# Patient Record
Sex: Male | Born: 1955
Health system: Southern US, Community
[De-identification: ages and names within clinical notes are randomized; demographics above are authoritative.]

## PROBLEM LIST (undated history)

## (undated) DIAGNOSIS — E669 Obesity, unspecified: Secondary | ICD-10-CM

## (undated) HISTORY — DX: Obesity, unspecified: E66.9

## (undated) HISTORY — PX: HERNIA REPAIR: SHX51

## (undated) HISTORY — PX: TONSILLECTOMY: SUR1361

---

## 2014-11-08 HISTORY — PX: ANKLE FRACTURE SURGERY: SHX122

## 2019-11-09 DIAGNOSIS — C801 Malignant (primary) neoplasm, unspecified: Secondary | ICD-10-CM

## 2019-11-09 HISTORY — DX: Malignant (primary) neoplasm, unspecified: C80.1

## 2019-11-27 ENCOUNTER — Other Ambulatory Visit: Payer: Self-pay

## 2019-11-27 ENCOUNTER — Encounter: Payer: Self-pay | Admitting: Medical

## 2019-11-27 ENCOUNTER — Ambulatory Visit (INDEPENDENT_AMBULATORY_CARE_PROVIDER_SITE_OTHER): Payer: Medicare Other | Admitting: Medical

## 2019-11-27 VITALS — BP 132/84 | HR 62 | Temp 97.3°F | Ht 71.0 in | Wt 230.0 lb

## 2019-11-27 DIAGNOSIS — Z7189 Other specified counseling: Secondary | ICD-10-CM | POA: Diagnosis not present

## 2019-11-27 DIAGNOSIS — R591 Generalized enlarged lymph nodes: Secondary | ICD-10-CM

## 2019-11-27 DIAGNOSIS — Z7185 Encounter for immunization safety counseling: Secondary | ICD-10-CM

## 2019-11-27 DIAGNOSIS — Z1159 Encounter for screening for other viral diseases: Secondary | ICD-10-CM

## 2019-11-27 DIAGNOSIS — Z13220 Encounter for screening for lipoid disorders: Secondary | ICD-10-CM

## 2019-11-27 DIAGNOSIS — Z136 Encounter for screening for cardiovascular disorders: Secondary | ICD-10-CM

## 2019-11-27 DIAGNOSIS — K432 Incisional hernia without obstruction or gangrene: Secondary | ICD-10-CM

## 2019-11-27 DIAGNOSIS — Z125 Encounter for screening for malignant neoplasm of prostate: Secondary | ICD-10-CM | POA: Diagnosis not present

## 2019-11-27 DIAGNOSIS — Z113 Encounter for screening for infections with a predominantly sexual mode of transmission: Secondary | ICD-10-CM

## 2019-11-27 DIAGNOSIS — Z0000 Encounter for general adult medical examination without abnormal findings: Secondary | ICD-10-CM

## 2019-11-27 DIAGNOSIS — Z23 Encounter for immunization: Secondary | ICD-10-CM | POA: Diagnosis not present

## 2019-11-27 DIAGNOSIS — Z1211 Encounter for screening for malignant neoplasm of colon: Secondary | ICD-10-CM

## 2019-11-27 DIAGNOSIS — I8393 Asymptomatic varicose veins of bilateral lower extremities: Secondary | ICD-10-CM

## 2019-11-27 NOTE — Progress Notes (Signed)
Subjective:    Jorge Gould is a 64 y.o. male who presents for Preventative Services visit , new patient today.   Primary Care Provider Nakoa Ganus, Camelia Eng, PA-C here for primary care  No primary care in years. From [place], lives in [place] now.  Current Health Care Team:  Dentist, Dr. Danella Sensing  Eye doctor, none currently  Medical Services you may have received from other than [facility] providers in the past year (date may be approximate) No  Exercise Current exercise habits: The patient has a physically strenuous job, but has no regular exercise apart from work.    Nutrition/Diet Current diet: well balanced  Depression Screen Depression screen PHQ 2/9 11/27/2019  Decreased Interest 0  Down, Depressed, Hopeless 0  PHQ - 2 Score 0    Activities of Daily Living Screen/Functional Status Survey Is the patient deaf or have difficulty hearing?: Yes(a little bit) Does the patient have difficulty seeing, even when wearing glasses/contacts?: No Does the patient have difficulty concentrating, remembering, or making decisions?: No Does the patient have difficulty walking or climbing stairs?: No Does the patient have difficulty dressing or bathing?: No Does the patient have difficulty doing errands alone such as visiting a doctor's office or shopping?: No  Can patient draw a clock face showing 3:15 o'clock, yes   Fall Risk Screen  Fall Risk  11/27/2019  Falls in the past year? 0  Number falls in past yr: 0  Injury with Fall? 0    Gait Assessment: Normal gait observed yes  Advanced directives Does patient have a [place]? No Does patient have a Living Will? No  Past Medical History:  Diagnosis Date  . Obesity     Past Surgical History:  Procedure Laterality Date  . ANKLE FRACTURE SURGERY  2016   right, trauma repair after fall down stairs  . HERNIA REPAIR     umbilical  . TONSILLECTOMY      Social History   Socioeconomic History  . Marital  status: Married    Spouse name: Not on file  . Number of children: Not on file  . Years of education: Not on file  . Highest education level: Not on file  Occupational History  . Not on file  Tobacco Use  . Smoking status: Never Smoker  . Smokeless tobacco: Never Used  Substance and Sexual Activity  . Alcohol use: Yes    Alcohol/week: 3.0 standard drinks    Types: 3 Shots of liquor per week  . Drug use: Not Currently  . Sexual activity: Not on file  Other Topics Concern  . Not on file  Social History Narrative   Engaged, plan to marry in March 2021, been together since 2014.  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   Most exercise on the job, working 7 days per week.   No children, but fiance has 5 children.    11/2019   Social Determinants of Health   Financial Resource Strain:   . Difficulty of Paying Living Expenses: Not on file  Food Insecurity:   . Worried About Charity fundraiser in the Last Year: Not on file  . Ran Out of Food in the Last Year: Not on file  Transportation Needs:   . Lack of Transportation (Medical): Not on file  . Lack of Transportation (Non-Medical): Not on file  Physical Activity:   . Days of Exercise per Week: Not on file  . Minutes of Exercise per  Session: Not on file  Stress:   . Feeling of Stress : Not on file  Social Connections:   . Frequency of Communication with Friends and Family: Not on file  . Frequency of Social Gatherings with Friends and Family: Not on file  . Attends Religious Services: Not on file  . Active Member of Clubs or Organizations: Not on file  . Attends Archivist Meetings: Not on file  . Marital Status: Not on file  Intimate Partner Violence:   . Fear of Current or Ex-Partner: Not on file  . Emotionally Abused: Not on file  . Physically Abused: Not on file  . Sexually Abused: Not on file    Family History  Problem Relation Age of Onset  . Cancer Mother        lung; former smoker  .  COPD Father        emphysema  . Diabetes Maternal Aunt   . Heart disease Maternal Uncle   . Heart disease Maternal Uncle   . Diabetes Maternal Aunt   . Stroke Neg Hx   . Hyperlipidemia Neg Hx   . Hypertension Neg Hx     No current outpatient medications on file.  Not on File  History reviewed: allergies, current medications, past family history, past medical history, past social history, past surgical history and problem list  Chronic issues discussed: none  Acute issues discussed: Has lump of left neck x 6 months, not really changing.   Thought it was from tooth infection.    Feels healthy in general  Things he has had hernia at belly button  Last Td within 5 years  Every now and then if cough, gets pain under left rib cage.    Objective:      Biometrics BP 132/84   Pulse 62   Temp (!) 97.3 F (36.3 C)   Ht 5\' 11"  (1.803 m)   Wt 230 lb (104.3 kg)   BMI 32.08 kg/m   Cognitive Testing  Alert? Yes  Normal Appearance?Yes  Oriented to person? Yes  Place? Yes   Time? Yes  Recall of three objects?  Yes  Can perform simple calculations? Yes  Displays appropriate judgment?Yes  Can read the correct time from a watch face?Yes  General appearance: alert, no distress, WD/WN, white male  Nutritional Status: Inadequate calore intake? no Loss of muscle mass? no Loss of fat beneath skin? no Localized or general edema? no Diminished functional status? no  Other pertinent exam: HEENT: normocephalic, sclerae anicteric, TMs pearly, nares patent, no discharge or erythema, pharynx normal Oral cavity: MMM, no lesions Neck: supple, enlarged 3cm firm lymph node vs mass posterior to ankle of jaw on left/post auricular, nontender, otherwise  no lymphadenopathy, no thyromegaly, no masses Heart: RRR, normal S1, S2, no murmurs Lungs: CTA bilaterally, no wheezes, rhonchi, or rales Abdomen: +bs, soft, ventral surgical port scar and mild hernia in same area, mildly tender,  otherwise  non tender, non distended, no masses, no hepatomegaly, no splenomegaly Musculoskeletal: nontender, no swelling, no obvious deformity Extremities: moderate varicose veins throughout bilat upper and lower legs, otherwise no edema, no cyanosis, no clubbing Pulses: 2+ symmetric, upper and lower extremities, normal cap refill Neurological: alert, oriented x 3, CN2-12 intact, strength normal upper extremities and lower extremities, sensation normal throughout, DTRs 2+ throughout, no cerebellar signs, gait normal Psychiatric: normal affect, behavior normal, pleasant  GU: normal male external genitalia, circ, large varicole present left, otherwise no mass, no lymphadenopathy DRE: normal anus  tone, prostate mildly enlarged, no nodules  Skin: diffuse brown macules of torso anterior and posterior, no specific worrisome lesions   EKG  indication physical, screening for heart disease, rate 55 bpm, PR 142 ms, QRS 96 ms, QTC 426 ms, Axis XVIII degrees, sinus bradycardia, no prior to compare   Assessment:   Encounter Diagnoses  Name Primary?  . Encounter for health maintenance examination in adult Yes  . Medicare annual wellness visit, initial   . Screening for heart disease   . Need for pneumococcal vaccination   . Vaccine counseling   . Screening for lipid disorders   . Screen for STD (sexually transmitted disease)   . Lymphadenopathy   . Incisional hernia, without obstruction or gangrene   . Encounter for hepatitis C screening test for low risk patient   . Screening for prostate cancer   . Screen for colon cancer   . Varicose veins of both lower extremities, unspecified whether complicated      Plan:   A preventative services visit was completed today.  During the course of the visit today, we discussed and counseled about appropriate screening and preventive services.  A health risk assessment was established today that included a review of current medications, allergies, social  history, family history, medical and preventative health history, biometrics, and preventative screenings to identify potential safety concerns or impairments.  A personalized plan was printed today for your records and use.   Personalized health advice and education was given today to reduce health risks and promote self management and wellness.  Information regarding end of life planning was discussed today.  Conditions/risks identified: Lymphadenopathy, obesity, past due on cancer screenings   Chronic problems discussed today: None  Acute problems discussed today: Lymphadenopathy-pending labs consider CT scan neck  Skin lesions-advised dermatology consult for surveillance  Obesity counseled on need for weight loss through healthy diet and exercise  Incisional hernia-mild, may need to pursue surgery if this continues to give him problems   Recommendations:  I recommend a yearly ophthalmology/optometry visit for glaucoma screening and eye checkup  I recommended a yearly dental visit for hygiene and checkup  Advanced directives - discussed nature and purpose of Advanced Directives, encouraged them to complete them if they have not done so and/or encouraged them to get [place] a copy if they have done this already.  Referrals today: Plan for colonoscopy referral pending labs  Immunizations: He reports being up-to-date on tetanus vaccine within the last 5 years. He declines influenza vaccine. Counseled on the pneumococcal vaccine.  Vaccine information sheet given.  Pneumococcal vaccine Prevnar 13 given after consent obtained.  Counseled on Covid and Shingrix vaccine.  He will consider these  Cancer screening Pending labs plan for colonoscopy referral PSA screening prostate exam done today after risk and benefits discussed Discussed skin surveillance, routine labs   Zaid was seen today for annual exam.  Diagnoses and all orders for this visit:  Encounter for health  maintenance examination in adult -     Comprehensive metabolic panel -     CBC with Differential/Platelet -     Lipid panel -     PSA -     EKG 12-Lead -     Hepatitis C antibody -     HIV Antibody (routine testing w rflx) -     RPR -     GC/Chlamydia Probe Amp  Medicare annual wellness visit, initial  Screening for heart disease -     EKG 12-Lead  Need for pneumococcal vaccination -     Pneumococcal conjugate vaccine 13-valent  Vaccine counseling  Screening for lipid disorders -     Lipid panel  Screen for STD (sexually transmitted disease) -     Hepatitis C antibody -     HIV Antibody (routine testing w rflx) -     RPR -     GC/Chlamydia Probe Amp  Lymphadenopathy -     CBC with Differential/Platelet  Incisional hernia, without obstruction or gangrene  Encounter for hepatitis C screening test for low risk patient -     Hepatitis C antibody  Screening for prostate cancer -     PSA  Screen for colon cancer -     Ambulatory referral to Gastroenterology  Varicose veins of both lower extremities, unspecified whether complicated     Medicare Attestation A preventative services visit was completed today.  During the course of the visit the patient was educated and counseled about appropriate screening and preventive services.  A health risk assessment was established with the patient that included a review of current medications, allergies, social history, family history, medical and preventative health history, biometrics, and preventative screenings to identify potential safety concerns or impairments.  A personalized plan was printed today for the patient's records and use.   Personalized health advice and education was given today to reduce health risks and promote self management and wellness.  Information regarding end of life planning was discussed today.  Dorothea Ogle, PA-C   11/27/2019

## 2019-11-28 ENCOUNTER — Encounter: Payer: Self-pay | Admitting: Medical

## 2019-11-28 ENCOUNTER — Other Ambulatory Visit: Payer: Self-pay | Admitting: Medical

## 2019-11-28 DIAGNOSIS — Z113 Encounter for screening for infections with a predominantly sexual mode of transmission: Secondary | ICD-10-CM | POA: Diagnosis not present

## 2019-11-28 DIAGNOSIS — R591 Generalized enlarged lymph nodes: Secondary | ICD-10-CM

## 2019-11-28 DIAGNOSIS — Z0000 Encounter for general adult medical examination without abnormal findings: Secondary | ICD-10-CM | POA: Diagnosis not present

## 2019-11-28 LAB — COMPREHENSIVE METABOLIC PANEL
ALT: 9 IU/L (ref 0–44)
AST: 20 IU/L (ref 0–40)
Albumin/Globulin Ratio: 1.6 (ref 1.2–2.2)
Albumin: 4.4 g/dL (ref 3.8–4.8)
Alkaline Phosphatase: 104 IU/L (ref 39–117)
BUN/Creatinine Ratio: 16 (ref 10–24)
BUN: 15 mg/dL (ref 8–27)
Bilirubin Total: 0.6 mg/dL (ref 0.0–1.2)
CO2: 23 mmol/L (ref 20–29)
Calcium: 9.7 mg/dL (ref 8.6–10.2)
Chloride: 101 mmol/L (ref 96–106)
Creatinine, Ser: 0.92 mg/dL (ref 0.76–1.27)
GFR calc Af Amer: 101 mL/min/{1.73_m2} (ref >59)
GFR calc non Af Amer: 87 mL/min/{1.73_m2} (ref >59)
Globulin, Total: 2.7 g/dL (ref 1.5–4.5)
Glucose: 86 mg/dL (ref 65–99)
Potassium: 4.4 mmol/L (ref 3.5–5.2)
Sodium: 140 mmol/L (ref 134–144)
Total Protein: 7.1 g/dL (ref 6.0–8.5)

## 2019-11-28 LAB — CBC WITH DIFFERENTIAL/PLATELET
Basophils Absolute: 0 10*3/uL (ref 0.0–0.2)
Basos: 1 %
EOS (ABSOLUTE): 0.6 10*3/uL — ABNORMAL HIGH (ref 0.0–0.4)
Eos: 9 %
Hematocrit: 40.1 % (ref 37.5–51.0)
Hemoglobin: 13.9 g/dL (ref 13.0–17.7)
Immature Grans (Abs): 0.1 10*3/uL (ref 0.0–0.1)
Immature Granulocytes: 2 %
Lymphocytes Absolute: 1.2 10*3/uL (ref 0.7–3.1)
Lymphs: 19 %
MCH: 30.1 pg (ref 26.6–33.0)
MCHC: 34.7 g/dL (ref 31.5–35.7)
MCV: 87 fL (ref 79–97)
Monocytes Absolute: 0.5 10*3/uL (ref 0.1–0.9)
Monocytes: 8 %
Neutrophils Absolute: 3.7 10*3/uL (ref 1.4–7.0)
Neutrophils: 61 %
Platelets: 176 10*3/uL (ref 150–450)
RBC: 4.62 x10E6/uL (ref 4.14–5.80)
RDW: 13.2 % (ref 11.6–15.4)
WBC: 6.1 10*3/uL (ref 3.4–10.8)

## 2019-11-28 LAB — LIPID PANEL
Chol/HDL Ratio: 6.6 ratio — ABNORMAL HIGH (ref 0.0–5.0)
Cholesterol, Total: 212 mg/dL — ABNORMAL HIGH (ref 100–199)
HDL: 32 mg/dL — ABNORMAL LOW (ref >39)
LDL Chol Calc (NIH): 152 mg/dL — ABNORMAL HIGH (ref 0–99)
Triglycerides: 154 mg/dL — ABNORMAL HIGH (ref 0–149)
VLDL Cholesterol Cal: 28 mg/dL (ref 5–40)

## 2019-11-28 LAB — PSA: Prostate Specific Ag, Serum: 1.5 ng/mL (ref 0.0–4.0)

## 2019-11-28 LAB — HEPATITIS C ANTIBODY: Hep C Virus Ab: <0.1 s/co ratio (ref 0.0–0.9)

## 2019-11-28 LAB — RPR: RPR Ser Ql: NONREACTIVE

## 2019-11-28 LAB — HIV ANTIBODY (ROUTINE TESTING W REFLEX): HIV Screen 4th Generation wRfx: NONREACTIVE

## 2019-11-29 LAB — GC/CHLAMYDIA PROBE AMP
Chlamydia trachomatis, NAA: NEGATIVE
Neisseria Gonorrhoeae by PCR: NEGATIVE

## 2019-12-10 DIAGNOSIS — R590 Localized enlarged lymph nodes: Secondary | ICD-10-CM | POA: Diagnosis not present

## 2019-12-10 DIAGNOSIS — Z1211 Encounter for screening for malignant neoplasm of colon: Secondary | ICD-10-CM | POA: Diagnosis not present

## 2019-12-10 DIAGNOSIS — E782 Mixed hyperlipidemia: Secondary | ICD-10-CM | POA: Diagnosis not present

## 2020-01-07 ENCOUNTER — Ambulatory Visit
Admission: RE | Admit: 2020-01-07 | Discharge: 2020-01-07 | Disposition: A | Discharge status: Home / Self Care | Payer: Medicare Other | Source: Ambulatory Visit | Attending: Medical | Admitting: Medical

## 2020-01-07 DIAGNOSIS — R591 Generalized enlarged lymph nodes: Secondary | ICD-10-CM

## 2020-01-07 DIAGNOSIS — R599 Enlarged lymph nodes, unspecified: Secondary | ICD-10-CM | POA: Diagnosis not present

## 2020-01-07 MED ORDER — IOPAMIDOL (ISOVUE-300) INJECTION 61%
75.0000 mL | Freq: Once | INTRAVENOUS | Status: AC | PRN
  Administered 2020-01-07: 75 mL via INTRAVENOUS

## 2020-01-08 NOTE — Progress Notes (Signed)
Pt scheduled for tomorrow morning 

## 2020-01-09 ENCOUNTER — Ambulatory Visit (INDEPENDENT_AMBULATORY_CARE_PROVIDER_SITE_OTHER): Payer: Medicare Other | Admitting: Medical

## 2020-01-09 ENCOUNTER — Other Ambulatory Visit: Payer: Self-pay

## 2020-01-09 VITALS — BP 142/78 | HR 68 | Temp 97.8°F | Ht 71.5 in | Wt 222.4 lb

## 2020-01-09 DIAGNOSIS — R9389 Abnormal findings on diagnostic imaging of other specified body structures: Secondary | ICD-10-CM | POA: Diagnosis not present

## 2020-01-09 DIAGNOSIS — Z1211 Encounter for screening for malignant neoplasm of colon: Secondary | ICD-10-CM

## 2020-01-09 DIAGNOSIS — R591 Generalized enlarged lymph nodes: Secondary | ICD-10-CM

## 2020-01-09 DIAGNOSIS — E785 Hyperlipidemia, unspecified: Secondary | ICD-10-CM | POA: Insufficient documentation

## 2020-01-09 NOTE — Patient Instructions (Signed)
Lymphadenopathy  Lymphadenopathy means that your lymph glands are swollen or larger than normal (enlarged). Lymph glands, also called lymph nodes, are collections of tissue that filter bacteria, viruses, and waste from your bloodstream. They are part of your body's disease-fighting system (immune system), which protects your body from germs. There may be different causes of lymphadenopathy, depending on where it is in your body. Some types go away on their own. Lymphadenopathy can occur anywhere that you have lymph glands, including these areas:  Neck (cervical lymphadenopathy).  Chest (mediastinal lymphadenopathy).  Lungs (hilar lymphadenopathy).  Underarms (axillary lymphadenopathy).  Groin (inguinal lymphadenopathy). When your immune system responds to germs, infection-fighting cells and fluid build up in your lymph glands. This causes some swelling and enlargement. If the lymph glands do not go back to normal after you have an infection or disease, your health care provider may do tests. These tests help to monitor your condition and find the reason why the glands are still swollen and enlarged. Follow these instructions at home:  Get plenty of rest.  Take over-the-counter and prescription medicines only as told by your health care provider. Your health care provider may recommend over-the-counter medicines for pain.  If directed, apply heat to swollen lymph glands as often as told by your health care provider. Use the heat source that your health care provider recommends, such as a moist heat pack or a heating pad. ? Place a towel between your skin and the heat source. ? Leave the heat on for 20-30 minutes. ? Remove the heat if your skin turns bright red. This is especially important if you are unable to feel pain, heat, or cold. You may have a greater risk of getting burned.  Check your affected lymph glands every day for changes. Check other lymph gland areas as told by your  health care provider. Check for changes such as: ? More swelling. ? Sudden increase in size. ? Redness or pain. ? Hardness.  Keep all follow-up visits as told by your health care provider. This is important. Contact a health care provider if you have:  Swelling that gets worse or spreads to other areas.  Problems with breathing.  Lymph glands that: ? Are still swollen after 2 weeks. ? Have suddenly gotten bigger. ? Are red, painful, or hard.  A fever or chills.  Fatigue.  A sore throat.  Pain in your abdomen.  Weight loss.  Night sweats. Get help right away if you have:  Fluid leaking from an enlarged lymph gland.  Severe pain.  Chest pain.  Shortness of breath. Summary  Lymphadenopathy means that your lymph glands are swollen or larger than normal (enlarged).  Lymph glands (also called lymph nodes) are collections of tissue that filter bacteria, viruses, and waste from the bloodstream. They are part of your body's disease-fighting system (immune system).  Lymphadenopathy can occur anywhere that you have lymph glands.  If your enlarged and swollen lymph glands do not go back to normal after you have an infection or disease, your health care provider may do tests to monitor your condition and find the reason why the glands are still swollen and enlarged.  Check your affected lymph glands every day for changes. Check other lymph gland areas as told by your health care provider. This information is not intended to replace advice given to you by your health care provider. Make sure you discuss any questions you have with your health care provider. Document Revised: 10/07/2017 Document Reviewed: 09/09/2017 Elsevier  Patient Education  2020 [place], Adult  Hodgkin lymphoma, also called Hodgkin disease, is a cancer that affects the lymphatic system. The lymphatic system is part of the body's defense system (immune system), which protects the  body from infections, germs, and diseases. Hodgkin lymphoma often affects white blood cells and the lymph nodes. Hodgkin lymphoma can spread from lymph node to lymph node and to areas of the body where there is lymph tissue, including to the center of the bones (bone marrow). Advanced Hodgkin lymphoma can spread into blood vessels and be carried almost anywhere in the body. There are two types of Hodgkin lymphoma:  Classical Hodgkin lymphoma.  Nodular lymphocyte-predominant Hodgkin lymphoma (NLPHL). What are the causes? The cause is not known. What increases the risk? You may be more likely to develop Hodgkin lymphoma if:  You are male.  You have been infected with the virus that causes mononucleosis (Epstein-Barr virus).  You have a brother or sister with Hodgkin lymphoma.  You have a weakened immune system.  You have HIV.  You have a personal or family history of autoimmune conditions such as rheumatoid arthritis, systemic lupus erythematosus (SLE), or sarcoidosis. What are the signs or symptoms? The first sign of Hodgkin lymphoma is often a painless swelling in a lymph node. The swelling may be felt in the neck, under the arm, or in the groin. Other symptoms include:  Fever.  Drenching night sweats.  Feeling tired all the time.  Cough.  Shortness of breath.  Itchy skin.  Loss of appetite.  Weight loss.  Pain in the lymph nodes after drinking alcohol. How is this diagnosed? This condition may be diagnosed based on your medical history and symptoms, a physical exam, and a procedure in which a tissue sample is removed from a lymph node and then examined under a microscope (biopsy). You may also have other tests to find out how advanced the cancer is and whether it has spread. This is called staging. These tests may include:  Blood tests.  Imaging tests, such as: ? A chest X-ray. ? A CT scan. ? An MRI. ? A PET scan.  A bone marrow biopsy to see if the disease has  spread to the bone marrow. How is this treated? Your treatment will depend on the type of Hodgkin lymphoma you have, the stage of your cancer, your age, and your overall health. Treatment usually starts with one of the following:  Chemotherapy. Chemotherapy is the use of medicines to stop or slow the growth of cancer cells.  Radiation therapy. Radiation therapy is the use of high-energy X-rays to kill cancer cells.  A combination of chemotherapy and radiation therapy. If chemotherapy and radiation therapy are not completely successful, you may have treatment with:  Targeted therapy. This treatment targets specific parts of cancer cells and the area around them to block the growth and spread of cancer.  Very high doses of chemotherapy followed by a stem cell transplant. Stem cells are cells that help your body develop new healthy blood cells after chemotherapy. Follow these instructions at home: Eating and drinking  Do not take dietary supplements or herbal medicines unless your health care provider tells you to take them. Some supplements can interfere with how well the treatment works.  Try to eat regular, healthy meals. Some of your treatments might affect your appetite. Lifestyle   Make sure you are getting enough sleep on a regular basis. Most adults need 6-8 hours of  sleep each night. During treatment, you may need more sleep.  Consider joining a cancer support group. Ask your health care provider for more information about local and online support groups.  Do not use any products that contain nicotine or tobacco, such as cigarettes, e-cigarettes, and chewing tobacco. If you need help quitting, ask your health care provider. General instructions   Take over-the-counter and prescription medicines only as directed by your health care provider.  Keep all follow-up visits as told by your health care provider. This is important during and after treatment.  Cancer treatment may  increase your risk for other cancers and infections. After treatment: ? Make sure you get all vaccinations as recommended by your health care provider. ? Have regular cancer screenings as recommended by your health care provider. Where to find more information  American Cancer Society (ACS): www.cancer.org  Leukemia and Lymphoma Society (LLS): PreviewPal.pl  National Cancer Institute ([place]): www.cancer.gov Contact a health care provider if:  You have a fever or other signs of infection.  You develop new symptoms or your old symptoms come back. Get help right away if:  You have chest pain.  You have trouble breathing. Summary  Hodgkin lymphoma, also called Hodgkin disease, is a cancer that affects the lymphatic system.  The first sign of Hodgkin lymphoma is often a painless swelling in a lymph node. The swelling may be felt in the neck, under the arm, or in the groin.  Your treatment will depend on the type of cancer cells you have, the stage of your cancer, your age, and your overall health. Treatment may include a combination of chemotherapy and radiation therapy.  Keep all follow-up visits as told by your health care provider. This is important during and after treatment. This information is not intended to replace advice given to you by your health care provider. Make sure you discuss any questions you have with your health care provider. Document Revised: 05/31/2019 Document Reviewed: 08/10/2018 Elsevier Patient Education  [place].   Non-Hodgkin Lymphoma, Adult Non-Hodgkin lymphoma (NHL) is a cancer of the lymphatic system. The lymphatic system is part of the body's defense (immune) system. It protects the body from:  Infections.  Germs.  Diseases. NHL affects a type of white blood cell called lymphocytes. There are many different types of NHL. NHL can occur in the B lymphocytes (B cells), T lymphocytes (T cells), and natural killer (NK) cells. The kind of NHL  you have depends on the type of cells it affects and how quickly it grows and spreads. What are the causes? The cause of this condition is not known. What increases the risk? You are more likely to develop this condition if:  You have a weak immune system, especially after receiving an organ from a donor (transplant).  You have an autoimmune disease like rheumatoid arthritis.  You have infections from bacteria or viruses. These include: ? Human immunodeficiency virus (HIV). ? Epstein-Barr virus.  You are over the age of 82.  You are male.  You are Caucasian.  You have been treated with high-energy beams that destroy cancer cells(radiation therapy) for other cancers. What are the signs or symptoms? Symptoms of this condition depend on the type, where it is in the body, and whether it is fast-growing or slow-growing. Some people may not have any symptoms. Common symptoms include:  Swelling of the collections of tissue that filter bacteria, viruses, and waste from the bloodstream (lymph nodes) in your neck, armpits, or groin.  Fever.  Unexplained weight loss.  Night sweats.  Tiredness (fatigue). How is this diagnosed? This condition may be diagnosed based on:  A physical exam and medical history.  Chest X-ray.  Testing of: ? Tissue from your lymph gland or bone marrow (biopsy). ? Spinal fluid (lumbar puncture or spinal tap). ? Abdominal or chest fluid. ? Blood. ? Urine.  Imaging tests, such as: ? CT scan. ? PET scan (positron emission tomography). ? MRI. Your health care provider will do more tests to determine whether the cancer has spread, where it has spread to, and how severe it is. This is called staging. You may need to have other tests to look for certain proteins or gene mutations that may help with treatment planning. How is this treated? Treatment for this condition depends on your symptoms, the type and stage of NHL, and how fast it is spreading.  Treatment may include:  Radiation therapy.  Chemotherapy. This uses medicine to destroy cancer cells.  Biological therapy. This helps to control the spread of cancer by boosting the body's immune system.  Targeted medicines to treat specific proteins or gene mutations that are present in your lymphoma.  Bone marrow or stem cell transplantation. In this procedure, you receive high doses of chemotherapy followed by a healthy bone marrow or stem cell transplant from a donor.  Participating in clinical trials to see if new (experimental) treatments are effective.  Surgery to remove the lymphoma.  Blood or platelets from a donor (transfusion). This may be done if your blood counts are low. Follow these instructions at home: Medicines  Take over-the-counter and prescription medicines only as told by your health care provider.  Do not take dietary supplements or herbal medicines unless your health care provider tells you to take them. Some supplements can interfere with how well your treatment works. Lifestyle  Get enough sleep. Most adults need 6-8 hours of sleep each night. During treatment, you may need more sleep.  Consider joining a cancer support group. Ask your health care provider for more information about local and online support groups. General instructions   Drink enough fluid to keep your urine pale yellow.  Wash your hands often with soap and water. If soap and water are not available, use hand sanitizer.  Tell your cancer care team if you develop side effects from treatment. They may be able to recommend ways to manage them.  Keep all follow-up visits as told by your health care provider. This is important. Where to find more information  American Cancer Society: www.cancer.org  Leukemia and Lymphoma Society: PreviewPal.pl  National Cancer Institute ([place]): www.cancer.gov Contact a health care provider if you:  Have new lumps under your arms, on your neck, or near  your groin.  Have painful lymph nodes.  Have nausea or vomiting.  Have constipation or diarrhea.  Have trouble urinating or painful urination.  Have a skin rash.  Have abdominal pain.  Have joint pain.  Feel dizzy or light-headed.  Have a fever or chills. Get help right away if you:  Have trouble breathing or chest pain.  Have blood in your urine or stool. Summary  Non-Hodgkin lymphoma (NHL) is a cancer of the lymphatic system. The lymphatic system is part of your body's defense (immune) system.  Treatment for this condition depends on your symptoms, the type and stage of NHL, and how fast it is spreading.  Tell your cancer care team if you develop side effects from treatment. They may be able  to recommend ways to manage them.  Contact your health care provider if you have new lumps under your arms, on your neck, or near your groin. This information is not intended to replace advice given to you by your health care provider. Make sure you discuss any questions you have with your health care provider. Document Revised: 01/11/2019 Document Reviewed: 01/11/2019 Elsevier Patient Education  [place].

## 2020-01-09 NOTE — Addendum Note (Signed)
Addended by: Edgar Frisk on: 01/09/2020 01:19 PM   Modules accepted: Orders

## 2020-01-09 NOTE — Progress Notes (Signed)
Subjective: Chief Complaint  Patient presents with  . Follow-up    discuss ct results    Here for f/u to discuss recent CT neck and hx/o enlarged lymph node x 106mo.    No recent fever, weight loss, chills.   He notes he recently got married.  Wife is 66yo and wants to have a baby.  He wants to know where to get sperm count.   Past Medical History:  Diagnosis Date  . Obesity    No current outpatient medications on file prior to visit.   No current facility-administered medications on file prior to visit.   ROS as in subjective    Objective; BP (!) 142/78   Pulse 68   Temp 97.8 F (36.6 C)   Ht 5' 11.5" (1.816 m)   Wt 222 lb 6.4 oz (100.9 kg)   SpO2 97%   BMI 30.59 kg/m   Wt Readings from Last 3 Encounters:  01/09/20 222 lb 6.4 oz (100.9 kg)  11/27/19 230 lb (104.3 kg)   BP Readings from Last 3 Encounters:  01/09/20 (!) 142/78  11/27/19 132/84   Gen: wd, wn, nad, white male Neck: supple, enlarged 3cm firm lymph node vs mass posterior to angle of jaw on left/post auricular, other enlarged nodes palpated today in bilat neck, several more notice bale left anterior and posterior triangle, otherwise neck nontender, otherwise  no thyromegaly, no masses    Assessment: Encounter Diagnoses  Name Primary?  . Lymphadenopathy Yes  . Abnormal CT scan, neck   . Screen for colon cancer   . Dyslipidemia      Plan: lymphadenopathy - discussed recent CT neck that shows possible lymphoma vs other.  discussed possible diagnoses and next steps.   Urgent referral for lymph node biopsy with [facility] intervention[facility], and we will go ahead and set up oncology consult about 1.5 -2 weeks from now awaiting pathology.  He already had consult with Dr. Benson Norway, but colonoscopy will be deferred pending lymphadenopathy workup.  Dyslipidemia - briefly discussed his lipids, but we will put this on back burner temporarily  Fertility concern - I asked him to discuss with  oncology, but briefly discussed that sperm testing would likely be done either through wife's gynecology office or urology  Gehrig was seen today for follow-up.  Diagnoses and all orders for this visit:  Lymphadenopathy -     Ambulatory referral to Oncology -     [place] FNA SOFT TISSUE; Future  Abnormal CT scan, neck -     Ambulatory referral to Oncology -     [place] FNA SOFT TISSUE; Future  Screen for colon cancer  Dyslipidemia

## 2020-01-10 ENCOUNTER — Encounter (HOSPITAL_COMMUNITY): Payer: Self-pay

## 2020-01-10 NOTE — Progress Notes (Signed)
Heinz Knuckles Dozal Male, 64 y.o., [birth year] MRN:  [id number] Phone:  [phone] Jerilynn Mages) PCP:  Carlena Hurl, PA-C Coverage:  Sherre Poot [facility] Medicare/[employer] Medicare Next Appt With Radiology ([facility]-US 2) 01/21/2020 at 1:00 PM  RE: Biospy Received: Yesterday Message Contents  Markus Daft, MD  Ernestene Mention for US guided core biopsy of cervical lymphadenopathy.   [place]   Previous Messages  ----- Message -----  From: Lenore Cordia  Sent: 01/09/2020  3:00 PM EST  To: Ir Procedure Requests  Subject: Biospy                      Procedure Requested: [place] FNA    Reason for Procedure: lymphadneopathy, possible lymphoma, see abnormal CT    Provider Requesting: Carlena Hurl  Provider Telephone: [phone]    Other Info: Rad exam in Epic

## 2020-01-17 ENCOUNTER — Other Ambulatory Visit: Payer: Self-pay | Admitting: Radiology

## 2020-01-18 ENCOUNTER — Other Ambulatory Visit: Payer: Self-pay | Admitting: Student

## 2020-01-21 ENCOUNTER — Other Ambulatory Visit: Payer: Self-pay | Admitting: Medical

## 2020-01-21 ENCOUNTER — Ambulatory Visit (HOSPITAL_COMMUNITY)
Admission: RE | Admit: 2020-01-21 | Discharge: 2020-01-21 | Disposition: A | Discharge status: Home / Self Care | Payer: Medicare Other | Source: Ambulatory Visit | Attending: Medical | Admitting: Medical

## 2020-01-21 ENCOUNTER — Other Ambulatory Visit: Payer: Self-pay

## 2020-01-21 ENCOUNTER — Encounter (HOSPITAL_COMMUNITY): Payer: Self-pay

## 2020-01-21 DIAGNOSIS — R9389 Abnormal findings on diagnostic imaging of other specified body structures: Secondary | ICD-10-CM

## 2020-01-21 DIAGNOSIS — Z79899 Other long term (current) drug therapy: Secondary | ICD-10-CM | POA: Diagnosis not present

## 2020-01-21 DIAGNOSIS — C8510 Unspecified B-cell lymphoma, unspecified site: Secondary | ICD-10-CM | POA: Insufficient documentation

## 2020-01-21 DIAGNOSIS — C8591 Non-Hodgkin lymphoma, unspecified, lymph nodes of head, face, and neck: Secondary | ICD-10-CM | POA: Diagnosis not present

## 2020-01-21 DIAGNOSIS — C8590 Non-Hodgkin lymphoma, unspecified, unspecified site: Secondary | ICD-10-CM | POA: Insufficient documentation

## 2020-01-21 DIAGNOSIS — R590 Localized enlarged lymph nodes: Secondary | ICD-10-CM | POA: Diagnosis not present

## 2020-01-21 DIAGNOSIS — R591 Generalized enlarged lymph nodes: Secondary | ICD-10-CM

## 2020-01-21 MED ORDER — SODIUM CHLORIDE 0.9 % IV SOLN: INTRAVENOUS | Status: DC

## 2020-01-21 MED ORDER — FENTANYL CITRATE (PF) 100 MCG/2ML IJ SOLN
INTRAMUSCULAR | Status: AC
  Filled 2020-01-21: qty 4

## 2020-01-21 MED ORDER — MIDAZOLAM HCL 2 MG/2ML IJ SOLN
INTRAMUSCULAR | Status: AC | PRN
  Administered 2020-01-21 (×2): 1 mg via INTRAVENOUS

## 2020-01-21 MED ORDER — HYDROCODONE-ACETAMINOPHEN 5-325 MG PO TABS: 1.0000 | ORAL_TABLET | ORAL | Status: DC | PRN

## 2020-01-21 MED ORDER — NALOXONE HCL 0.4 MG/ML IJ SOLN
INTRAMUSCULAR | Status: AC
  Filled 2020-01-21: qty 1

## 2020-01-21 MED ORDER — FLUMAZENIL 1 MG/10ML IV SOLN
INTRAVENOUS | Status: AC
  Filled 2020-01-21: qty 10

## 2020-01-21 MED ORDER — FENTANYL CITRATE (PF) 100 MCG/2ML IJ SOLN
INTRAMUSCULAR | Status: AC | PRN
  Administered 2020-01-21 (×2): 50 ug via INTRAVENOUS

## 2020-01-21 MED ORDER — MIDAZOLAM HCL 2 MG/2ML IJ SOLN
INTRAMUSCULAR | Status: AC
  Filled 2020-01-21: qty 4

## 2020-01-21 MED ORDER — LIDOCAINE HCL (PF) 1 % IJ SOLN
INTRAMUSCULAR | Status: AC
  Filled 2020-01-21: qty 30

## 2020-01-21 NOTE — Discharge Instructions (Signed)

## 2020-01-21 NOTE — H&P (Signed)
Chief Complaint: Patient was seen in consultation today for left cervical lymph node biopsy at the request of Tysinger,David S  Referring Physician(s): Carlena Hurl  Supervising Physician: Arne Cleveland  Patient Status: [facility] - Out-pt  History of Present Illness: Jorge Gould is a 64 y.o. male   Pt has noticed Left cervical LN at least 6 mo Seems slightly smaller to him now No pain; no enlargement Non smoker  Was seen for PE and was noted on exam Referred to ENT  Imaging performed 3/1: CT Neck IMPRESSION: Cervical lymphadenopathy bilaterally, left greater than right. Numerous enlarged lymph nodes are present. Findings are suggestive of lymphoma and biopsy recommended. Bilateral parotid tumors as above. Favor incidental Warthin's tumor. Parotid malignancy is a consideration however the large number of lymph nodes is more consistent with lymphoma.  Now scheduled for biopsy  Past Medical History:  Diagnosis Date  . Obesity     Past Surgical History:  Procedure Laterality Date  . ANKLE FRACTURE SURGERY  2016   right, trauma repair after fall down stairs  . HERNIA REPAIR     umbilical  . TONSILLECTOMY      Allergies: Patient has no known allergies.  Medications: Prior to Admission medications   Medication Sig Start Date End Date Taking? Authorizing Provider  Naphazoline HCl (CLEAR EYES OP) Place 1 drop into both eyes daily as needed (redness).   Yes [provider]     Family History  Problem Relation Age of Onset  . Cancer Mother        lung; former smoker  . COPD Father        emphysema  . Diabetes Maternal Aunt   . Heart disease Maternal Uncle   . Heart disease Maternal Uncle   . Diabetes Maternal Aunt   . Stroke Neg Hx   . Hyperlipidemia Neg Hx   . Hypertension Neg Hx     Social History   Socioeconomic History  . Marital status: Married    Spouse name: Not on file  . Number of children: Not on file  . Years of  education: Not on file  . Highest education level: Not on file  Occupational History  . Not on file  Tobacco Use  . Smoking status: Never Smoker  . Smokeless tobacco: Never Used  Substance and Sexual Activity  . Alcohol use: Yes    Alcohol/week: 3.0 standard drinks    Types: 3 Shots of liquor per week  . Drug use: Not Currently  . Sexual activity: Not on file  Other Topics Concern  . Not on file  Social History Narrative   Engaged, plan to marry in March 2021, been together since 2014.  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   Most exercise on the job, working 7 days per week.   No children, but fiance has 5 children.    11/2019   Social Determinants of Health   Financial Resource Strain:   . Difficulty of Paying Living Expenses:   Food Insecurity:   . Worried About Charity fundraiser in the Last Year:   . Arboriculturist in the Last Year:   Transportation Needs:   . Film/video editor (Medical):   Marland Kitchen Lack of Transportation (Non-Medical):   Physical Activity:   . Days of Exercise per Week:   . Minutes of Exercise per Session:   Stress:   . Feeling of Stress :   Social Connections:   .  Frequency of Communication with Friends and Family:   . Frequency of Social Gatherings with Friends and Family:   . Attends Religious Services:   . Active Member of Clubs or Organizations:   . Attends Archivist Meetings:   Marland Kitchen Marital Status:      Review of Systems: A 12 point ROS discussed and pertinent positives are indicated in the HPI above.  All other systems are negative.  Review of Systems  Constitutional: Negative for activity change, fatigue, fever and unexpected weight change.  HENT: Negative for sore throat and trouble swallowing.   Respiratory: Negative for cough and shortness of breath.   Cardiovascular: Negative for chest pain.  Gastrointestinal: Negative for abdominal pain.  Psychiatric/Behavioral: Negative for behavioral problems and  confusion.    Vital Signs: There were no vitals taken for this visit.  Physical Exam Vitals reviewed.  HENT:     Head: Atraumatic.     Mouth/Throat:     Mouth: Mucous membranes are moist.  Neck:     Comments: Visible and palpable L neck LAN Musculoskeletal:        General: Normal range of motion.     Cervical back: Normal range of motion.  Lymphadenopathy:     Cervical: Cervical adenopathy present.  Skin:    General: Skin is warm and dry.  Neurological:     Mental Status: He is alert and oriented to person, place, and time.  Psychiatric:        Behavior: Behavior normal.        Thought Content: Thought content normal.        Judgment: Judgment normal.     Imaging: CT SOFT TISSUE NECK W CONTRAST  Result Date: 01/07/2020 CLINICAL DATA:  Cervical adenopathy EXAM: CT NECK WITH CONTRAST TECHNIQUE: Multidetector CT imaging of the neck was performed using the standard protocol following the bolus administration of intravenous contrast. CONTRAST:  1mL ISOVUE-300 IOPAMIDOL (ISOVUE-300) INJECTION 61% COMPARISON:  None. FINDINGS: Pharynx and larynx: Normal. No mass or swelling. Salivary glands: Enhancing solid mass left parotid tail measuring 21 x 23 x 17 mm. Homogeneous enhancement with ill-defined margins. 13 mm solid enhancing mass right parotid gland just below the external auditory canal. Small periparotid lymph nodes on the right. Submandibular gland normal bilaterally. Thyroid: Negative Lymph nodes: Left occipital subcutaneous lymph node 9 x 22 mm. Multiple enlarged lymph nodes in the left neck. 1 cm left submandibular nodes. 10 mm left level 2 lymph node. Multiple posterior lymph nodes on the left measuring up to 10 mm. Cluster of left supraclavicular lymph nodes measuring up to 12 mm. Larger left supraclavicular lymph node measuring 43 x 26 mm. Multiple lymph nodes are present on the right including 1 cm level 2 level 3 level and 5 lymph nodes. Cluster of enlarged supraclavicular lymph  nodes on the right. Vascular: Normal vascular enhancement. Carotid artery calcification bilaterally. Limited intracranial: Negative Visualized orbits: Not significantly study. Mastoids and visualized paranasal sinuses: Mucosal edema right maxillary sinus. Skeleton: Degenerative changes cervical spine without acute abnormality. Dental caries with periapical lucencies around teeth. Upper chest: Lung apices clear bilaterally. Multiple 1 cm anterior mediastinal lymph nodes. Other: None IMPRESSION: Cervical lymphadenopathy bilaterally, left greater than right. Numerous enlarged lymph nodes are present. Findings are suggestive of lymphoma and biopsy recommended. Bilateral parotid tumors as above. Favor incidental Warthin's tumor. Parotid malignancy is a consideration however the large number of lymph nodes is more consistent with lymphoma. Electronically Signed   By: Franchot Gallo M.D.  On: 01/07/2020 14:33    Labs:  CBC: Recent Labs    11/27/19 1040  WBC 6.1  HGB 13.9  HCT 40.1  PLT 176    COAGS: No results for input(s): INR, APTT in the last 8760 hours.  BMP: Recent Labs    11/27/19 1040  NA 140  K 4.4  CL 101  CO2 23  GLUCOSE 86  BUN 15  CALCIUM 9.7  CREATININE 0.92  GFRNONAA 87  GFRAA 101    LIVER FUNCTION TESTS: Recent Labs    11/27/19 1040  BILITOT 0.6  AST 20  ALT 9  ALKPHOS 104  PROT 7.1  ALBUMIN 4.4    TUMOR MARKERS: No results for input(s): AFPTM, CEA, CA199, CHROMGRNA in the last 8760 hours.  Assessment and Plan:  Left neck LN visible and palpable x 6 mo Maybe smaller per pt No pain; nonsmoker CT showing numerous bilateral enlarged cervical LNs Scheduled now for biopsy of Left cervical node Risks and benefits of left cervical LN Bx was discussed with the patient and/or patient's family including, but not limited to bleeding, infection, damage to adjacent structures or low yield requiring additional tests.  All of the questions were answered and there  is agreement to proceed. Consent signed and in chart.   Thank you for this interesting consult.  I greatly enjoyed meeting VARISH GRISSON and look forward to participating in their care.  A copy of this report was sent to the requesting provider on this date.  Electronically Signed: Lavonia Drafts, PA-C 01/21/2020, 12:12 PM   I spent a total of  30 Minutes   in face to face in clinical consultation, greater than 50% of which was counseling/coordinating care for left cervical LN bx

## 2020-01-21 NOTE — Procedures (Signed)
  Procedure: [place] core L supraclav LAN   EBL:   minimal Complications:  none immediate  See full dictation in [facility].  Dillard Cannon MD Main # [phone] Pager  [phone]

## 2020-01-22 DIAGNOSIS — C8590 Non-Hodgkin lymphoma, unspecified, unspecified site: Secondary | ICD-10-CM | POA: Diagnosis not present

## 2020-01-22 DIAGNOSIS — C8591 Non-Hodgkin lymphoma, unspecified, lymph nodes of head, face, and neck: Secondary | ICD-10-CM | POA: Diagnosis not present

## 2020-01-24 ENCOUNTER — Telehealth: Payer: Self-pay | Admitting: Hematology and Oncology

## 2020-01-24 LAB — SURGICAL PATHOLOGY

## 2020-01-24 NOTE — Telephone Encounter (Signed)
Received a new pt referral from Chana Bode, PA for a new dx of non-hodgkins b-cell lymphoma. Pt cld and has been scheduled to see Dr. Lorenso Courier on 3/25 at 8am. Pt aware to arrive 15 minutes early.

## 2020-01-31 ENCOUNTER — Inpatient Hospital Stay: Payer: Medicare Other

## 2020-01-31 ENCOUNTER — Other Ambulatory Visit: Payer: Self-pay

## 2020-01-31 ENCOUNTER — Encounter: Payer: Self-pay | Admitting: Hematology and Oncology

## 2020-01-31 ENCOUNTER — Inpatient Hospital Stay: Payer: Medicare Other | Attending: Hematology and Oncology | Admitting: Hematology and Oncology

## 2020-01-31 VITALS — BP 115/73 | HR 58 | Temp 98.3°F | Resp 17 | Ht 71.5 in | Wt 222.2 lb

## 2020-01-31 DIAGNOSIS — C8291 Follicular lymphoma, unspecified, lymph nodes of head, face, and neck: Secondary | ICD-10-CM

## 2020-01-31 DIAGNOSIS — C8290 Follicular lymphoma, unspecified, unspecified site: Secondary | ICD-10-CM | POA: Insufficient documentation

## 2020-01-31 LAB — CMP (CANCER CENTER ONLY)
ALT: 8 U/L (ref 0–44)
AST: 16 U/L (ref 15–41)
Albumin: 3.8 g/dL (ref 3.5–5.0)
Alkaline Phosphatase: 100 U/L (ref 38–126)
Anion gap: 10 (ref 5–15)
BUN: 18 mg/dL (ref 8–23)
CO2: 27 mmol/L (ref 22–32)
Calcium: 9.6 mg/dL (ref 8.9–10.3)
Chloride: 105 mmol/L (ref 98–111)
Creatinine: 0.93 mg/dL (ref 0.61–1.24)
GFR, Est AFR Am: >60 mL/min (ref >60)
GFR, Estimated: >60 mL/min (ref >60)
Glucose, Bld: 122 mg/dL — ABNORMAL HIGH (ref 70–99)
Potassium: 4 mmol/L (ref 3.5–5.1)
Sodium: 142 mmol/L (ref 135–145)
Total Bilirubin: 0.9 mg/dL (ref 0.3–1.2)
Total Protein: 7.3 g/dL (ref 6.5–8.1)

## 2020-01-31 LAB — CBC WITH DIFFERENTIAL (CANCER CENTER ONLY)
Abs Immature Granulocytes: 0.12 10*3/uL — ABNORMAL HIGH (ref 0.00–0.07)
Basophils Absolute: 0 10*3/uL (ref 0.0–0.1)
Basophils Relative: 1 %
Eosinophils Absolute: 0.5 10*3/uL (ref 0.0–0.5)
Eosinophils Relative: 10 %
HCT: 37.6 % — ABNORMAL LOW (ref 39.0–52.0)
Hemoglobin: 12.2 g/dL — ABNORMAL LOW (ref 13.0–17.0)
Immature Granulocytes: 3 %
Lymphocytes Relative: 18 %
Lymphs Abs: 0.8 10*3/uL (ref 0.7–4.0)
MCH: 29.8 pg (ref 26.0–34.0)
MCHC: 32.4 g/dL (ref 30.0–36.0)
MCV: 91.7 fL (ref 80.0–100.0)
Monocytes Absolute: 0.4 10*3/uL (ref 0.1–1.0)
Monocytes Relative: 8 %
Neutro Abs: 2.8 10*3/uL (ref 1.7–7.7)
Neutrophils Relative %: 60 %
Platelet Count: 158 10*3/uL (ref 150–400)
RBC: 4.1 MIL/uL — ABNORMAL LOW (ref 4.22–5.81)
RDW: 15.8 % — ABNORMAL HIGH (ref 11.5–15.5)
WBC Count: 4.6 10*3/uL (ref 4.0–10.5)
nRBC: 0 % (ref 0.0–0.2)

## 2020-01-31 LAB — HEPATITIS B CORE ANTIBODY, IGM: Hep B C IgM: NONREACTIVE

## 2020-01-31 LAB — SAVE SMEAR(SSMR), FOR PROVIDER SLIDE REVIEW

## 2020-01-31 LAB — HEPATITIS B SURFACE ANTIGEN: Hepatitis B Surface Ag: NONREACTIVE

## 2020-01-31 LAB — LACTATE DEHYDROGENASE: LDH: 245 U/L — ABNORMAL HIGH (ref 98–192)

## 2020-01-31 LAB — HEPATITIS B SURFACE ANTIBODY,QUALITATIVE: Hep B S Ab: NONREACTIVE

## 2020-01-31 LAB — HEPATITIS C ANTIBODY: HCV Ab: NONREACTIVE

## 2020-01-31 LAB — URIC ACID: Uric Acid, Serum: 7.5 mg/dL (ref 3.7–8.6)

## 2020-01-31 NOTE — Progress Notes (Signed)
[facility] [phone]   Fax:([phone] NOTE  Patient Care Team: Caryl Ada as PCP - General (Family Medicine)  Hematological/Oncological History # Newly Diagnosed Low Grade B Cell Lymphoma, Consistent with Follicular Lymphoma  1) 01/07/2020: CT Neck W contrast showed cervical lymphadenopathy bilaterally, left greater than right. Numerous enlarged lymph nodes are present 2)  01/22/2020: [place] Core biopsy performed which revealed overall features consistent with involvement by non-Hodgkin's B-cell lymphoma and the morphologic and phenotypic findings favor follicular lymphoma.  3) 01/31/2020: establish care with Dr. Lorenso Courier   CHIEF COMPLAINTS/PURPOSE OF CONSULTATION:  "Newly Diagnosed Low Grade B Cell Lymphoma "  HISTORY OF PRESENTING ILLNESS:  Jorge Gould 64 y.o. male with no remarkable past medical history who presents for evaluation of newly diagnosed follicular lymphoma.   On review of the previous records Jorge Gould underwent a CT scan of the neck with contrast on 01/07/2020.  At that time it showed cervical lymphadenopathy bilaterally left greater than right.  There are numerous enlarged lymph nodes present.  On 01/22/2020 the patient underwent a [place] core biopsy revealed overall features consistent with involvement by a non-Hodgkin's B-cell lymphoma, most supportive of follicular lymphoma.  Due to this new diagnosis of follicular lymphoma the patient was referred to hematology for further evaluation management.  On exam today Jorge Gould notes that he has felt this lump on the left side of his neck for approximately 6 months time.  Due to insurance issues he was not able to have this checked out until recently.  The patient reports that these lymph nodes initially started smaller and continues to grow until about 2 months ago.  For the last 2 months he notes that the lymph nodes have been stable in size.  During that time.  He has had  intentional weight loss.  He notes that he has changed his diet and is eating less tortillas and attempting a holistic treatment for weight loss.  He notes he has lost 10 pounds in that period of time.  He notes that he is not currently taking any medications and has no other past medical history.    On further discussion he reports that he has had no fevers, chills, sweats, nausea, vomiting or diarrhea.  He has had no other palpable lymphadenopathy other than the lymph nodes in his neck.  He denies any under his arm, or in his groin.  He reports that he has not noticed any enlargement of his abdomen or abdominal distention, or abdominal pain.  He also notes he has not had any issues recently with fatigue, dizziness, or changes in appetite.  He does have some occasional shortness of breath when walking upstairs, but otherwise has relatively good exercise tolerance.  He is a never smoker and only occasionally consumes alcohol.  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  He has no limitations in his day-to-day living.  A full 10 point ROS is listed below.  MEDICAL HISTORY:  Past Medical History:  Diagnosis Date   Obesity     SURGICAL HISTORY: Past Surgical History:  Procedure Laterality Date   ANKLE FRACTURE SURGERY  2016   right, trauma repair after fall down stairs   HERNIA REPAIR     umbilical   TONSILLECTOMY      SOCIAL HISTORY: Social History   Socioeconomic History   Marital status: Married    Spouse name:  Not on file   Number of children: Not on file   Years of education: Not on file   Highest education level: Not on file  Occupational History   Not on file  Tobacco Use   Smoking status: Never Smoker   Smokeless tobacco: Never Used  Substance and Sexual Activity   Alcohol use: Yes    Alcohol/week: 3.0 standard drinks    Types: 3 Shots of liquor per week   Drug use: Not Currently   Sexual activity: Not on  file  Other Topics Concern   Not on file  Social History Narrative   Engaged, plan to marry in March 2021, been together since 2014.  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   Most exercise on the job, working 7 days per week.   No children, but fiance has 5 children.    11/2019   Social Determinants of Health   Financial Resource Strain:    Difficulty of Paying Living Expenses:   Food Insecurity:    Worried About Charity fundraiser in the Last Year:    Arboriculturist in the Last Year:   Transportation Needs:    Film/video editor (Medical):    Lack of Transportation (Non-Medical):   Physical Activity:    Days of Exercise per Week:    Minutes of Exercise per Session:   Stress:    Feeling of Stress :   Social Connections:    Frequency of Communication with Friends and Family:    Frequency of Social Gatherings with Friends and Family:    Attends Religious Services:    Active Member of Clubs or Organizations:    Attends Music therapist:    Marital Status:   Intimate Partner Violence:    Fear of Current or Ex-Partner:    Emotionally Abused:    Physically Abused:    Sexually Abused:     FAMILY HISTORY: Family History  Problem Relation Age of Onset   Cancer Mother        lung; former smoker   COPD Father        emphysema   Diabetes Maternal Aunt    Heart disease Maternal Uncle    Heart disease Maternal Uncle    Diabetes Maternal Aunt    Stroke Neg Hx    Hyperlipidemia Neg Hx    Hypertension Neg Hx     ALLERGIES:  has No Known Allergies.  MEDICATIONS:  Current Outpatient Medications  Medication Sig Dispense Refill   Naphazoline HCl (CLEAR EYES OP) Place 1 drop into both eyes daily as needed (redness).     No current facility-administered medications for this visit.    REVIEW OF SYSTEMS:   Constitutional: ( - ) fevers, ( - )  chills , ( - ) night sweats Eyes: ( - ) blurriness of vision, ( - ) double  vision, ( - ) watery eyes Ears, nose, mouth, throat, and face: ( - ) mucositis, ( - ) sore throat Respiratory: ( - ) cough, ( - ) dyspnea, ( - ) wheezes Cardiovascular: ( - ) palpitation, ( - ) chest discomfort, ( - ) lower extremity swelling Gastrointestinal:  ( - ) nausea, ( - ) heartburn, ( - ) change in bowel habits Skin: ( - ) abnormal skin rashes Lymphatics: ( - ) new lymphadenopathy, ( - ) easy bruising Neurological: ( - ) numbness, ( - ) tingling, ( - ) new weaknesses Behavioral/Psych: ( - )  mood change, ( - ) new changes  All other systems were reviewed with the patient and are negative.  PHYSICAL EXAMINATION: ECOG PERFORMANCE STATUS: 0 - Asymptomatic  Vitals:   01/31/20 0824  BP: 115/73  Pulse: (!) 58  Resp: 17  Temp: 98.3 F (36.8 C)  SpO2: 100%   Filed Weights   01/31/20 0824  Weight: 222 lb 3.2 oz (100.8 kg)    GENERAL: well appearing middle aged Caucasian male in NAD  SKIN: skin color, texture, turgor are normal, no rashes or significant lesions EYES: conjunctiva are pink and non-injected, sclera clear NECK: supple, non-tender LYMPH:  palpable lymphadenopathy in the cervical (L >R) cervical chain, with none palpable in the  Axillary lymph nodes.  LUNGS: clear to auscultation and percussion with normal breathing effort HEART: regular rate & rhythm and no murmurs and no lower extremity edema ABDOMEN: soft, non-tender, non-distended, normal bowel sounds. No HSM appreciated.  Musculoskeletal: no cyanosis of digits and no clubbing  PSYCH: alert & oriented x 3, fluent speech NEURO: no focal motor/sensory deficits  LABORATORY DATA:  I have reviewed the data as listed CBC Latest Ref Rng & Units 11/27/2019  WBC 3.4 - 10.8 x10E3/uL 6.1  Hemoglobin 13.0 - 17.7 g/dL 13.9  Hematocrit 37.5 - 51.0 % 40.1  Platelets 150 - 450 x10E3/uL 176    CMP Latest Ref Rng & Units 11/27/2019  Glucose 65 - 99 mg/dL 86  BUN 8 - 27 mg/dL 15  Creatinine 0.76 - 1.27 mg/dL 0.92  Sodium  134 - 144 mmol/L 140  Potassium 3.5 - 5.2 mmol/L 4.4  Chloride 96 - 106 mmol/L 101  CO2 20 - 29 mmol/L 23  Calcium 8.6 - 10.2 mg/dL 9.7  Total Protein 6.0 - 8.5 g/dL 7.1  Total Bilirubin 0.0 - 1.2 mg/dL 0.6  Alkaline Phos 39 - 117 IU/L 104  AST 0 - 40 IU/L 20  ALT 0 - 44 IU/L 9    PATHOLOGY: Surgical Pathology  CASE: [id number]  PATIENT: Jorge Gould  Flow Pathology Report   Clinical history: L supraclav LAB   DIAGNOSIS:   -Monoclonal B-cell population identified  -See comment-   COMMENT:   The findings are consistent with non-Hodgkin's B-cell lymphoma and  correlate with the morphologic impression of follicular lymphoma seen in  the lymph node (MCS [id number])    GATING AND PHENOTYPIC ANALYSIS:   Gated population: Flow cytometric immunophenotyping is performed using  antibodies to the antigens listed in the table below. Electronic gates  are placed around a cell cluster displaying light scatter properties  corresponding to: lymphocytes   Abnormal Cells in sample: 46%   Phenotype of Abnormal Cells: CD10, CD19, CD20, CD200, CD38, Kappa            Lymphoid Antigens    Myeloid Antigens  Miscellaneous  CD2 NEG CD10 POS CD11b   ND  CD45 POS  CD3 NEG CD19 POS CD11c   ND  HLA-Dr  ND  CD4 NEG CD20 POS CD13 ND  CD34 NEG  CD5 NEG CD22 ND  CD14 ND  CD38 POS  CD7 NEG CD79b   ND  CD15 ND  CD138   ND  CD8 NEG CD103   ND  CD16 ND  TdT ND  CD25 ND  CD200   POS CD33 ND  CD123   ND  TCRab   ND  sKappa  POS CD64 ND  CD41 ND  TCRgd   NEG sLambda  NEG CD117   ND  CD61  ND  CD56 NEG cKappa  ND  MPO ND  CD71 ND  CD57 ND  cLambda  ND    CD235aND   GROSS DESCRIPTION:   Reference tissue case [id number].     Final Diagnosis performed by Susanne Greenhouse, MD.  Electronically signed  01/24/2020  Technical and / or Professional components performed at [facility], [place] [address], [place], [place] [address].  The above tests were developed and their performance characteristics  determined by the [facility] system for the physical and  immunophenotypic characterization of cell populations. They have not  been cleared by the U.S. Food and Drug administration. The FDA has  determined that such clearance or approval is not necessary. This test  isused for clinical purposes. It should not be regarded as  investigational or for research   RADIOGRAPHIC STUDIES: I have personally reviewed the radiological images as listed and agreed with the findings in the report:cervical lymphadenopathy bilaterally, left >right.    CT SOFT TISSUE NECK W CONTRAST  Result Date: 01/07/2020 CLINICAL DATA:  Cervical adenopathy EXAM: CT NECK WITH CONTRAST TECHNIQUE: Multidetector CT imaging of the neck was performed using the standard protocol following the bolus administration of intravenous contrast. CONTRAST:  61m ISOVUE-300 IOPAMIDOL (ISOVUE-300) INJECTION 61% COMPARISON:  None. FINDINGS: Pharynx and larynx: Normal. No mass or swelling. Salivary glands: Enhancing solid mass left parotid tail measuring 21 x 23 x 17 mm. Homogeneous enhancement with ill-defined margins. 13 mm solid enhancing mass right parotid gland just below the external auditory canal. Small periparotid lymph nodes on the right. Submandibular gland normal bilaterally. Thyroid: Negative Lymph nodes: Left occipital subcutaneous lymph node 9 x 22 mm. Multiple enlarged lymph nodes in the left neck. 1 cm left submandibular nodes. 10 mm left level 2 lymph node. Multiple posterior lymph nodes on the left measuring up to 10 mm. Cluster of left supraclavicular lymph nodes measuring up to 12 mm. Larger left supraclavicular lymph node measuring 43 x 26 mm. Multiple lymph nodes are present on the right including 1 cm level 2 level 3 level and 5 lymph nodes. Cluster of enlarged supraclavicular lymph nodes on the right. Vascular: Normal  vascular enhancement. Carotid artery calcification bilaterally. Limited intracranial: Negative Visualized orbits: Not significantly study. Mastoids and visualized paranasal sinuses: Mucosal edema right maxillary sinus. Skeleton: Degenerative changes cervical spine without acute abnormality. Dental caries with periapical lucencies around teeth. Upper chest: Lung apices clear bilaterally. Multiple 1 cm anterior mediastinal lymph nodes. Other: None IMPRESSION: Cervical lymphadenopathy bilaterally, left greater than right. Numerous enlarged lymph nodes are present. Findings are suggestive of lymphoma and biopsy recommended. Bilateral parotid tumors as above. Favor incidental Warthin's tumor. Parotid malignancy is a consideration however the large number of lymph nodes is more consistent with lymphoma. Electronically Signed   By: CFranchot GalloM.D.   On: 01/07/2020 14:33   [place] BI[place] (LYMPH NODES)  Result Date: 01/21/2020 INDICATION: Cervical and supraclavicular adenopathy. No known primary. Biopsy requested EXAM: ULTRASOUND GUIDED CORE BIOPSY OF LEFT SUPRACLAVICULAR ADENOPATHY MEDICATIONS: No antibiotics indicated ANESTHESIA/SEDATION: Intravenous Fentanyl 1033m and Versed 39m6mere administered as conscious sedation during continuous monitoring of the patient's level of consciousness and physiological / cardiorespiratory status by the radiology RN, with a total moderate sedation time of 10 minutes. PROCEDURE: The procedure, risks, benefits, and alternatives were explained to the patient. Questions regarding the procedure were encouraged and answered. The patient understands and consents to the procedure. The dominant left supraclavicular adenopathy was localized and an  appropriate skin entry site was determined and marked. The operative field was prepped with chlorhexidine in a sterile fashion, and a sterile drape was applied covering the operative field. A sterile gown and sterile gloves were used for the  procedure. Local anesthesia was provided with 1% Lidocaine. Under real-time ultrasound guidance, multiple 18 gauge core biopsy samples were obtained, submitted in saline to surgical pathology. Postprocedure scan shows no hemorrhage or other apparent complication. The patient tolerated the procedure well. COMPLICATIONS: None immediate. FINDINGS: Left supraclavicular adenopathy was localized. Representative core biopsy samples obtained under ultrasound guidance. IMPRESSION: 1. Technically successful ultrasound-guided core biopsy, left supraclavicular adenopathy. Electronically Signed   By: Lucrezia Europe M.D.   On: 01/21/2020 16:12    ASSESSMENT & PLAN ELDER DAVIDIAN 64 y.o. male with no remarkable past medical history who presents for evaluation of newly diagnosed follicular lymphoma.  After discussion with the patient and review of the pathology and imaging the patient's findings are most consistent with a low-grade B-cell lymphoma, consistent with follicular lymphoma.  In order to complete the patient's work-up today we will order a PET CT scan to further evaluate the extent of the disease.  We will calculate if the [employer] once we have full imaging studies available.  Additionally we will collect viral serologies with hepatitis B and hepatitis C as well as a baseline uric acid and LDH.  We have ordered a follicular lymphoma panel in order to help confirm the diagnosis of follicular lymphoma.  Today the patient was accompanied by his daughter.  I spoke with the patient and his daughter about the nature of follicular lymphoma and the treatment options moving forward.  I discussed the stage I disease we could consider radiation therapy alone and that if he had a stage II or higher disease we could consider observation before systemic treatment.  I noted that chemotherapy options are generally effective, but that we wish to hold off on using them until it is absolutely necessary.  I also mentioned that a bone  marrow biopsy may be necessary for staging purposes prior to consideration of I-S RT.  Overall I expressed to Jorge Gould the typically favorable prognosis of this type of lymphoma and the treatment modalities moving forward.  Patient and his daughter voiced understanding of these findings.  FLIPI Score: Pending (need staging imaging to complete)  #Newly Diagnosed Low Grade B Cell Lymphoma, Consistent with Follicular Lymphoma  --in order to complete the patient's staging, will order a PET CT scan to further evaluate the extent of the disease --FLIPI score pending the results of the above imaging --today will order baseline CMP, CBC, uric acid and LDH --viral testing with Hepatitis B, Hepatitis C -- will order the Follicular Lymphoma Panel (to include t(14:18)). Additionally will collect beta 2 microglobulin, SPEP, and SFLC --RTC pending the results of the above studies. Will put placeholder return visit in 3 months time.   #Onco-Fertility --patient notes he would want to sperm bank prior to starting treatment --if it becomes clear that treatment is necessary we will refer the patient   No orders of the defined types were placed in this encounter.   All questions were answered. The patient knows to call the clinic with any problems, questions or concerns.  A total of more than 60 minutes were spent on this encounter and over half of that time was spent on counseling and coordination of care as outlined above.   Ledell Peoples, MD Department of Hematology/Oncology [facility]  [place] [facility] Phone: [phone] Pager: [phone] Email: [email or web address]  01/31/2020 8:33 AM

## 2020-02-01 ENCOUNTER — Telehealth: Payer: Self-pay | Admitting: Hematology and Oncology

## 2020-02-01 LAB — KAPPA/LAMBDA LIGHT CHAINS
Kappa free light chain: 38.6 mg/L — ABNORMAL HIGH (ref 3.3–19.4)
Kappa, lambda light chain ratio: 1.59 (ref 0.26–1.65)
Lambda free light chains: 24.3 mg/L (ref 5.7–26.3)

## 2020-02-01 LAB — BETA 2 MICROGLOBULIN, SERUM: Beta-2 Microglobulin: 3.2 mg/L — ABNORMAL HIGH (ref 0.6–2.4)

## 2020-02-01 NOTE — Telephone Encounter (Signed)
Scheduled per los. Called, not able to leave msg. Mailed printout  

## 2020-02-04 LAB — PROTEIN ELECTROPHORESIS, SERUM
A/G Ratio: 1 (ref 0.7–1.7)
Albumin ELP: 3.4 g/dL (ref 2.9–4.4)
Alpha-1-Globulin: 0.3 g/dL (ref 0.0–0.4)
Alpha-2-Globulin: 0.8 g/dL (ref 0.4–1.0)
Beta Globulin: 1.1 g/dL (ref 0.7–1.3)
Gamma Globulin: 1.3 g/dL (ref 0.4–1.8)
Globulin, Total: 3.5 g/dL (ref 2.2–3.9)
Total Protein ELP: 6.9 g/dL (ref 6.0–8.5)

## 2020-02-11 DIAGNOSIS — Z1211 Encounter for screening for malignant neoplasm of colon: Secondary | ICD-10-CM | POA: Diagnosis not present

## 2020-02-11 DIAGNOSIS — R590 Localized enlarged lymph nodes: Secondary | ICD-10-CM | POA: Diagnosis not present

## 2020-02-12 ENCOUNTER — Ambulatory Visit (HOSPITAL_COMMUNITY)
Admission: RE | Admit: 2020-02-12 | Discharge: 2020-02-12 | Disposition: A | Discharge status: Home / Self Care | Payer: Medicare Other | Source: Ambulatory Visit | Attending: Hematology and Oncology | Admitting: Hematology and Oncology

## 2020-02-12 ENCOUNTER — Other Ambulatory Visit: Payer: Self-pay

## 2020-02-12 DIAGNOSIS — C8291 Follicular lymphoma, unspecified, lymph nodes of head, face, and neck: Secondary | ICD-10-CM | POA: Insufficient documentation

## 2020-02-12 DIAGNOSIS — C8290 Follicular lymphoma, unspecified, unspecified site: Secondary | ICD-10-CM | POA: Diagnosis not present

## 2020-02-12 LAB — GLUCOSE, CAPILLARY: Glucose-Capillary: 103 mg/dL — ABNORMAL HIGH (ref 70–99)

## 2020-02-12 MED ORDER — FLUDEOXYGLUCOSE F - 18 (FDG) INJECTION
11.1000 | Freq: Once | INTRAVENOUS | Status: AC | PRN
  Administered 2020-02-12: 07:00:00 11.1 via INTRAVENOUS

## 2020-02-13 LAB — LYMPHOMA, FOLLICULAR (GENPATH)

## 2020-02-21 ENCOUNTER — Telehealth: Payer: Self-pay | Admitting: Hematology and Oncology

## 2020-02-21 NOTE — Telephone Encounter (Signed)
Called Mr. Clouse to discuss the results of his PET CT scan and to schedule him for an appointment for next week to discuss management moving forward.  I spoke with another member of his household who noted he was occupied and requested a call back in 30 minutes.  I called back 30 minutes later and the line went to voicemail which had not been set up yet.  On the previous call I did provide our contact number here requesting that he call us if we were not able to touch base.  I will dissipate his phone call in the near future and can attempt again shortly.  In the interim I have placed a request for a follow-up visit to take place next week.  Ledell Peoples, MD Department of Hematology/Oncology [place] at [facility] Phone: [phone] Pager: [phone] Email: [email or web address]

## 2020-02-22 ENCOUNTER — Telehealth: Payer: Self-pay | Admitting: Hematology and Oncology

## 2020-02-22 NOTE — Telephone Encounter (Signed)
Scheduled per sch msg. Called and spoke with patient. Confirmed appt  

## 2020-02-29 ENCOUNTER — Other Ambulatory Visit: Payer: Self-pay | Admitting: Hematology and Oncology

## 2020-02-29 ENCOUNTER — Encounter: Payer: Self-pay | Admitting: Hematology and Oncology

## 2020-02-29 ENCOUNTER — Inpatient Hospital Stay: Payer: Medicare Other | Attending: Hematology and Oncology | Admitting: Hematology and Oncology

## 2020-02-29 ENCOUNTER — Inpatient Hospital Stay: Payer: Medicare Other

## 2020-02-29 ENCOUNTER — Other Ambulatory Visit: Payer: Self-pay

## 2020-02-29 VITALS — BP 153/91 | HR 60 | Temp 98.2°F | Resp 20 | Ht 71.0 in | Wt 222.1 lb

## 2020-02-29 DIAGNOSIS — C8291 Follicular lymphoma, unspecified, lymph nodes of head, face, and neck: Secondary | ICD-10-CM

## 2020-02-29 DIAGNOSIS — Z79899 Other long term (current) drug therapy: Secondary | ICD-10-CM | POA: Insufficient documentation

## 2020-02-29 DIAGNOSIS — C8290 Follicular lymphoma, unspecified, unspecified site: Secondary | ICD-10-CM | POA: Insufficient documentation

## 2020-02-29 DIAGNOSIS — C8293 Follicular lymphoma, unspecified, intra-abdominal lymph nodes: Secondary | ICD-10-CM | POA: Diagnosis not present

## 2020-02-29 LAB — CMP (CANCER CENTER ONLY)
ALT: 7 U/L (ref 0–44)
AST: 15 U/L (ref 15–41)
Albumin: 3.6 g/dL (ref 3.5–5.0)
Alkaline Phosphatase: 104 U/L (ref 38–126)
Anion gap: 12 (ref 5–15)
BUN: 16 mg/dL (ref 8–23)
CO2: 26 mmol/L (ref 22–32)
Calcium: 9.1 mg/dL (ref 8.9–10.3)
Chloride: 103 mmol/L (ref 98–111)
Creatinine: 1.05 mg/dL (ref 0.61–1.24)
GFR, Est AFR Am: >60 mL/min (ref >60)
GFR, Estimated: >60 mL/min (ref >60)
Glucose, Bld: 118 mg/dL — ABNORMAL HIGH (ref 70–99)
Potassium: 4.1 mmol/L (ref 3.5–5.1)
Sodium: 141 mmol/L (ref 135–145)
Total Bilirubin: 0.7 mg/dL (ref 0.3–1.2)
Total Protein: 7.4 g/dL (ref 6.5–8.1)

## 2020-02-29 LAB — CBC WITH DIFFERENTIAL (CANCER CENTER ONLY)
Abs Immature Granulocytes: 0.08 10*3/uL — ABNORMAL HIGH (ref 0.00–0.07)
Basophils Absolute: 0 10*3/uL (ref 0.0–0.1)
Basophils Relative: 1 %
Eosinophils Absolute: 0.5 10*3/uL (ref 0.0–0.5)
Eosinophils Relative: 12 %
HCT: 34.5 % — ABNORMAL LOW (ref 39.0–52.0)
Hemoglobin: 11.2 g/dL — ABNORMAL LOW (ref 13.0–17.0)
Immature Granulocytes: 2 %
Lymphocytes Relative: 23 %
Lymphs Abs: 1 10*3/uL (ref 0.7–4.0)
MCH: 29.3 pg (ref 26.0–34.0)
MCHC: 32.5 g/dL (ref 30.0–36.0)
MCV: 90.3 fL (ref 80.0–100.0)
Monocytes Absolute: 0.4 10*3/uL (ref 0.1–1.0)
Monocytes Relative: 10 %
Neutro Abs: 2.3 10*3/uL (ref 1.7–7.7)
Neutrophils Relative %: 52 %
Platelet Count: 151 10*3/uL (ref 150–400)
RBC: 3.82 MIL/uL — ABNORMAL LOW (ref 4.22–5.81)
RDW: 15.2 % (ref 11.5–15.5)
WBC Count: 4.4 10*3/uL (ref 4.0–10.5)
nRBC: 0 % (ref 0.0–0.2)

## 2020-02-29 LAB — LACTATE DEHYDROGENASE: LDH: 249 U/L — ABNORMAL HIGH (ref 98–192)

## 2020-02-29 MED ORDER — ONDANSETRON HCL 8 MG PO TABS: 8.0000 mg | ORAL_TABLET | Freq: Three times a day (TID) | ORAL | 1 refills | Status: DC | PRN

## 2020-02-29 NOTE — Progress Notes (Signed)
Fax'd referral to [facility] for fertility preservation.

## 2020-02-29 NOTE — Progress Notes (Signed)
[place] [phone]   [phone]  PROGRESS NOTE  Patient Care Team: Tysinger, Leward Quan as PCP - General (Family Medicine)  Hematological/Oncological History # Newly Diagnosed Follicular Lymphoma. Stage III 1) 01/07/2020: CT Neck W contrast showed cervical lymphadenopathy bilaterally, left greater than right. Numerous enlarged lymph nodes are present 2)  01/22/2020: [place] Core biopsy performed which revealed overall features consistent with involvement by non-Hodgkin's B-cell lymphoma and the morphologic and phenotypic findings favor follicular lymphoma.  3) 01/31/2020: establish care with Dr. Lorenso Courier  4) 02/12/2020: PET CT scan demonstrates bulky adenopathy in the neck, chest, abdomen and pelvis and diffuse skeletal uptake is suspicious for but not diagnostic of marrow involvement.  Interval History:  Jorge Gould 64 y.o. male with medical history significant for newly diagnosed follicular lymphoma who presents for a follow up visit. The patient's last visit was on 01/31/2020 at which time he established care. In the interim since the last visit the patient has had a PET CT performed which demonstrates bulky disease.   On exam today Mr. [name] notes that he has not had any issues with fevers, chills, sweats, nausea, vomiting or diarrhea.  He reports that his weight has been stable since our last visit at the end of March.  He notes that he does occasionally have a sensation of feeling bloated in his abdomen, but that he is always had some extra weight around his midsection.  He notes that the enlarged lymph node in his left neck has potentially decreased in size, and that it is nontender.  He has not noticed any new lymphadenopathy in the interim.  He reports that he has no shortness of breath and his energy level is at baseline.  A full 10 point ROS is listed below.  Today the patient was accompanied by his wife and we discussed the treatment options moving  forward as well as the prognosis and the overall plan.  This is detailed below in the assessment and plan section.  MEDICAL HISTORY:  Past Medical History:  Diagnosis Date  . Obesity     SURGICAL HISTORY: Past Surgical History:  Procedure Laterality Date  . ANKLE FRACTURE SURGERY  2016   right, trauma repair after fall down stairs  . HERNIA REPAIR     umbilical  . TONSILLECTOMY      SOCIAL HISTORY: Social History   Socioeconomic History  . Marital status: Married    Spouse name: Not on file  . Number of children: Not on file  . Years of education: Not on file  . Highest education level: Not on file  Occupational History  . Not on file  Tobacco Use  . Smoking status: Never Smoker  . Smokeless tobacco: Never Used  Substance and Sexual Activity  . Alcohol use: Yes    Alcohol/week: 3.0 standard drinks    Types: 3 Shots of liquor per week  . Drug use: Not Currently  . Sexual activity: Not on file  Other Topics Concern  . Not on file  Social History Narrative   Engaged, plan to marry in March 2021, been together since 2014.  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   Most exercise on the job, working 7 days per week.   No children, but fiance has 5 children.    11/2019   Social Determinants of Health   Financial Resource Strain:   . Difficulty of Paying Living Expenses:   Food Insecurity:   .  Worried About Charity fundraiser in the Last Year:   . Arboriculturist in the Last Year:   Transportation Needs:   . Film/video editor (Medical):   Marland Kitchen Lack of Transportation (Non-Medical):   Physical Activity:   . Days of Exercise per Week:   . Minutes of Exercise per Session:   Stress:   . Feeling of Stress :   Social Connections:   . Frequency of Communication with Friends and Family:   . Frequency of Social Gatherings with Friends and Family:   . Attends Religious Services:   . Active Member of Clubs or Organizations:   . Attends Archivist  Meetings:   Marland Kitchen Marital Status:   Intimate Partner Violence:   . Fear of Current or Ex-Partner:   . Emotionally Abused:   Marland Kitchen Physically Abused:   . Sexually Abused:     FAMILY HISTORY: Family History  Problem Relation Age of Onset  . Cancer Mother        lung; former smoker  . COPD Father        emphysema  . Diabetes Maternal Aunt   . Heart disease Maternal Uncle   . Heart disease Maternal Uncle   . Diabetes Maternal Aunt   . Stroke Neg Hx   . Hyperlipidemia Neg Hx   . Hypertension Neg Hx     ALLERGIES:  has No Known Allergies.  MEDICATIONS:  Current Outpatient Medications  Medication Sig Dispense Refill  . Naphazoline HCl (CLEAR EYES OP) Place 1 drop into both eyes daily as needed (redness).    . ondansetron (ZOFRAN) 8 MG tablet Take 1 tablet (8 mg total) by mouth every 8 (eight) hours as needed for nausea or vomiting. 20 tablet 1   No current facility-administered medications for this visit.    REVIEW OF SYSTEMS:   Constitutional: ( - ) fevers, ( - )  chills , ( - ) night sweats Eyes: ( - ) blurriness of vision, ( - ) double vision, ( - ) watery eyes Ears, nose, mouth, throat, and face: ( - ) mucositis, ( - ) sore throat Respiratory: ( - ) cough, ( - ) dyspnea, ( - ) wheezes Cardiovascular: ( - ) palpitation, ( - ) chest discomfort, ( - ) lower extremity swelling Gastrointestinal:  ( - ) nausea, ( - ) heartburn, ( - ) change in bowel habits Skin: ( - ) abnormal skin rashes Lymphatics: ( - ) new lymphadenopathy, ( - ) easy bruising Neurological: ( - ) numbness, ( - ) tingling, ( - ) new weaknesses Behavioral/Psych: ( - ) mood change, ( - ) new changes  All other systems were reviewed with the patient and are negative.  PHYSICAL EXAMINATION: ECOG PERFORMANCE STATUS: 1 - Symptomatic but completely ambulatory  Vitals:   02/29/20 0821  BP: (!) 153/91  Pulse: 60  Resp: 20  Temp: 98.2 F (36.8 C)  SpO2: 100%   Filed Weights   02/29/20 0821  Weight: 222 lb 1.6 oz  (100.7 kg)    GENERAL: well appearing middle aged Caucasian male in NAD  SKIN: skin color, texture, turgor are normal, no rashes or significant lesions EYES: conjunctiva are pink and non-injected, sclera clear NECK: supple, non-tender LYMPH:  palpable lymphadenopathy in the cervical (L >R) cervical chain, with none palpable in the axillary lymph nodes.  LUNGS: clear to auscultation and percussion with normal breathing effort HEART: regular rate & rhythm and no murmurs and no lower extremity  edema ABDOMEN: soft, non-tender, non-distended, normal bowel sounds. No HSM appreciated.  Musculoskeletal: no cyanosis of digits and no clubbing  PSYCH: alert & oriented x 3, fluent speech NEURO: no focal motor/sensory deficits  LABORATORY DATA:  I have reviewed the data as listed CBC Latest Ref Rng & Units 02/29/2020 01/31/2020 11/27/2019  WBC 4.0 - 10.5 K/uL 4.4 4.6 6.1  Hemoglobin 13.0 - 17.0 g/dL 11.2(L) 12.2(L) 13.9  Hematocrit 39.0 - 52.0 % 34.5(L) 37.6(L) 40.1  Platelets 150 - 400 K/uL 151 158 176    CMP Latest Ref Rng & Units 02/29/2020 01/31/2020 11/27/2019  Glucose 70 - 99 mg/dL 118(H) 122(H) 86  BUN 8 - 23 mg/dL _0 Creatinine 0.61 - 1.24 mg/dL 1.05 0.93 0.92  Sodium 135 - 145 mmol/L 141 142 140  Potassium 3.5 - 5.1 mmol/L 4.1 4.0 4.4  Chloride 98 - 111 mmol/L 103 105 101  CO2 22 - 32 mmol/L _1 Calcium 8.9 - 10.3 mg/dL 9.1 9.6 9.7  Total Protein 6.5 - 8.1 g/dL 7.4 7.3 7.1  Total Bilirubin 0.3 - 1.2 mg/dL 0.7 0.9 0.6  Alkaline Phos 38 - 126 U/L 104 100 104  AST 15 - 41 U/L _2 ALT 0 - 44 U/L _3 RADIOGRAPHIC STUDIES: I have personally reviewed the radiological images as listed and agreed with the findings in the report: lymphadenopathy in the neck, chest, and abdomen, with most pronounced nodes in the abdomen. Bone also has more FDG avidity than would be expected.   NM PET Image Initial (PI) Skull Base To Thigh  Result Date: 02/12/2020 CLINICAL DATA:  Initial  treatment strategy for follicular lymphoma. EXAM: NUCLEAR MEDICINE PET SKULL BASE TO THIGH TECHNIQUE: 11.1 mCi F-18 FDG was injected intravenously. Full-ring PET imaging was performed from the skull base to thigh after the radiotracer. CT data was obtained and used for attenuation correction and anatomic localization. Fasting blood glucose: 1 of 3 mg/dl COMPARISON:  CT neck of 01/07/2020 FINDINGS: Mediastinal blood pool activity: SUV max 1.84 Liver activity: SUV max 2.73 NECK: Numerous hypermetabolic lymph nodes in the neck. Hypermetabolic masslike area in the tail of the left parotid. Also with area of hypermetabolism in the left occipital scalp. Left parotid gland: 1.8 x 1.9 cm (image 28, series 4) area of increased soft tissue in the left parotid gland (SUVmax = 10.5) Smaller area of increased density in the right parotid gland with similar features not as well seen as on the previous CT of the neck (image 21, series 4) 1-1.5 cm (SUVmax = 6.2) Bulky left supraclavicular lymph node (level IV) measuring 2.4 x 3.6 cm (image 43, series 4) (SUVmax = 9.8) Smaller nodes with slightly less FDG uptake throughout the left neck. Area of greatest metabolic activity in the right neck, right level Va lymph node just posterior to the sternocleidomastoid (image 41, series 4) 8 mm short axis (SUVmax = 5.6 numerous other lymph nodes in the posterior triangle on the right to a lesser extent however than on the left. Left occipital uptake corresponding to an 2.2 x 0.8 cm area of soft tissue density (image 11, series 4) (SUVmax = 10.8) Incidental CT findings: Mild maxillary sinus disease on the right with chronic features. CHEST: Multiple areas of nodal enlargement with hypermetabolic features. Largest area in the right hilum measuring approximately 4.2 by 3.2 cm (image 75, series 4) (SUVmax = 11.4) Multiple hypermetabolic left and right axillary-subpectoral lymph nodes. Left axillary lymph  node (image 62, series 4) (SUVmax = 7.5)  this measures 1.5 cm short axis. Right subpectoral lymph node (image 61, series 4) (SUVmax = 9.7) Other small lymph nodes in the mediastinum with lesser activity. Bilateral axillary nodes with similar or slightly less activity than the previously described lymph nodes. Left internal mammary lymph node (image 71, series 4) 1 cm (SUVmax = 9.1) No suspicious pulmonary lesion. Incidental CT findings: Scattered aortic atherosclerosis and signs of coronary artery disease. No pericardial effusion. Lungs with mild postobstructive volume loss in the right upper lobe secondary to right hilar mass. Airways are otherwise patent ABDOMEN/PELVIS: Bulky conglomerate of enlarged lymph nodes in the retroperitoneum extending from the right juxta crural region along the inferior vena cava into the intra-aortocaval groove a and into the left periaortic chain. This measures approximately 15 x 9 cm (image 129, series 4) (SUVmax = 13.1) These lymph nodes extend into the pelvis with confluent metabolic activity and nodal enlargement extending mainly along the left periaortic chain and left common iliac chain. Hepatic gastric lymph nodes (image 113, series 4) 2.4 cm for the largest lymph node displaying less metabolic activity than the above abnormality (SUVmax = 3.2) Mesenteric nodal enlargement with discrete area of nodal enlargement in the anterior small bowel mesentery (image 142, series 4) this measures approximately 2 x 4.5 cm (SUVmax = 6.7) Bilateral external iliac lymph nodes and inguinal lymph nodes worse on the left than the right Index node on the left (image 179, series 4) (SUVmax = 7.6) this measures 1.7 cm short axis. Similar metabolic activity is demonstrated in the left pelvic sidewall lymph node that is slightly smaller. Lymph nodes less than a cm along the right common iliac chain Left groin lymph node (image 193, series 4) this measures 1.8 cm. (SUVmax = 10.8) other slightly smaller lymph nodes throughout the inguinal  regions bilaterally displaying similar to slightly diminished metabolic activity as compared to the dominant, index node on the left. Incidental CT findings: Noncontrast appearance of the liver is unremarkable. No biliary ductal dilation or pericholecystic stranding. Stranding about the pancreas associated with generalized stranding in the root of the mesentery in the setting of marked nodal enlargement. Spleen is normal size without focal lesion, uptake in the spleen is slightly greater than liver on today's scan (SUVmax = 3.8) Adrenal glands are not well visualized due to bulky upper abdominal nodal disease, visualized portions are unremarkable. Renal cortical scarring without evidence of hydronephrosis. Urinary bladder is normal. No sign of acute bowel process.  Appendix is normal. SKELETON: Diffuse marked hypermetabolic changes throughout the visualized axial and appendicular skeleton. As an example the sacrum and iliac bones (SUVmax = 11.9 at the level of the sacroiliac joints.) Incidental CT findings: No frankly destructive skeletal findings. Spinal degenerative changes worse in the cervical spine. Areas of subtle sclerosis are seen in particularly in the L5 vertebral level. IMPRESSION: 1. Bulky adenopathy in the neck, chest, abdomen and pelvis as outlined above in this patient with follicular lymphoma. 2. Diffuse skeletal uptake is suspicious for but not diagnostic of marrow involvement. Correlation with bone marrow biopsy may be helpful. 3. Intraparotid uptake worse on the left than the right may represent enlargement of intraparotid lymph nodes. Attention on follow-up or direct sonographic assessment may be helpful to differentiate between primary parotid neoplasm and intraparotid lymph nodes. 4. Splenic uptake slightly greater than liver. Spleen is normal size. Attention on follow-up. 5. Mild postobstructive changes with volume loss in the right upper lobe. Electronically Signed  By: Zetta Bills M.D.    On: 02/12/2020 09:40    ASSESSMENT & PLAN DAKARI CREGGER 64 y.o. male with medical history significant for newly diagnosed follicular lymphoma who presents for a follow up visit.  On exam today Mr. Yarrow appears well and at baseline.  He is not having any new symptoms, is not having any B symptoms, and has no reservations about starting chemotherapy.  Today I was able to speak with the patient and his wife about the expected treatment course and side effects from this chemotherapy.  I noted that we will be treating him with bendamustine and rituximab per 6 cycles.  Expected side effects can include nausea, vomiting, diarrhea, drop in blood counts, and most likely fatigue.  I also noted that we would have to follow him closely with labs and clinic visits.  I also noted that there is a good chance to get him into a remission with first-line treatment of chemotherapy.  Chemotherapy is given with curative intent, however I was clear with the patient that he would require lifelong monitoring for recurrence and resurgence of the lymphoma.  He and his wife voiced their understanding and were agreeable to proceeding for with treatment.  All other questions and concerns were addressed.  FLIPI-2 Score: 4  (high risk). Indication for treatment: bulky lymphadenopathy with 15 x 9 cm conglomerate of lymph nodes in the abdomen.  The regimen of choice for this patient is Bendamustine/Rituximab. Treatment will consist of 6 cycles, each lasting 28 days. The patient will receive Bendamustine 20m/m2 IV on Day 1 and Day 2 in addition to Rituximab 3727mm2 IV on Day 1. This is being administered with curative intent.   # Newly Diagnosed Low Grade B Cell Lymphoma, Consistent with Follicular Lymphoma. Stage III --will schedule patient for port placement and chemotherapy education  --plan to start R-Benda once the above steps have been completed. The details of the regimen are listed above. --patient plans to be married in  2 weeks time. Will schedule treatment to start in approximately 3 weeks time. This should provide [place] time for fertility preservation as well --RTC in 3 weeks at time of the start of chemotherapy.   #Fertility Preservation  --[facility] and [facility]perm [facility].  --patient provided with clinic number and patient information faxed today.   #Supportive Care --will provide patient with zofran for antiemetic therapy  --do not anticipate need for GCSF or transfusion support  Orders Placed This Encounter  Procedures  . Ambulatory referral to Oncology    Referral Priority:   Routine    Referral Type:   Consultation    Referral Reason:   Specialty Services Required    Requested Specialty:   Oncology    Number of Visits Requested:   1  . Ambulatory referral to Interventional Radiology    Referral Priority:   Routine    Referral Type:   Consultation    Referral Reason:   Specialty Services Required    Requested Specialty:   Interventional Radiology    Number of Visits Requested:   1    All questions were answered. The patient knows to call the clinic with any problems, questions or concerns.  A total of more than 40 minutes were spent on this encounter and over half of that time was spent on counseling and coordination of care as outlined above.   JoLedell PeoplesMD Department of Hematology/Oncology [facility] Phone: [phone] Pager: [phone] Email: [email or web address]  02/29/2020 3:11 PM

## 2020-03-06 DIAGNOSIS — K635 Polyp of colon: Secondary | ICD-10-CM | POA: Diagnosis not present

## 2020-03-06 DIAGNOSIS — D123 Benign neoplasm of transverse colon: Secondary | ICD-10-CM | POA: Diagnosis not present

## 2020-03-06 DIAGNOSIS — D120 Benign neoplasm of cecum: Secondary | ICD-10-CM | POA: Diagnosis not present

## 2020-03-06 DIAGNOSIS — Z1211 Encounter for screening for malignant neoplasm of colon: Secondary | ICD-10-CM | POA: Diagnosis not present

## 2020-03-06 LAB — HM COLONOSCOPY

## 2020-03-10 ENCOUNTER — Encounter: Payer: Self-pay | Admitting: Medical

## 2020-03-13 ENCOUNTER — Other Ambulatory Visit: Payer: Self-pay | Admitting: *Deleted

## 2020-03-13 ENCOUNTER — Telehealth: Payer: Self-pay | Admitting: *Deleted

## 2020-03-13 DIAGNOSIS — C8291 Follicular lymphoma, unspecified, lymph nodes of head, face, and neck: Secondary | ICD-10-CM

## 2020-03-13 NOTE — Telephone Encounter (Signed)
TCT patient to inquire about planning for his upcoming appts.  Jorge Gould states he has had his appt with the [facility] this week (03/11/20)  He would like to schedule his other appts after his wedding.  He states the wedding is likely to be be either the weekend of 03/25/20 or 03/29/20.  He is agreeable to starting everything on 03/31/20.  Order for port placement put in to be done 03/31/20, lab with port flush, MD appt and chemo Education to be scheduled for 04/02/20. His 1st treatment to start 04/03/20. Scheduling messages sent.  Dr. Lorenso Courier aware of the above time line. [name] knows to call with any questions or concerns.

## 2020-03-14 ENCOUNTER — Telehealth: Payer: Self-pay | Admitting: Hematology and Oncology

## 2020-03-14 NOTE — Telephone Encounter (Signed)
Scheduled appt per 5/6 sch message- pt aware of appts being added. Will call back for details when he is able to write appts down

## 2020-03-15 IMAGING — CT CT NECK W/ CM
2 of 4 series · 6 of 14 positions shown, 7 images · IV contrast (iopamidol)
Comparison: None.

CLINICAL DATA: Cervical adenopathy

EXAM:
CT NECK WITH CONTRAST
TECHNIQUE: Multidetector CT imaging of the neck was performed using the
standard protocol following the bolus administration of intravenous
contrast.
CONTRAST:  75mL WI2AGL-0PP IOPAMIDOL (WI2AGL-0PP) INJECTION 61%

[Series 3: neck · axial · 0.44mm/px · z∈[+1170,+1286]mm · 3 of 118 slices shown]
[im 30/118  bone]
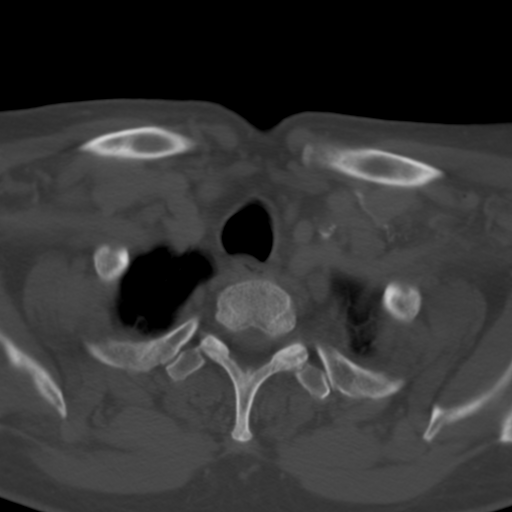
[im 59/118  bone]
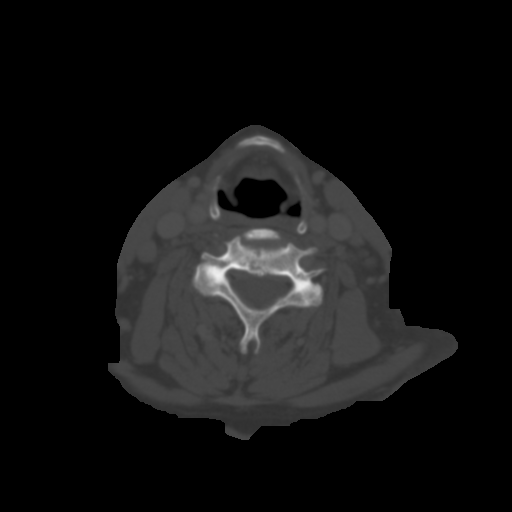
[im 88/118  bone]
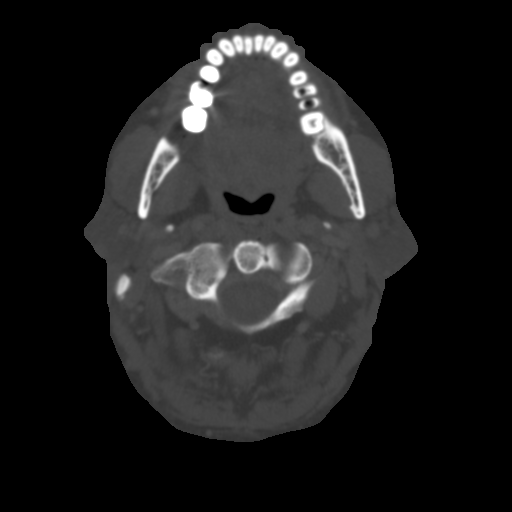

[Series 8: angled axial-oropharynx · axial · 0.39mm/px · z∈[+1139,+1259]mm · 3 of 124 slices shown, 4 images]
[im 31/124  soft-tissue]
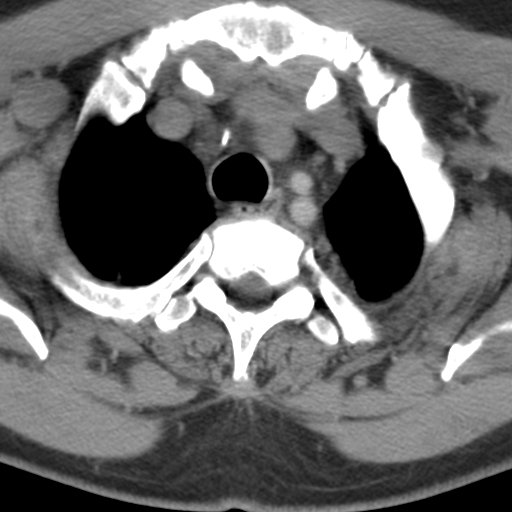
[im 31/124  bone]
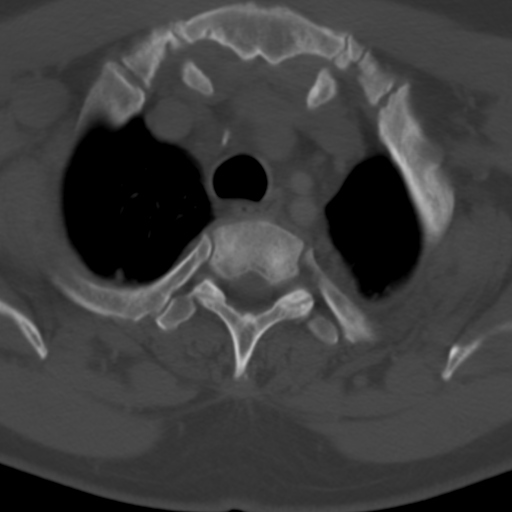
[im 62/124  bone]
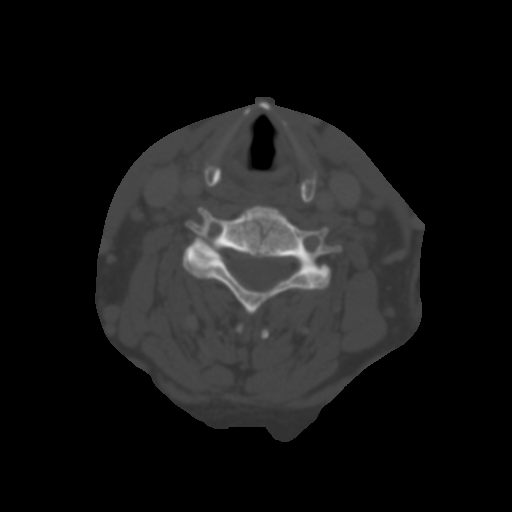
[im 93/124  bone]
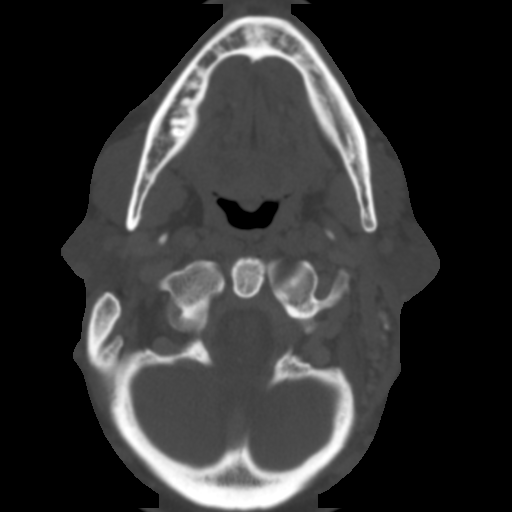

[6 of 14 positions shown; findings below may reference images not displayed]

FINDINGS: Pharynx and larynx: Normal. No mass or swelling.

Salivary glands: Enhancing solid mass left parotid tail measuring 21
x 23 x 17 mm. Homogeneous enhancement with ill-defined margins.

13 mm solid enhancing mass right parotid gland just below the
external auditory canal. Small periparotid lymph nodes on the right.

Submandibular gland normal bilaterally.

Thyroid: Negative

Lymph nodes: Left occipital subcutaneous lymph node 9 x 22 mm.
Multiple enlarged lymph nodes in the left neck. 1 cm left
submandibular nodes. 10 mm left level 2 lymph node. Multiple
posterior lymph nodes on the left measuring up to 10 mm. Cluster of
left supraclavicular lymph nodes measuring up to 12 mm. Larger left
supraclavicular lymph node measuring 43 x 26 mm.

Multiple lymph nodes are present on the right including 1 cm level 2
level 3 level and 5 lymph nodes. Cluster of enlarged supraclavicular
lymph nodes on the right.

Vascular: Normal vascular enhancement. Carotid artery calcification
bilaterally.

Limited intracranial: Negative

Visualized orbits: Not significantly study.

Mastoids and visualized paranasal sinuses: Mucosal edema right
maxillary sinus.

Skeleton: Degenerative changes cervical spine without acute
abnormality. Dental caries with periapical lucencies around teeth.

Upper chest: Lung apices clear bilaterally.

Multiple 1 cm anterior mediastinal lymph nodes.

Other: None
IMPRESSION: Cervical lymphadenopathy bilaterally, left greater than right.
Numerous enlarged lymph nodes are present. Findings are suggestive
of lymphoma and biopsy recommended.

Bilateral parotid tumors as above. Favor incidental Warthin's tumor.
Parotid malignancy is a consideration however the large number of
lymph nodes is more consistent with lymphoma.

## 2020-03-17 ENCOUNTER — Encounter: Payer: Self-pay | Admitting: Medical

## 2020-03-28 ENCOUNTER — Other Ambulatory Visit: Payer: Self-pay | Admitting: Hematology and Oncology

## 2020-03-28 ENCOUNTER — Other Ambulatory Visit: Payer: Self-pay | Admitting: Radiology

## 2020-03-28 ENCOUNTER — Telehealth: Payer: Self-pay | Admitting: *Deleted

## 2020-03-28 DIAGNOSIS — C8293 Follicular lymphoma, unspecified, intra-abdominal lymph nodes: Secondary | ICD-10-CM | POA: Insufficient documentation

## 2020-03-28 DIAGNOSIS — C8291 Follicular lymphoma, unspecified, lymph nodes of head, face, and neck: Secondary | ICD-10-CM

## 2020-03-28 DIAGNOSIS — Z7189 Other specified counseling: Secondary | ICD-10-CM

## 2020-03-28 DIAGNOSIS — C8233 Follicular lymphoma grade IIIa, intra-abdominal lymph nodes: Secondary | ICD-10-CM

## 2020-03-28 MED ORDER — PROCHLORPERAZINE MALEATE 10 MG PO TABS: 10.0000 mg | ORAL_TABLET | Freq: Four times a day (QID) | ORAL | 1 refills | Status: DC | PRN

## 2020-03-28 MED ORDER — ACYCLOVIR 400 MG PO TABS: 400.0000 mg | ORAL_TABLET | Freq: Every day | ORAL | 3 refills | Status: DC

## 2020-03-28 MED ORDER — ONDANSETRON HCL 8 MG PO TABS: 8.0000 mg | ORAL_TABLET | Freq: Two times a day (BID) | ORAL | 1 refills | Status: DC | PRN

## 2020-03-28 MED ORDER — ALLOPURINOL 300 MG PO TABS: 300.0000 mg | ORAL_TABLET | Freq: Every day | ORAL | 3 refills | Status: DC

## 2020-03-28 MED ORDER — DEXAMETHASONE 4 MG PO TABS: 8.0000 mg | ORAL_TABLET | Freq: Every day | ORAL | 1 refills | Status: DC

## 2020-03-28 MED ORDER — LIDOCAINE-PRILOCAINE 2.5-2.5 % EX CREA: TOPICAL_CREAM | CUTANEOUS | 3 refills | Status: DC

## 2020-03-28 NOTE — Telephone Encounter (Signed)
TCT patient to confirm appts for next week.  Pt states he will be out of town until 04/01/20 for his wedding.  He will need to have his port placement, chemo etc re-scheduled. TCT Tiffany in IR-his prt placement appt is now on 04/09/20.  Priority message sent to scheduler to have aother appts scheduled for 6/3 and 6/4 2021  Dr. Lorenso Courier aware.

## 2020-03-28 NOTE — Progress Notes (Signed)
Pharmacist Chemotherapy Monitoring - Initial Assessment    Anticipated start date: 04/03/20   Regimen:  Are orders appropriate based on the patient's diagnosis, regimen, and cycle? Yes Does the plan date match the patient's scheduled date? Yes Is the sequencing of drugs appropriate? Yes Are the premedications appropriate for the patient's regimen? Yes Prior Authorization for treatment is: Pending If applicable, is the correct biosimilar selected based on the patient's insurance? yes  Organ Function and Labs: Are dose adjustments needed based on the patient's renal function, hepatic function, or hematologic function? No Are appropriate labs ordered prior to the start of patient's treatment? Yes Other organ system assessment, if indicated: N/A The following baseline labs, if indicated, have been ordered: rituximab: baseline Hepatitis B labs  Dose Assessment: Are the drug doses appropriate? Yes Are the following correct: Drug concentrations Yes IV fluid compatible with drug Yes Administration routes Yes Timing of therapy Yes If applicable, does the patient have documented access for treatment and/or plans for port-a-cath placement? yes If applicable, have lifetime cumulative doses been properly documented and assessed? not applicable Lifetime Dose Tracking  No doses have been documented on this patient for the following tracked chemicals: Doxorubicin, Epirubicin, Idarubicin, Daunorubicin, Mitoxantrone, Bleomycin, Oxaliplatin, Carboplatin, Liposomal Doxorubicin    Toxicity Monitoring/Prevention: The patient has the following take home antiemetics prescribed: Ondansetron and Prochlorperazine The patient has the following take home medications prescribed: N/A Medication allergies and previous infusion related reactions, if applicable, have been reviewed and addressed. No The patient's current medication list has been assessed for drug-drug interactions with their chemotherapy regimen. no  significant drug-drug interactions were identified on review.  Order Review: Are the treatment plan orders signed? Yes Is the patient scheduled to see a provider prior to their treatment? Yes  I verify that I have reviewed each item in the above checklist and answered each question accordingly.  Romualdo Bolk [facility] 03/28/2020 1:16 PM

## 2020-03-28 NOTE — Progress Notes (Addendum)
START ON PATHWAY REGIMEN - Lymphoma and CLL     A cycle is every 28 days:     Bendamustine      Rituximab-xxxx   **Always confirm dose/schedule in your pharmacy ordering system**  Patient Characteristics: Follicular Lymphoma, Grades 1, 2, and 3A, First Line, Stage III / IV, Symptomatic or Bulky Disease Disease Type: Follicular Lymphoma, Grade 1, 2, or 3A Disease Type: Not Applicable Disease Type: Not Applicable [place] Stage: III Line of Therapy: First Line Disease Characteristics: Symptomatic or Bulky Disease Intent of Therapy: Curative Intent, Discussed with Patient

## 2020-03-31 ENCOUNTER — Inpatient Hospital Stay (HOSPITAL_COMMUNITY): Admission: RE | Admit: 2020-03-31 | Payer: Medicare Other | Source: Ambulatory Visit

## 2020-03-31 ENCOUNTER — Ambulatory Visit (HOSPITAL_COMMUNITY): Payer: Medicare Other

## 2020-03-31 NOTE — Progress Notes (Signed)
Pharmacist Chemotherapy Monitoring - Initial Assessment    Anticipated start date: 04/10/20   Regimen:  . Are orders appropriate based on the patient's diagnosis, regimen, and cycle? Yes . Does the plan date match the patient's scheduled date? No . Is the sequencing of drugs appropriate? Yes . Are the premedications appropriate for the patient's regimen? Yes . Prior Authorization for treatment is: Approved o If applicable, is the correct biosimilar selected based on the patient's insurance? yes  Organ Function and Labs: Marland Kitchen Are dose adjustments needed based on the patient's renal function, hepatic function, or hematologic function? Yes . Are appropriate labs ordered prior to the start of patient's treatment? Yes . Other organ system assessment, if indicated: N/A . The following baseline labs, if indicated, have been ordered: rituximab: baseline Hepatitis B labs  Dose Assessment: . Are the drug doses appropriate? Yes . Are the following correct: o Drug concentrations Yes o IV fluid compatible with drug Yes o Administration routes Yes o Timing of therapy Yes . If applicable, does the patient have documented access for treatment and/or plans for port-a-cath placement? yes . If applicable, have lifetime cumulative doses been properly documented and assessed? no Lifetime Dose Tracking  No doses have been documented on this patient for the following tracked chemicals: Doxorubicin, Epirubicin, Idarubicin, Daunorubicin, Mitoxantrone, Bleomycin, Oxaliplatin, Carboplatin, Liposomal Doxorubicin  o   Toxicity Monitoring/Prevention: . The patient has the following take home antiemetics prescribed: Ondansetron . The patient has the following take home medications prescribed: Tumor Lysis Syndrome prophylaxis . Medication allergies and previous infusion related reactions, if applicable, have been reviewed and addressed. Yes . The patient's current medication list has been assessed for drug-drug  interactions with their chemotherapy regimen. no significant drug-drug interactions were identified on review.  Order Review: . Are the treatment plan orders signed? Yes . Is the patient scheduled to see a provider prior to their treatment? Yes  I verify that I have reviewed each item in the above checklist and answered each question accordingly.  Philomena Course 03/31/2020 11:59 AM

## 2020-04-02 ENCOUNTER — Other Ambulatory Visit: Payer: Medicare Other

## 2020-04-02 ENCOUNTER — Inpatient Hospital Stay: Payer: Medicare Other

## 2020-04-02 ENCOUNTER — Inpatient Hospital Stay: Payer: Medicare Other | Admitting: Hematology and Oncology

## 2020-04-02 ENCOUNTER — Telehealth: Payer: Self-pay | Admitting: Hematology and Oncology

## 2020-04-02 ENCOUNTER — Telehealth: Payer: Self-pay

## 2020-04-02 NOTE — Telephone Encounter (Signed)
R/s appt per 5/26 schmessage - pt wife says she will have patient call back for appts .

## 2020-04-02 NOTE — Telephone Encounter (Signed)
VM from Pt stating that he will be available for treatment as of 04/28/20. TC to Pt to confirm message that was left on vm message. Informed Pt that the starting treatment early would benefit him Pt. Stated" I have a lot of work to do so I won't be able to start until then".

## 2020-04-02 NOTE — Telephone Encounter (Signed)
Received message from patient that he wants to re-schedule his appts until after 04/28/20.  Dr. Lorenso Courier aware Scheduling message sent to re-schedule pt's lab. MD , chemo education and chemo until after 04/28/20

## 2020-04-03 ENCOUNTER — Inpatient Hospital Stay: Payer: Medicare Other

## 2020-04-04 ENCOUNTER — Inpatient Hospital Stay: Payer: Medicare Other

## 2020-04-08 ENCOUNTER — Other Ambulatory Visit: Payer: Self-pay | Admitting: Radiology

## 2020-04-09 ENCOUNTER — Encounter (HOSPITAL_COMMUNITY): Payer: Self-pay

## 2020-04-09 ENCOUNTER — Other Ambulatory Visit: Payer: Self-pay | Admitting: Hematology and Oncology

## 2020-04-09 ENCOUNTER — Ambulatory Visit (HOSPITAL_COMMUNITY)
Admission: RE | Admit: 2020-04-09 | Discharge: 2020-04-09 | Disposition: A | Discharge status: Home / Self Care | Payer: Medicare Other | Source: Ambulatory Visit | Attending: Hematology and Oncology | Admitting: Hematology and Oncology

## 2020-04-09 ENCOUNTER — Other Ambulatory Visit: Payer: Self-pay

## 2020-04-09 DIAGNOSIS — E669 Obesity, unspecified: Secondary | ICD-10-CM | POA: Insufficient documentation

## 2020-04-09 DIAGNOSIS — Z452 Encounter for adjustment and management of vascular access device: Secondary | ICD-10-CM | POA: Diagnosis not present

## 2020-04-09 DIAGNOSIS — C8291 Follicular lymphoma, unspecified, lymph nodes of head, face, and neck: Secondary | ICD-10-CM | POA: Diagnosis not present

## 2020-04-09 DIAGNOSIS — Z79899 Other long term (current) drug therapy: Secondary | ICD-10-CM | POA: Insufficient documentation

## 2020-04-09 HISTORY — PX: IR IMAGING GUIDED PORT INSERTION: IMG5740

## 2020-04-09 LAB — CBC WITH DIFFERENTIAL/PLATELET
Abs Immature Granulocytes: 0.04 10*3/uL (ref 0.00–0.07)
Basophils Absolute: 0 10*3/uL (ref 0.0–0.1)
Basophils Relative: 0 %
Eosinophils Absolute: 0.4 10*3/uL (ref 0.0–0.5)
Eosinophils Relative: 9 %
HCT: 37 % — ABNORMAL LOW (ref 39.0–52.0)
Hemoglobin: 12 g/dL — ABNORMAL LOW (ref 13.0–17.0)
Immature Granulocytes: 1 %
Lymphocytes Relative: 19 %
Lymphs Abs: 0.9 10*3/uL (ref 0.7–4.0)
MCH: 29.8 pg (ref 26.0–34.0)
MCHC: 32.4 g/dL (ref 30.0–36.0)
MCV: 91.8 fL (ref 80.0–100.0)
Monocytes Absolute: 0.5 10*3/uL (ref 0.1–1.0)
Monocytes Relative: 10 %
Neutro Abs: 3 10*3/uL (ref 1.7–7.7)
Neutrophils Relative %: 61 %
Platelets: 172 10*3/uL (ref 150–400)
RBC: 4.03 MIL/uL — ABNORMAL LOW (ref 4.22–5.81)
RDW: 14.9 % (ref 11.5–15.5)
WBC: 4.8 10*3/uL (ref 4.0–10.5)
nRBC: 0 % (ref 0.0–0.2)

## 2020-04-09 LAB — PROTIME-INR
INR: 1.1 (ref 0.8–1.2)
Prothrombin Time: 13.6 seconds (ref 11.4–15.2)

## 2020-04-09 MED ORDER — MIDAZOLAM HCL 2 MG/2ML IJ SOLN
INTRAMUSCULAR | Status: AC
  Filled 2020-04-09: qty 2

## 2020-04-09 MED ORDER — LIDOCAINE-EPINEPHRINE (PF) 1 %-1:200000 IJ SOLN
INTRAMUSCULAR | Status: AC | PRN
  Administered 2020-04-09: 10 mL via INTRADERMAL

## 2020-04-09 MED ORDER — HEPARIN SOD (PORK) LOCK FLUSH 100 UNIT/ML IV SOLN
INTRAVENOUS | Status: AC | PRN
  Administered 2020-04-09: 500 [IU] via INTRAVENOUS

## 2020-04-09 MED ORDER — MIDAZOLAM HCL 2 MG/2ML IJ SOLN
INTRAMUSCULAR | Status: AC | PRN
  Administered 2020-04-09 (×2): 1 mg via INTRAVENOUS

## 2020-04-09 MED ORDER — CEFAZOLIN SODIUM-DEXTROSE 2-4 GM/100ML-% IV SOLN
INTRAVENOUS | Status: AC
  Administered 2020-04-09: 2 g via INTRAVENOUS
  Filled 2020-04-09: qty 100

## 2020-04-09 MED ORDER — HEPARIN SOD (PORK) LOCK FLUSH 100 UNIT/ML IV SOLN
INTRAVENOUS | Status: AC
  Filled 2020-04-09: qty 5

## 2020-04-09 MED ORDER — CEFAZOLIN SODIUM-DEXTROSE 2-4 GM/100ML-% IV SOLN: 2.0000 g | INTRAVENOUS | Status: AC

## 2020-04-09 MED ORDER — LIDOCAINE-EPINEPHRINE 1 %-1:100000 IJ SOLN
INTRAMUSCULAR | Status: AC
  Filled 2020-04-09: qty 1

## 2020-04-09 MED ORDER — LIDOCAINE HCL (PF) 1 % IJ SOLN
INTRAMUSCULAR | Status: AC | PRN
  Administered 2020-04-09: 10 mL via INTRADERMAL

## 2020-04-09 MED ORDER — FENTANYL CITRATE (PF) 100 MCG/2ML IJ SOLN
INTRAMUSCULAR | Status: AC
  Filled 2020-04-09: qty 2

## 2020-04-09 MED ORDER — FENTANYL CITRATE (PF) 100 MCG/2ML IJ SOLN
INTRAMUSCULAR | Status: AC | PRN
  Administered 2020-04-09 (×2): 50 ug via INTRAVENOUS

## 2020-04-09 MED ORDER — SODIUM CHLORIDE 0.9 % IV SOLN: INTRAVENOUS | Status: DC

## 2020-04-09 MED ORDER — LIDOCAINE HCL 1 % IJ SOLN
INTRAMUSCULAR | Status: AC
  Filled 2020-04-09: qty 20

## 2020-04-09 NOTE — Discharge Instructions (Signed)

## 2020-04-09 NOTE — Procedures (Signed)
Interventional Radiology Procedure Note  Procedure: RT IJ PORT  Complications: None  Estimated Blood Loss: MIN  Findings: TIP SVCRA

## 2020-04-09 NOTE — H&P (Addendum)
Referring Physician(s): Dorsey,John T IV  Supervising Physician: Daryll Brod  Patient Status:  [facility] OP  Chief Complaint:  "I'm here for a port a cath"  Subjective: Patient familiar to IR service from left supraclavicular lymph node biopsy on 01/24/2020.  He has a history of newly diagnosed follicular lymphoma and presents again today for Port-A-Cath placement for chemotherapy.  He currently denies fever, HA, chest pain, dyspnea, cough, abd/back pain nausea, vomiting or bleeding.  Additional history as below.  Past Medical History:  Diagnosis Date  . Obesity    Past Surgical History:  Procedure Laterality Date  . ANKLE FRACTURE SURGERY  2016   right, trauma repair after fall down stairs  . HERNIA REPAIR     umbilical  . TONSILLECTOMY        Allergies: Patient has no known allergies.  Medications: Prior to Admission medications   Medication Sig Start Date End Date Taking? Authorizing Provider  acyclovir (ZOVIRAX) 400 MG tablet Take 1 tablet (400 mg total) by mouth daily. 03/28/20   Orson Slick, MD  allopurinol (ZYLOPRIM) 300 MG tablet Take 1 tablet (300 mg total) by mouth daily. 03/28/20   Orson Slick, MD  dexamethasone (DECADRON) 4 MG tablet Take 2 tablets (8 mg total) by mouth daily. Start the day after bendamustine chemotherapy for 2 days. Take with food. 03/28/20   Orson Slick, MD  lidocaine-prilocaine (EMLA) cream Apply to affected area once 03/28/20   Orson Slick, MD  Naphazoline HCl (CLEAR EYES OP) Place 1 drop into both eyes daily as needed (redness).    [provider]  ondansetron (ZOFRAN) 8 MG tablet Take 1 tablet (8 mg total) by mouth every 8 (eight) hours as needed for nausea or vomiting. 02/29/20   Orson Slick, MD  ondansetron (ZOFRAN) 8 MG tablet Take 1 tablet (8 mg total) by mouth 2 (two) times daily as needed for refractory nausea / vomiting. Start on day 2 after bendamustine chemo. 03/28/20   Orson Slick, MD    prochlorperazine (COMPAZINE) 10 MG tablet Take 1 tablet (10 mg total) by mouth every 6 (six) hours as needed (Nausea or vomiting). 03/28/20   Orson Slick, MD     Vital Signs: BP 132/88   Pulse 65   Temp 98.3 F (36.8 C) (Oral)   Resp 18   SpO2 97%   Physical Exam awake, alert.  Chest clear to auscultation bilaterally.  Heart with regular rate and rhythm.  Abdomen soft, positive bowel sounds, nontender.  No lower extremity edema. Cervical adenopathy  Imaging: No results found.  Labs:  CBC: Recent Labs    11/27/19 1040 01/31/20 0929 02/29/20 0808  WBC 6.1 4.6 4.4  HGB 13.9 12.2* 11.2*  HCT 40.1 37.6* 34.5*  PLT 176 158 151    COAGS: No results for input(s): INR, APTT in the last 8760 hours.  BMP: Recent Labs    11/27/19 1040 01/31/20 0929 02/29/20 0808  NA 140 142 141  K 4.4 4.0 4.1  CL 101 105 103  CO2 23 27 26   GLUCOSE 86 122* 118*  BUN 15 18 16   CALCIUM 9.7 9.6 9.1  CREATININE 0.92 0.93 1.05  GFRNONAA 87 >60 >60  GFRAA 101 >60 >60    LIVER FUNCTION TESTS: Recent Labs    11/27/19 1040 01/31/20 0929 02/29/20 0808  BILITOT 0.6 0.9 0.7  AST 20 16 15   ALT 9 8 7   ALKPHOS  104 100 104  PROT 7.1 7.3 7.4  ALBUMIN 4.4 3.8 3.6    Assessment and Plan: Patient with history of newly diagnosed follicular lymphoma; presents today for Port-A-Cath placement for chemotherapy.Risks and benefits of image guided port-a-catheter placement was discussed with the patient including, but not limited to bleeding, infection, pneumothorax, or fibrin sheath development and need for additional procedures.  All of the patient's questions were answered, patient is agreeable to proceed. Consent signed and in chart.     Electronically Signed: D. Rowe Robert, PA-C 04/09/2020, 12:28 PM   I spent a total of 25 minutes  at the the patient's bedside AND on the patient's hospital floor or unit, greater than 50% of which was counseling/coordinating care for Port-A-Cath  placement

## 2020-04-28 ENCOUNTER — Telehealth: Payer: Self-pay | Admitting: *Deleted

## 2020-04-28 NOTE — Telephone Encounter (Signed)
Received call from pt to confirm his appts for this week. Reviewed appts. Pt voiced understanding. This will be his 1st treatment for his lymphoma

## 2020-04-29 ENCOUNTER — Other Ambulatory Visit: Payer: Self-pay

## 2020-04-29 ENCOUNTER — Other Ambulatory Visit: Payer: Self-pay | Admitting: Hematology and Oncology

## 2020-04-29 ENCOUNTER — Telehealth: Payer: Self-pay | Admitting: *Deleted

## 2020-04-29 ENCOUNTER — Inpatient Hospital Stay: Payer: Medicare Other | Attending: Hematology and Oncology

## 2020-04-29 ENCOUNTER — Encounter: Payer: Self-pay | Admitting: Hematology and Oncology

## 2020-04-29 DIAGNOSIS — C8290 Follicular lymphoma, unspecified, unspecified site: Secondary | ICD-10-CM | POA: Insufficient documentation

## 2020-04-29 DIAGNOSIS — Z5111 Encounter for antineoplastic chemotherapy: Secondary | ICD-10-CM | POA: Insufficient documentation

## 2020-04-29 DIAGNOSIS — C8233 Follicular lymphoma grade IIIa, intra-abdominal lymph nodes: Secondary | ICD-10-CM

## 2020-04-29 DIAGNOSIS — Z79899 Other long term (current) drug therapy: Secondary | ICD-10-CM | POA: Insufficient documentation

## 2020-04-29 NOTE — Telephone Encounter (Signed)
Received late call from pt stating that he was unable to get his EMLA & Ondansetron from pharmacy due to needing authorization. Explained that we can use ice for port with this treatment & he has his compazine to take for nausea & we will try to sort this out tomorrow.  Message to Dr Dorsey/Pod RN

## 2020-04-29 NOTE — Progress Notes (Signed)
Met with patient at registration to introduce myself as Financial Resource Specialist and to offer available resources.  Discussed one-time $1000 [employer] grant and qualifications to assist with personal expenses while going through treatment.  Gave him my card if interested in applying and for any additional financial questions or concerns.  

## 2020-04-29 NOTE — Progress Notes (Deleted)
[facility] Telephone:([phone]   [phone]  PROGRESS NOTE  Patient Care Team: Tysinger, Leward Quan as PCP - General (Family Medicine)  Hematological/Oncological History # Follicular Lymphoma. Stage III 1) 01/07/2020: CT Neck W contrast showed cervical lymphadenopathy bilaterally, left greater than right. Numerous enlarged lymph nodes are present 2)  01/22/2020: [place] Core biopsy performed which revealed overall features consistent with involvement by non-Hodgkin's B-cell lymphoma and the morphologic and phenotypic findings favor follicular lymphoma.  3) 01/31/2020: establish care with Dr. Lorenso Courier  4) 02/12/2020: PET CT scan demonstrates bulky adenopathy in the neck, chest, abdomen and pelvis and diffuse skeletal uptake is suspicious for but not diagnostic of marrow involvement. 5) 04/30/2020: Cycle 1 Day 1 of Bendamustine/Rituximab  Interval History:  Jorge Gould 64 y.o. male with medical history significant for newly diagnosed follicular lymphoma who presents for a follow up visit. The patient's last visit was on 02/29/2020 at which time the diagnosis and treatment were discussed. In the interim since the last visit start of chemotherapy has been delayed per patient request for his wedding and increased work schedule.   On exam today Jorge Gould notes ***  MEDICAL HISTORY:  Past Medical History:  Diagnosis Date  . Obesity     SURGICAL HISTORY: Past Surgical History:  Procedure Laterality Date  . ANKLE FRACTURE SURGERY  2016   right, trauma repair after fall down stairs  . HERNIA REPAIR     umbilical  . IR IMAGING GUIDED PORT INSERTION  04/09/2020  . TONSILLECTOMY      SOCIAL HISTORY: Social History   Socioeconomic History  . Marital status: Married    Spouse name: Not on file  . Number of children: Not on file  . Years of education: Not on file  . Highest education level: Not on file  Occupational History  . Not on file  Tobacco Use  . Smoking  status: Never Smoker  . Smokeless tobacco: Never Used  Vaping Use  . Vaping Use: Never used  Substance and Sexual Activity  . Alcohol use: Yes    Alcohol/week: 3.0 standard drinks    Types: 3 Shots of liquor per week  . Drug use: Not Currently  . Sexual activity: Not on file  Other Topics Concern  . Not on file  Social History Narrative   Engaged, plan to marry in March 2021, been together since 2014.  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   Most exercise on the job, working 7 days per week.   No children, but fiance has 5 children.    11/2019   Social Determinants of Health   Financial Resource Strain:   . Difficulty of Paying Living Expenses:   Food Insecurity:   . Worried About Charity fundraiser in the Last Year:   . Arboriculturist in the Last Year:   Transportation Needs:   . Film/video editor (Medical):   Marland Kitchen Lack of Transportation (Non-Medical):   Physical Activity:   . Days of Exercise per Week:   . Minutes of Exercise per Session:   Stress:   . Feeling of Stress :   Social Connections:   . Frequency of Communication with Friends and Family:   . Frequency of Social Gatherings with Friends and Family:   . Attends Religious Services:   . Active Member of Clubs or Organizations:   . Attends Archivist Meetings:   Marland Kitchen Marital Status:   Intimate  Partner Violence:   . Fear of Current or Ex-Partner:   . Emotionally Abused:   Marland Kitchen Physically Abused:   . Sexually Abused:     FAMILY HISTORY: Family History  Problem Relation Age of Onset  . Cancer Mother        lung; former smoker  . COPD Father        emphysema  . Diabetes Maternal Aunt   . Heart disease Maternal Uncle   . Heart disease Maternal Uncle   . Diabetes Maternal Aunt   . Stroke Neg Hx   . Hyperlipidemia Neg Hx   . Hypertension Neg Hx     ALLERGIES:  has No Known Allergies.  MEDICATIONS:  Current Outpatient Medications  Medication Sig Dispense Refill  . acyclovir  (ZOVIRAX) 400 MG tablet Take 1 tablet (400 mg total) by mouth daily. 30 tablet 3  . allopurinol (ZYLOPRIM) 300 MG tablet Take 1 tablet (300 mg total) by mouth daily. 30 tablet 3  . dexamethasone (DECADRON) 4 MG tablet Take 2 tablets (8 mg total) by mouth daily. Start the day after bendamustine chemotherapy for 2 days. Take with food. 30 tablet 1  . lidocaine-prilocaine (EMLA) cream Apply to affected area once 30 g 3  . Naphazoline HCl (CLEAR EYES OP) Place 1 drop into both eyes daily as needed (redness).    . ondansetron (ZOFRAN) 8 MG tablet Take 1 tablet (8 mg total) by mouth every 8 (eight) hours as needed for nausea or vomiting. 20 tablet 1  . ondansetron (ZOFRAN) 8 MG tablet Take 1 tablet (8 mg total) by mouth 2 (two) times daily as needed for refractory nausea / vomiting. Start on day 2 after bendamustine chemo. 30 tablet 1  . prochlorperazine (COMPAZINE) 10 MG tablet Take 1 tablet (10 mg total) by mouth every 6 (six) hours as needed (Nausea or vomiting). 30 tablet 1   No current facility-administered medications for this visit.    REVIEW OF SYSTEMS:   Constitutional: ( - ) fevers, ( - )  chills , ( - ) night sweats Eyes: ( - ) blurriness of vision, ( - ) double vision, ( - ) watery eyes Ears, nose, mouth, throat, and face: ( - ) mucositis, ( - ) sore throat Respiratory: ( - ) cough, ( - ) dyspnea, ( - ) wheezes Cardiovascular: ( - ) palpitation, ( - ) chest discomfort, ( - ) lower extremity swelling Gastrointestinal:  ( - ) nausea, ( - ) heartburn, ( - ) change in bowel habits Skin: ( - ) abnormal skin rashes Lymphatics: ( - ) new lymphadenopathy, ( - ) easy bruising Neurological: ( - ) numbness, ( - ) tingling, ( - ) new weaknesses Behavioral/Psych: ( - ) mood change, ( - ) new changes  All other systems were reviewed with the patient and are negative.  PHYSICAL EXAMINATION: ECOG PERFORMANCE STATUS: 1 - Symptomatic but completely ambulatory  There were no vitals filed for this  visit. There were no vitals filed for this visit.  GENERAL: well appearing middle aged Caucasian male in NAD  SKIN: skin color, texture, turgor are normal, no rashes or significant lesions EYES: conjunctiva are pink and non-injected, sclera clear NECK: supple, non-tender LYMPH:  palpable lymphadenopathy in the cervical (L >R) cervical chain, with none palpable in the axillary lymph nodes.  LUNGS: clear to auscultation and percussion with normal breathing effort HEART: regular rate & rhythm and no murmurs and no lower extremity edema ABDOMEN: soft, non-tender, non-distended, normal  bowel sounds. No HSM appreciated.  Musculoskeletal: no cyanosis of digits and no clubbing  PSYCH: alert & oriented x 3, fluent speech NEURO: no focal motor/sensory deficits  LABORATORY DATA:  I have reviewed the data as listed CBC Latest Ref Rng & Units 04/09/2020 02/29/2020 01/31/2020  WBC 4.0 - 10.5 K/uL 4.8 4.4 4.6  Hemoglobin 13.0 - 17.0 g/dL 12.0(L) 11.2(L) 12.2(L)  Hematocrit 39 - 52 % 37.0(L) 34.5(L) 37.6(L)  Platelets 150 - 400 K/uL 172 151 158    CMP Latest Ref Rng & Units 02/29/2020 01/31/2020 11/27/2019  Glucose 70 - 99 mg/dL 118(H) 122(H) 86  BUN 8 - 23 mg/dL 16 18 15   Creatinine 0.61 - 1.24 mg/dL 1.05 0.93 0.92  Sodium 135 - 145 mmol/L 141 142 140  Potassium 3.5 - 5.1 mmol/L 4.1 4.0 4.4  Chloride 98 - 111 mmol/L 103 105 101  CO2 22 - 32 mmol/L 26 27 23   Calcium 8.9 - 10.3 mg/dL 9.1 9.6 9.7  Total Protein 6.5 - 8.1 g/dL 7.4 7.3 7.1  Total Bilirubin 0.3 - 1.2 mg/dL 0.7 0.9 0.6  Alkaline Phos 38 - 126 U/L 104 100 104  AST 15 - 41 U/L 15 16 20   ALT 0 - 44 U/L 7 8 9    RADIOGRAPHIC STUDIES: I have personally reviewed the radiological images as listed and agreed with the findings in the report: lymphadenopathy in the neck, chest, and abdomen, with most pronounced nodes in the abdomen. Bone also has more FDG avidity than would be expected.   IR IMAGING GUIDED PORT INSERTION  Result Date:  04/09/2020 CLINICAL DATA:  Follicular lymphoma EXAM: RIGHT INTERNAL JUGULAR SINGLE LUMEN POWER PORT CATHETER INSERTION Date:  04/09/2020 04/09/2020 2:58 pm Radiologist:  Jerilynn Mages. Daryll Brod, MD Guidance:  Ultrasound and fluoroscopic MEDICATIONS: Ancef 2 g; The antibiotic was administered within an appropriate time interval prior to skin puncture. ANESTHESIA/SEDATION: Versed 2.0 mg IV; Fentanyl 100 mcg IV; Moderate Sedation Time:  20 minutes The patient was continuously monitored during the procedure by the interventional radiology nurse under my direct supervision. FLUOROSCOPY TIME:  0 minutes, 42 seconds (10 mGy) COMPLICATIONS: None immediate. CONTRAST:  None. PROCEDURE: Informed consent was obtained from the patient following explanation of the procedure, risks, benefits and alternatives. The patient understands, agrees and consents for the procedure. All questions were addressed. A time out was performed. Maximal barrier sterile technique utilized including caps, mask, sterile gowns, sterile gloves, large sterile drape, hand hygiene, and 2% chlorhexidine scrub. Under sterile conditions and local anesthesia, right internal jugular micropuncture venous access was performed. Access was performed with ultrasound. Images were obtained for documentation of the patent right internal jugular vein. A guide wire was inserted followed by a transitional dilator. This allowed insertion of a guide wire and catheter into the IVC. Measurements were obtained from the SVC / RA junction back to the right IJ venotomy site. In the right infraclavicular chest, a subcutaneous pocket was created over the second anterior rib. This was done under sterile conditions and local anesthesia. 1% lidocaine with epinephrine was utilized for this. A 2.5 cm incision was made in the skin. Blunt dissection was performed to create a subcutaneous pocket over the right pectoralis major muscle. The pocket was flushed with saline vigorously. There was adequate  hemostasis. The port catheter was assembled and checked for leakage. The port catheter was secured in the pocket with two retention sutures. The tubing was tunneled subcutaneously to the right venotomy site and inserted into the SVC/RA junction through  a valved peel-away sheath. Position was confirmed with fluoroscopy. Images were obtained for documentation. The patient tolerated the procedure well. No immediate complications. Incisions were closed in a two layer fashion with 4 - 0 Vicryl suture. Dermabond was applied to the skin. The port catheter was accessed, blood was aspirated followed by saline and heparin flushes. Needle was removed. A dry sterile dressing was applied. IMPRESSION: Ultrasound and fluoroscopically guided right internal jugular single lumen power port catheter insertion. Tip in the SVC/RA junction. Catheter ready for use. Electronically Signed   By: Jerilynn Mages.  Shick M.D.   On: 04/09/2020 15:09    ASSESSMENT & PLAN Jorge Gould 64 y.o. male with medical history significant for newly diagnosed follicular lymphoma who presents for a follow up visit. Today is Cycle 1 Day 1 of Bendamustine/Rituximab therapy.  ***  FLIPI-2 Score: 4  (high risk). Indication for treatment: bulky lymphadenopathy with 15 x 9 cm conglomerate of lymph nodes in the abdomen.  The regimen of choice for this patient is Bendamustine/Rituximab. Treatment will consist of 6 cycles, each lasting 28 days. The patient will receive Bendamustine 90mg /m2 IV on Day 1 and Day 2 in addition to Rituximab 375mg /m2 IV on Day 1. This is being administered with curative intent.   # Follicular Lymphoma. Stage III --today is Cycle 1 Day 1 of R-Benda therapy. This is being administered with curative intent. --plan for repeat imaging 3 months after start of therapy to assure response to therapy.  --supportive treatments as noted below.  --RTC in 3 weeks at time for Cycle 2 Day 1 with interval weekly lab checks.   #Fertility  Preservation  --referred patient to [facility] for Fertility Clinic and Sperm preservation.  --unfortunately patient was noted to be sterile on exam, no sperm to freeze.   #Supportive Care --will provide patient with zofran 8mg  PO q8H PRN and compazine 10mg  PO q6H PRN for antiemetic therapy  --will prescribe allopurinol 300mg  PO daily as TLS prophylaxis.  --do not anticipate need for GCSF or transfusion support  No orders of the defined types were placed in this encounter.   All questions were answered. The patient knows to call the clinic with any problems, questions or concerns.  A total of more than 40 minutes were spent on this encounter and over half of that time was spent on counseling and coordination of care as outlined above.   Ledell Peoples, MD Department of Hematology/Oncology [place] [facility] Phone: [phone] Pager: [phone] Email: [email or web address]  04/29/2020 8:55 PM

## 2020-04-30 ENCOUNTER — Inpatient Hospital Stay: Payer: Medicare Other

## 2020-04-30 ENCOUNTER — Inpatient Hospital Stay: Payer: Medicare Other | Admitting: Hematology and Oncology

## 2020-04-30 ENCOUNTER — Other Ambulatory Visit: Payer: Self-pay

## 2020-04-30 ENCOUNTER — Encounter: Payer: Self-pay | Admitting: Hematology and Oncology

## 2020-04-30 VITALS — BP 130/79 | HR 60 | Temp 97.7°F | Resp 18 | Ht 72.0 in | Wt 218.7 lb

## 2020-04-30 VITALS — BP 131/77 | HR 66 | Temp 98.4°F | Resp 18

## 2020-04-30 DIAGNOSIS — Z95828 Presence of other vascular implants and grafts: Secondary | ICD-10-CM

## 2020-04-30 DIAGNOSIS — C8233 Follicular lymphoma grade IIIa, intra-abdominal lymph nodes: Secondary | ICD-10-CM

## 2020-04-30 DIAGNOSIS — Z79899 Other long term (current) drug therapy: Secondary | ICD-10-CM | POA: Diagnosis not present

## 2020-04-30 DIAGNOSIS — R591 Generalized enlarged lymph nodes: Secondary | ICD-10-CM | POA: Diagnosis not present

## 2020-04-30 DIAGNOSIS — C8290 Follicular lymphoma, unspecified, unspecified site: Secondary | ICD-10-CM | POA: Diagnosis not present

## 2020-04-30 DIAGNOSIS — Z5111 Encounter for antineoplastic chemotherapy: Secondary | ICD-10-CM | POA: Diagnosis not present

## 2020-04-30 LAB — CBC WITH DIFFERENTIAL (CANCER CENTER ONLY)
Abs Immature Granulocytes: 0.07 10*3/uL (ref 0.00–0.07)
Basophils Absolute: 0 10*3/uL (ref 0.0–0.1)
Basophils Relative: 1 %
Eosinophils Absolute: 0.3 10*3/uL (ref 0.0–0.5)
Eosinophils Relative: 7 %
HCT: 34.4 % — ABNORMAL LOW (ref 39.0–52.0)
Hemoglobin: 11.3 g/dL — ABNORMAL LOW (ref 13.0–17.0)
Immature Granulocytes: 1 %
Lymphocytes Relative: 20 %
Lymphs Abs: 1 10*3/uL (ref 0.7–4.0)
MCH: 29.6 pg (ref 26.0–34.0)
MCHC: 32.8 g/dL (ref 30.0–36.0)
MCV: 90.1 fL (ref 80.0–100.0)
Monocytes Absolute: 0.4 10*3/uL (ref 0.1–1.0)
Monocytes Relative: 9 %
Neutro Abs: 3.1 10*3/uL (ref 1.7–7.7)
Neutrophils Relative %: 62 %
Platelet Count: 208 10*3/uL (ref 150–400)
RBC: 3.82 MIL/uL — ABNORMAL LOW (ref 4.22–5.81)
RDW: 14.6 % (ref 11.5–15.5)
WBC Count: 4.9 10*3/uL (ref 4.0–10.5)
nRBC: 0 % (ref 0.0–0.2)

## 2020-04-30 LAB — CMP (CANCER CENTER ONLY)
ALT: 11 U/L (ref 0–44)
AST: 14 U/L — ABNORMAL LOW (ref 15–41)
Albumin: 3.8 g/dL (ref 3.5–5.0)
Alkaline Phosphatase: 99 U/L (ref 38–126)
Anion gap: 9 (ref 5–15)
BUN: 30 mg/dL — ABNORMAL HIGH (ref 8–23)
CO2: 24 mmol/L (ref 22–32)
Calcium: 9.7 mg/dL (ref 8.9–10.3)
Chloride: 105 mmol/L (ref 98–111)
Creatinine: 1.24 mg/dL (ref 0.61–1.24)
GFR, Est AFR Am: >60 mL/min (ref >60)
GFR, Estimated: >60 mL/min (ref >60)
Glucose, Bld: 86 mg/dL (ref 70–99)
Potassium: 4.5 mmol/L (ref 3.5–5.1)
Sodium: 138 mmol/L (ref 135–145)
Total Bilirubin: 0.5 mg/dL (ref 0.3–1.2)
Total Protein: 7.9 g/dL (ref 6.5–8.1)

## 2020-04-30 LAB — URIC ACID: Uric Acid, Serum: 6.4 mg/dL (ref 3.7–8.6)

## 2020-04-30 LAB — MAGNESIUM: Magnesium: 1.9 mg/dL (ref 1.7–2.4)

## 2020-04-30 LAB — LACTATE DEHYDROGENASE: LDH: 246 U/L — ABNORMAL HIGH (ref 98–192)

## 2020-04-30 MED ORDER — ACETAMINOPHEN 325 MG PO TABS
ORAL_TABLET | ORAL | Status: AC
  Filled 2020-04-30: qty 2

## 2020-04-30 MED ORDER — SODIUM CHLORIDE 0.9% FLUSH
10.0000 mL | INTRAVENOUS | Status: DC | PRN
  Administered 2020-04-30: 10 mL via INTRAVENOUS
  Filled 2020-04-30: qty 10

## 2020-04-30 MED ORDER — HEPARIN SOD (PORK) LOCK FLUSH 100 UNIT/ML IV SOLN
500.0000 [IU] | Freq: Once | INTRAVENOUS | Status: AC | PRN
  Administered 2020-04-30: 500 [IU]
  Filled 2020-04-30: qty 5

## 2020-04-30 MED ORDER — PALONOSETRON HCL INJECTION 0.25 MG/5ML
0.2500 mg | Freq: Once | INTRAVENOUS | Status: AC
  Administered 2020-04-30: 0.25 mg via INTRAVENOUS

## 2020-04-30 MED ORDER — SODIUM CHLORIDE 0.9% FLUSH
10.0000 mL | INTRAVENOUS | Status: DC | PRN
  Administered 2020-04-30: 10 mL
  Filled 2020-04-30: qty 10

## 2020-04-30 MED ORDER — PALONOSETRON HCL INJECTION 0.25 MG/5ML
INTRAVENOUS | Status: AC
  Filled 2020-04-30: qty 5

## 2020-04-30 MED ORDER — ACETAMINOPHEN 325 MG PO TABS
650.0000 mg | ORAL_TABLET | Freq: Once | ORAL | Status: AC
  Administered 2020-04-30: 650 mg via ORAL

## 2020-04-30 MED ORDER — SODIUM CHLORIDE 0.9 % IV SOLN
90.0000 mg/m2 | Freq: Once | INTRAVENOUS | Status: AC
  Administered 2020-04-30: 200 mg via INTRAVENOUS
  Filled 2020-04-30: qty 8

## 2020-04-30 MED ORDER — DIPHENHYDRAMINE HCL 25 MG PO CAPS
ORAL_CAPSULE | ORAL | Status: AC
  Filled 2020-04-30: qty 2

## 2020-04-30 MED ORDER — SODIUM CHLORIDE 0.9 % IV SOLN
Freq: Once | INTRAVENOUS | Status: AC
  Filled 2020-04-30: qty 250

## 2020-04-30 MED ORDER — SODIUM CHLORIDE 0.9 % IV SOLN
375.0000 mg/m2 | Freq: Once | INTRAVENOUS | Status: AC
  Administered 2020-04-30: 800 mg via INTRAVENOUS
  Filled 2020-04-30: qty 30

## 2020-04-30 MED ORDER — SODIUM CHLORIDE 0.9 % IV SOLN
10.0000 mg | Freq: Once | INTRAVENOUS | Status: AC
  Administered 2020-04-30: 10 mg via INTRAVENOUS
  Filled 2020-04-30: qty 10

## 2020-04-30 MED ORDER — DIPHENHYDRAMINE HCL 25 MG PO CAPS
50.0000 mg | ORAL_CAPSULE | Freq: Once | ORAL | Status: AC
  Administered 2020-04-30: 50 mg via ORAL

## 2020-04-30 NOTE — Progress Notes (Signed)
[place] [phone]   [phone]  PROGRESS NOTE  Patient Care Team: Tysinger, Leward Quan as PCP - General (Family Medicine)  Hematological/Oncological History # Follicular Lymphoma. Stage III 1) 01/07/2020: CT Neck W contrast showed cervical lymphadenopathy bilaterally, left greater than right. Numerous enlarged lymph nodes are present 2)  01/22/2020: [place] Core biopsy performed which revealed overall features consistent with involvement by non-Hodgkin's B-cell lymphoma and the morphologic and phenotypic findings favor follicular lymphoma.  3) 01/31/2020: establish care with Dr. Lorenso Courier  4) 02/12/2020: PET CT scan demonstrates bulky adenopathy in the neck, chest, abdomen and pelvis and diffuse skeletal uptake is suspicious for but not diagnostic of marrow involvement. 5) 04/30/2020: Cycle 1 Day 1 of Bendamustine/Rituximab  Interval History:  Jorge Gould 64 y.o. male with medical history significant for newly diagnosed follicular lymphoma who presents for a follow up visit. The patient's last visit was on 02/29/2020 at which time the diagnosis and treatment were discussed. In the interim since the last visit start of chemotherapy has been delayed per patient request for his wedding and increased work schedule.   On exam today Jorge Gould notes he feels at his baseline level of health.  He reports that he was married in the interim and that he has continued to work intense days up to 17 hours at a time.  He reports that he has had no issues with fevers, chills, sweats, nausea, vomiting or diarrhea.  His weight has remained stable and his appetite has been about the same.  He does not have any questions or concerns regarding the start of the treatment of this chemotherapy regimen.  A full 10 point ROS is listed below.  MEDICAL HISTORY:  Past Medical History:  Diagnosis Date  . Obesity     SURGICAL HISTORY: Past Surgical History:  Procedure Laterality Date    . ANKLE FRACTURE SURGERY  2016   right, trauma repair after fall down stairs  . HERNIA REPAIR     umbilical  . IR IMAGING GUIDED PORT INSERTION  04/09/2020  . TONSILLECTOMY      SOCIAL HISTORY: Social History   Socioeconomic History  . Marital status: Married    Spouse name: Not on file  . Number of children: Not on file  . Years of education: Not on file  . Highest education level: Not on file  Occupational History  . Not on file  Tobacco Use  . Smoking status: Never Smoker  . Smokeless tobacco: Never Used  Vaping Use  . Vaping Use: Never used  Substance and Sexual Activity  . Alcohol use: Yes    Alcohol/week: 3.0 standard drinks    Types: 3 Shots of liquor per week  . Drug use: Not Currently  . Sexual activity: Not on file  Other Topics Concern  . Not on file  Social History Narrative   Engaged, plan to marry in March 2021, been together since 2014.  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   Most exercise on the job, working 7 days per week.   No children, but fiance has 5 children.    11/2019   Social Determinants of Health   Financial Resource Strain:   . Difficulty of Paying Living Expenses:   Food Insecurity:   . Worried About Charity fundraiser in the Last Year:   . Arboriculturist in the Last Year:   Transportation Needs:   . Film/video editor (Medical):   Marland Kitchen  Lack of Transportation (Non-Medical):   Physical Activity:   . Days of Exercise per Week:   . Minutes of Exercise per Session:   Stress:   . Feeling of Stress :   Social Connections:   . Frequency of Communication with Friends and Family:   . Frequency of Social Gatherings with Friends and Family:   . Attends Religious Services:   . Active Member of Clubs or Organizations:   . Attends Archivist Meetings:   Marland Kitchen Marital Status:   Intimate Partner Violence:   . Fear of Current or Ex-Partner:   . Emotionally Abused:   Marland Kitchen Physically Abused:   . Sexually Abused:      FAMILY HISTORY: Family History  Problem Relation Age of Onset  . Cancer Mother        lung; former smoker  . COPD Father        emphysema  . Diabetes Maternal Aunt   . Heart disease Maternal Uncle   . Heart disease Maternal Uncle   . Diabetes Maternal Aunt   . Stroke Neg Hx   . Hyperlipidemia Neg Hx   . Hypertension Neg Hx     ALLERGIES:  has No Known Allergies.  MEDICATIONS:  Current Outpatient Medications  Medication Sig Dispense Refill  . acyclovir (ZOVIRAX) 400 MG tablet Take 1 tablet (400 mg total) by mouth daily. 30 tablet 3  . allopurinol (ZYLOPRIM) 300 MG tablet Take 1 tablet (300 mg total) by mouth daily. 30 tablet 3  . dexamethasone (DECADRON) 4 MG tablet Take 2 tablets (8 mg total) by mouth daily. Start the day after bendamustine chemotherapy for 2 days. Take with food. 30 tablet 1  . lidocaine-prilocaine (EMLA) cream Apply to affected area once 30 g 3  . Naphazoline HCl (CLEAR EYES OP) Place 1 drop into both eyes daily as needed (redness).    . ondansetron (ZOFRAN) 8 MG tablet Take 1 tablet (8 mg total) by mouth 2 (two) times daily as needed for refractory nausea / vomiting. Start on day 2 after bendamustine chemo. 30 tablet 1  . prochlorperazine (COMPAZINE) 10 MG tablet Take 1 tablet (10 mg total) by mouth every 6 (six) hours as needed (Nausea or vomiting). 30 tablet 1   No current facility-administered medications for this visit.   Facility-Administered Medications Ordered in Other Visits  Medication Dose Route Frequency Provider Last Rate Last Admin  . bendamustine (BENDEKA) 200 mg in sodium chloride 0.9 % 50 mL (3.4483 mg/mL) chemo infusion  90 mg/m2 (Treatment Plan Recorded) Intravenous Once Orson Slick, MD      . dexamethasone (DECADRON) 10 mg in sodium chloride 0.9 % 50 mL IVPB  10 mg Intravenous Once Ledell Peoples IV, MD 204 mL/hr at 04/30/20 0924 10 mg at 04/30/20 0924  . heparin lock flush 100 unit/mL  500 Units Intracatheter Once PRN Orson Slick, MD      . riTUXimab-pvvr (RUXIENCE) 800 mg in sodium chloride 0.9 % 250 mL (2.4242 mg/mL) infusion  375 mg/m2 (Treatment Plan Recorded) Intravenous Once Narda Rutherford T IV, MD      . sodium chloride flush (NS) 0.9 % injection 10 mL  10 mL Intracatheter PRN Orson Slick, MD        REVIEW OF SYSTEMS:   Constitutional: ( - ) fevers, ( - )  chills , ( - ) night sweats Eyes: ( - ) blurriness of vision, ( - ) double vision, ( - ) watery  eyes Ears, nose, mouth, throat, and face: ( - ) mucositis, ( - ) sore throat Respiratory: ( - ) cough, ( - ) dyspnea, ( - ) wheezes Cardiovascular: ( - ) palpitation, ( - ) chest discomfort, ( - ) lower extremity swelling Gastrointestinal:  ( - ) nausea, ( - ) heartburn, ( - ) change in bowel habits Skin: ( - ) abnormal skin rashes Lymphatics: ( - ) new lymphadenopathy, ( - ) easy bruising Neurological: ( - ) numbness, ( - ) tingling, ( - ) new weaknesses Behavioral/Psych: ( - ) mood change, ( - ) new changes  All other systems were reviewed with the patient and are negative.  PHYSICAL EXAMINATION: ECOG PERFORMANCE STATUS: 1 - Symptomatic but completely ambulatory  Vitals:   04/30/20 0828  BP: 130/79  Pulse: 60  Resp: 18  Temp: 97.7 F (36.5 C)  SpO2: 100%   Filed Weights   04/30/20 0828  Weight: 218 lb 11.2 oz (99.2 kg)    GENERAL: well appearing middle aged Caucasian male in NAD  SKIN: skin color, texture, turgor are normal, no rashes or significant lesions EYES: conjunctiva are pink and non-injected, sclera clear NECK: supple, non-tender LYMPH:  palpable lymphadenopathy in the cervical (L >R) cervical chain LUNGS: clear to auscultation and percussion with normal breathing effort HEART: regular rate & rhythm and no murmurs and no lower extremity edema ABDOMEN: soft, non-tender, non-distended, normal bowel sounds. No HSM appreciated.  Musculoskeletal: no cyanosis of digits and no clubbing  PSYCH: alert & oriented x 3, fluent  speech NEURO: no focal motor/sensory deficits  LABORATORY DATA:  I have reviewed the data as listed CBC Latest Ref Rng & Units 04/30/2020 04/09/2020 02/29/2020  WBC 4.0 - 10.5 K/uL 4.9 4.8 4.4  Hemoglobin 13.0 - 17.0 g/dL 11.3(L) 12.0(L) 11.2(L)  Hematocrit 39 - 52 % 34.4(L) 37.0(L) 34.5(L)  Platelets 150 - 400 K/uL 208 172 151    CMP Latest Ref Rng & Units 04/30/2020 02/29/2020 01/31/2020  Glucose 70 - 99 mg/dL 86 118(H) 122(H)  BUN 8 - 23 mg/dL 30(H) 16 18  Creatinine 0.61 - 1.24 mg/dL 1.24 1.05 0.93  Sodium 135 - 145 mmol/L 138 141 142  Potassium 3.5 - 5.1 mmol/L 4.5 4.1 4.0  Chloride 98 - 111 mmol/L 105 103 105  CO2 22 - 32 mmol/L 24 26 27   Calcium 8.9 - 10.3 mg/dL 9.7 9.1 9.6  Total Protein 6.5 - 8.1 g/dL 7.9 7.4 7.3  Total Bilirubin 0.3 - 1.2 mg/dL 0.5 0.7 0.9  Alkaline Phos 38 - 126 U/L 99 104 100  AST 15 - 41 U/L 14(L) 15 16  ALT 0 - 44 U/L 11 7 8    RADIOGRAPHIC STUDIES: I have personally reviewed the radiological images as listed and agreed with the findings in the report: lymphadenopathy in the neck, chest, and abdomen, with most pronounced nodes in the abdomen. Bone also has more FDG avidity than would be expected.   IR IMAGING GUIDED PORT INSERTION  Result Date: 04/09/2020 CLINICAL DATA:  Follicular lymphoma EXAM: RIGHT INTERNAL JUGULAR SINGLE LUMEN POWER PORT CATHETER INSERTION Date:  04/09/2020 04/09/2020 2:58 pm Radiologist:  Jerilynn Mages. Daryll Brod, MD Guidance:  Ultrasound and fluoroscopic MEDICATIONS: Ancef 2 g; The antibiotic was administered within an appropriate time interval prior to skin puncture. ANESTHESIA/SEDATION: Versed 2.0 mg IV; Fentanyl 100 mcg IV; Moderate Sedation Time:  20 minutes The patient was continuously monitored during the procedure by the interventional radiology nurse under my direct supervision. FLUOROSCOPY TIME:  0 minutes, 42 seconds (10 mGy) COMPLICATIONS: None immediate. CONTRAST:  None. PROCEDURE: Informed consent was obtained from the patient following  explanation of the procedure, risks, benefits and alternatives. The patient understands, agrees and consents for the procedure. All questions were addressed. A time out was performed. Maximal barrier sterile technique utilized including caps, mask, sterile gowns, sterile gloves, large sterile drape, hand hygiene, and 2% chlorhexidine scrub. Under sterile conditions and local anesthesia, right internal jugular micropuncture venous access was performed. Access was performed with ultrasound. Images were obtained for documentation of the patent right internal jugular vein. A guide wire was inserted followed by a transitional dilator. This allowed insertion of a guide wire and catheter into the IVC. Measurements were obtained from the SVC / RA junction back to the right IJ venotomy site. In the right infraclavicular chest, a subcutaneous pocket was created over the second anterior rib. This was done under sterile conditions and local anesthesia. 1% lidocaine with epinephrine was utilized for this. A 2.5 cm incision was made in the skin. Blunt dissection was performed to create a subcutaneous pocket over the right pectoralis major muscle. The pocket was flushed with saline vigorously. There was adequate hemostasis. The port catheter was assembled and checked for leakage. The port catheter was secured in the pocket with two retention sutures. The tubing was tunneled subcutaneously to the right venotomy site and inserted into the SVC/RA junction through a valved peel-away sheath. Position was confirmed with fluoroscopy. Images were obtained for documentation. The patient tolerated the procedure well. No immediate complications. Incisions were closed in a two layer fashion with 4 - 0 Vicryl suture. Dermabond was applied to the skin. The port catheter was accessed, blood was aspirated followed by saline and heparin flushes. Needle was removed. A dry sterile dressing was applied. IMPRESSION: Ultrasound and fluoroscopically  guided right internal jugular single lumen power port catheter insertion. Tip in the SVC/RA junction. Catheter ready for use. Electronically Signed   By: Jerilynn Mages.  Shick M.D.   On: 04/09/2020 15:09    ASSESSMENT & PLAN Jorge Gould 64 y.o. male with medical history significant for newly diagnosed follicular lymphoma who presents for a follow up visit. Today is Cycle 1 Day 1 of Bendamustine/Rituximab therapy.  On exam today Jorge Gould appears to be at his baseline level of health.  He has not developed any fevers, chills, sweats, nausea, vomiting or diarrhea in the interim since his last visit.  He reports that he feels well and is ready to start treatment with chemotherapy.  Today we discussed once again the importance of hydration, symptom management, and supportive medications.  He voiced his understanding of the plan moving forward and is prepared for cycle 1 day 1.  FLIPI-2 Score: 4  (high risk). Indication for treatment: bulky lymphadenopathy with 15 x 9 cm conglomerate of lymph nodes in the abdomen.  The regimen of choice for this patient is Bendamustine/Rituximab. Treatment will consist of 6 cycles, each lasting 28 days. The patient will receive Bendamustine 90mg /m2 IV on Day 1 and Day 2 in addition to Rituximab 375mg /m2 IV on Day 1. This is being administered with curative intent. Administered x 6-8 cycles.   # Follicular Lymphoma. Stage III --today is Cycle 1 Day 1 of R-Benda therapy. This is being administered with curative intent. --plan for repeat imaging 3 months after start of therapy to assure response to therapy.  --supportive treatments as noted below.  --RTC in 4 weeks for Cycle 2 Day 1 with interval  weekly lab checks.   #Fertility Preservation  --referred patient to [facility] for Fertility Clinic and Sperm preservation.  --unfortunately patient was noted to be sterile on exam, no sperm to freeze.   #Supportive Care --provided patient with zofran 8mg  PO q8H PRN and compazine 10mg  PO  q6H PRN for antiemetic therapy  --acyclovir 300mg  PO daily for VZV prophyalxis --continue allopurinol 300mg  PO daily as TLS prophylaxis.  --do not anticipate need for GCSF or transfusion support  No orders of the defined types were placed in this encounter.  All questions were answered. The patient knows to call the clinic with any problems, questions or concerns.  A total of more than 30 minutes were spent on this encounter and over half of that time was spent on counseling and coordination of care as outlined above.   Ledell Peoples, MD Department of Hematology/Oncology [place] [facility] Phone: [phone] Pager: [phone] Email: [email or web address]  04/30/2020 9:35 AM

## 2020-04-30 NOTE — Progress Notes (Signed)
Patient requested to leave needle in for tomorrow's treatment. Take home dressing and biopatch applied!

## 2020-04-30 NOTE — Progress Notes (Signed)
First time [place]. Pt tolerated both very well without complaint. Nutrition given. Education completed. Questions answered.

## 2020-04-30 NOTE — Patient Instructions (Signed)
[facility] Discharge Instructions for Patients Receiving Chemotherapy  Today you received the following chemotherapy agents Bendeka, Rituxan.  To help prevent nausea and vomiting after your treatment, we encourage you to take your nausea medication. DO NOT TAKE ZOFRAN FOR THREE DAYS AFTER TREATMENT.   If you develop nausea and vomiting that is not controlled by your nausea medication, call the clinic.   BELOW ARE SYMPTOMS THAT SHOULD BE REPORTED IMMEDIATELY:  *FEVER GREATER THAN 100.5 F  *CHILLS WITH OR WITHOUT FEVER  NAUSEA AND VOMITING THAT IS NOT CONTROLLED WITH YOUR NAUSEA MEDICATION  *UNUSUAL SHORTNESS OF BREATH  *UNUSUAL BRUISING OR BLEEDING  TENDERNESS IN MOUTH AND THROAT WITH OR WITHOUT PRESENCE OF ULCERS  *URINARY PROBLEMS  *BOWEL PROBLEMS  UNUSUAL RASH Items with * indicate a potential emergency and should be followed up as soon as possible.  Feel free to call the clinic should you have any questions or concerns. The clinic phone number is [phone].  Please show the [facility] at check-in to the Emergency Department and triage nurse.  Bendamustine Injection What is this medicine? BENDAMUSTINE (BEN da MUS teen) is a chemotherapy drug. It is used to treat chronic lymphocytic leukemia and non-Hodgkin lymphoma. This medicine may be used for other purposes; ask your health care provider or pharmacist if you have questions. COMMON BRAND NAME(S): Kristine Royal, Treanda What should I tell my health care provider before I take this medicine? They need to know if you have any of these conditions:  infection (especially a virus infection such as chickenpox, cold sores, or herpes)  kidney disease  liver disease  an unusual or allergic reaction to bendamustine, mannitol, other medicines, foods, dyes, or preservatives  pregnant or trying to get pregnant  breast-feeding How should I use this medicine? This medicine is for infusion into a  vein. It is given by a health care professional in a hospital or clinic setting. Talk to your pediatrician regarding the use of this medicine in children. Special care may be needed. Overdosage: If you think you have taken too much of this medicine contact a poison control center or emergency room at once. NOTE: This medicine is only for you. Do not share this medicine with others. What if I miss a dose? It is important not to miss your dose. Call your doctor or health care professional if you are unable to keep an appointment. What may interact with this medicine? Do not take this medicine with any of the following medications:  clozapine This medicine may also interact with the following medications:  atazanavir  cimetidine  ciprofloxacin  enoxacin  fluvoxamine  medicines for seizures like carbamazepine and phenobarbital  mexiletine  rifampin  tacrine  thiabendazole  zileuton This list may not describe all possible interactions. Give your health care provider a list of all the medicines, herbs, non-prescription drugs, or dietary supplements you use. Also tell them if you smoke, drink alcohol, or use illegal drugs. Some items may interact with your medicine. What should I watch for while using this medicine? This drug may make you feel generally unwell. This is not uncommon, as chemotherapy can affect healthy cells as well as cancer cells. Report any side effects. Continue your course of treatment even though you feel ill unless your doctor tells you to stop. You may need blood work done while you are taking this medicine. Call your doctor or healthcare provider for advice if you get a fever, chills or sore throat, or other  symptoms of a cold or flu. Do not treat yourself. This drug decreases your body's ability to fight infections. Try to avoid being around people who are sick. This medicine may cause serious skin reactions. They can happen weeks to months after starting the  medicine. Contact your healthcare provider right away if you notice fevers or flu-like symptoms with a rash. The rash may be red or purple and then turn into blisters or peeling of the skin. Or, you might notice a red rash with swelling of the face, lips or lymph nodes in your neck or under your arms. This medicine may increase your risk to bruise or bleed. Call your doctor or healthcare provider if you notice any unusual bleeding. Talk to your doctor about your risk of cancer. You may be more at risk for certain types of cancers if you take this medicine. Do not become pregnant while taking this medicine or for at least 6 months after stopping it. Women should inform their doctor if they wish to become pregnant or think they might be pregnant. Men should not father a child while taking this medicine and for at least 3 months after stopping it. There is a potential for serious side effects to an unborn child. Talk to your healthcare provider or pharmacist for more information. Do not breast-feed an infant while taking this medicine or for at least 1 week after stopping it. This medicine may make it more difficult to father a child. You should talk with your doctor or healthcare provider if you are concerned about your fertility. What side effects may I notice from receiving this medicine? Side effects that you should report to your doctor or health care professional as soon as possible:  allergic reactions like skin rash, itching or hives, swelling of the face, lips, or tongue  low blood counts - this medicine may decrease the number of white blood cells, red blood cells and platelets. You may be at increased risk for infections and bleeding.  rash, fever, and swollen lymph nodes  redness, blistering, peeling, or loosening of the skin, including inside the mouth  signs of infection like fever or chills, cough, sore throat, pain or difficulty passing urine  signs of decreased platelets or bleeding  like bruising, pinpoint red spots on the skin, black, tarry stools, blood in the urine  signs of decreased red blood cells like being unusually weak or tired, fainting spells, lightheadedness  signs and symptoms of kidney injury like trouble passing urine or change in the amount of urine  signs and symptoms of liver injury like dark yellow or brown urine; general ill feeling or flu-like symptoms; light-colored stools; loss of appetite; nausea; right upper belly pain; unusually weak or tired; yellowing of the eyes or skin Side effects that usually do not require medical attention (report to your doctor or health care professional if they continue or are bothersome):  constipation  decreased appetite  diarrhea  headache  mouth sores  nausea, vomiting  tiredness This list may not describe all possible side effects. Call your doctor for medical advice about side effects. You may report side effects to FDA at 1-800-FDA-1088. Where should I keep my medicine? This drug is given in a hospital or clinic and will not be stored at home. NOTE: This sheet is a summary. It may not cover all possible information. If you have questions about this medicine, talk to your doctor, pharmacist, or health care provider.  2020 Elsevier/Gold Standard (2019-01-16 10:26:46) Rituximab injection  What is this medicine? RITUXIMAB (ri TUX i mab) is a monoclonal antibody. It is used to treat certain types of cancer like non-Hodgkin lymphoma and chronic lymphocytic leukemia. It is also used to treat rheumatoid arthritis, granulomatosis with polyangiitis (or Wegener's granulomatosis), microscopic polyangiitis, and pemphigus vulgaris. This medicine may be used for other purposes; ask your health care provider or pharmacist if you have questions. COMMON BRAND NAME(S): Rituxan, RUXIENCE What should I tell my health care provider before I take this medicine? They need to know if you have any of these conditions:  heart  disease  infection (especially a virus infection such as hepatitis B, chickenpox, cold sores, or herpes)  immune system problems  irregular heartbeat  kidney disease  low blood counts, like low white cell, platelet, or red cell counts  lung or breathing disease, like asthma  recently received or scheduled to receive a vaccine  an unusual or allergic reaction to rituximab, other medicines, foods, dyes, or preservatives  pregnant or trying to get pregnant  breast-feeding How should I use this medicine? This medicine is for infusion into a vein. It is administered in a hospital or clinic by a specially trained health care professional. A special MedGuide will be given to you by the pharmacist with each prescription and refill. Be sure to read this information carefully each time. Talk to your pediatrician regarding the use of this medicine in children. This medicine is not approved for use in children. Overdosage: If you think you have taken too much of this medicine contact a poison control center or emergency room at once. NOTE: This medicine is only for you. Do not share this medicine with others. What if I miss a dose? It is important not to miss a dose. Call your doctor or health care professional if you are unable to keep an appointment. What may interact with this medicine?  cisplatin  live virus vaccines This list may not describe all possible interactions. Give your health care provider a list of all the medicines, herbs, non-prescription drugs, or dietary supplements you use. Also tell them if you smoke, drink alcohol, or use illegal drugs. Some items may interact with your medicine. What should I watch for while using this medicine? Your condition will be monitored carefully while you are receiving this medicine. You may need blood work done while you are taking this medicine. This medicine can cause serious allergic reactions. To reduce your risk you may need to take  medicine before treatment with this medicine. Take your medicine as directed. In some patients, this medicine may cause a serious brain infection that may cause death. If you have any problems seeing, thinking, speaking, walking, or standing, tell your healthcare professional right away. If you cannot reach your healthcare professional, urgently seek other source of medical care. Call your doctor or health care professional for advice if you get a fever, chills or sore throat, or other symptoms of a cold or flu. Do not treat yourself. This drug decreases your body's ability to fight infections. Try to avoid being around people who are sick. Do not become pregnant while taking this medicine or for at least 12 months after stopping it. Women should inform their doctor if they wish to become pregnant or think they might be pregnant. There is a potential for serious side effects to an unborn child. Talk to your health care professional or pharmacist for more information. Do not breast-feed an infant while taking this medicine or for at least  6 months after stopping it. What side effects may I notice from receiving this medicine? Side effects that you should report to your doctor or health care professional as soon as possible:  allergic reactions like skin rash, itching or hives; swelling of the face, lips, or tongue  breathing problems  chest pain  changes in vision  diarrhea  headache with fever, neck stiffness, sensitivity to light, nausea, or confusion  fast, irregular heartbeat  loss of memory  low blood counts - this medicine may decrease the number of white blood cells, red blood cells and platelets. You may be at increased risk for infections and bleeding.  mouth sores  problems with balance, talking, or walking  redness, blistering, peeling or loosening of the skin, including inside the mouth  signs of infection - fever or chills, cough, sore throat, pain or difficulty passing  urine  signs and symptoms of kidney injury like trouble passing urine or change in the amount of urine  signs and symptoms of liver injury like dark yellow or brown urine; general ill feeling or flu-like symptoms; light-colored stools; loss of appetite; nausea; right upper belly pain; unusually weak or tired; yellowing of the eyes or skin  signs and symptoms of low blood pressure like dizziness; feeling faint or lightheaded, falls; unusually weak or tired  stomach pain  swelling of the ankles, feet, hands  unusual bleeding or bruising  vomiting Side effects that usually do not require medical attention (report to your doctor or health care professional if they continue or are bothersome):  headache  joint pain  muscle cramps or muscle pain  nausea  tiredness This list may not describe all possible side effects. Call your doctor for medical advice about side effects. You may report side effects to FDA at 1-800-FDA-1088. Where should I keep my medicine? This drug is given in a hospital or clinic and will not be stored at home. NOTE: This sheet is a summary. It may not cover all possible information. If you have questions about this medicine, talk to your doctor, pharmacist, or health care provider.  2020 Elsevier/Gold Standard (2018-12-06 22:01:36)

## 2020-05-01 ENCOUNTER — Inpatient Hospital Stay: Payer: Medicare Other

## 2020-05-01 ENCOUNTER — Other Ambulatory Visit: Payer: Medicare Other

## 2020-05-01 ENCOUNTER — Ambulatory Visit: Payer: Medicare Other | Admitting: Hematology and Oncology

## 2020-05-01 ENCOUNTER — Other Ambulatory Visit: Payer: Self-pay

## 2020-05-01 VITALS — BP 144/73 | HR 57 | Temp 98.1°F | Resp 18

## 2020-05-01 DIAGNOSIS — C8233 Follicular lymphoma grade IIIa, intra-abdominal lymph nodes: Secondary | ICD-10-CM

## 2020-05-01 DIAGNOSIS — Z5111 Encounter for antineoplastic chemotherapy: Secondary | ICD-10-CM | POA: Diagnosis not present

## 2020-05-01 DIAGNOSIS — Z79899 Other long term (current) drug therapy: Secondary | ICD-10-CM | POA: Diagnosis not present

## 2020-05-01 DIAGNOSIS — C8290 Follicular lymphoma, unspecified, unspecified site: Secondary | ICD-10-CM | POA: Diagnosis not present

## 2020-05-01 MED ORDER — SODIUM CHLORIDE 0.9% FLUSH
10.0000 mL | INTRAVENOUS | Status: DC | PRN
  Administered 2020-05-01: 10 mL
  Filled 2020-05-01: qty 10

## 2020-05-01 MED ORDER — SODIUM CHLORIDE 0.9 % IV SOLN
10.0000 mg | Freq: Once | INTRAVENOUS | Status: AC
  Administered 2020-05-01: 10 mg via INTRAVENOUS
  Filled 2020-05-01: qty 10

## 2020-05-01 MED ORDER — SODIUM CHLORIDE 0.9 % IV SOLN
Freq: Once | INTRAVENOUS | Status: AC
  Filled 2020-05-01: qty 250

## 2020-05-01 MED ORDER — SODIUM CHLORIDE 0.9 % IV SOLN
90.0000 mg/m2 | Freq: Once | INTRAVENOUS | Status: AC
  Administered 2020-05-01: 200 mg via INTRAVENOUS
  Filled 2020-05-01: qty 8

## 2020-05-01 MED ORDER — HEPARIN SOD (PORK) LOCK FLUSH 100 UNIT/ML IV SOLN
500.0000 [IU] | Freq: Once | INTRAVENOUS | Status: AC | PRN
  Administered 2020-05-01: 500 [IU]
  Filled 2020-05-01: qty 5

## 2020-05-01 NOTE — Patient Instructions (Signed)
[facility] Discharge Instructions for Patients Receiving Chemotherapy  Today you received the following chemotherapy agent: Bendamustine (Bendeka)  To help prevent nausea and vomiting after your treatment, we encourage you to take your nausea medication as directed by your MD.   If you develop nausea and vomiting that is not controlled by your nausea medication, call the clinic.   BELOW ARE SYMPTOMS THAT SHOULD BE REPORTED IMMEDIATELY:  *FEVER GREATER THAN 100.5 F  *CHILLS WITH OR WITHOUT FEVER  NAUSEA AND VOMITING THAT IS NOT CONTROLLED WITH YOUR NAUSEA MEDICATION  *UNUSUAL SHORTNESS OF BREATH  *UNUSUAL BRUISING OR BLEEDING  TENDERNESS IN MOUTH AND THROAT WITH OR WITHOUT PRESENCE OF ULCERS  *URINARY PROBLEMS  *BOWEL PROBLEMS  UNUSUAL RASH Items with * indicate a potential emergency and should be followed up as soon as possible.  Feel free to call the clinic should you have any questions or concerns. The clinic phone number is [phone].  Please show the CHEMO ALERT CARD at check-in to the Emergency Department and triage nurse.   

## 2020-05-07 ENCOUNTER — Inpatient Hospital Stay: Payer: Medicare Other

## 2020-05-14 ENCOUNTER — Telehealth: Payer: Self-pay | Admitting: *Deleted

## 2020-05-14 ENCOUNTER — Inpatient Hospital Stay: Payer: Medicare Other

## 2020-05-14 ENCOUNTER — Inpatient Hospital Stay: Payer: Medicare Other | Attending: Hematology and Oncology

## 2020-05-14 ENCOUNTER — Other Ambulatory Visit: Payer: Self-pay | Admitting: Hematology and Oncology

## 2020-05-14 ENCOUNTER — Encounter (INDEPENDENT_AMBULATORY_CARE_PROVIDER_SITE_OTHER): Payer: Self-pay

## 2020-05-14 ENCOUNTER — Other Ambulatory Visit: Payer: Self-pay

## 2020-05-14 DIAGNOSIS — Z79899 Other long term (current) drug therapy: Secondary | ICD-10-CM | POA: Diagnosis not present

## 2020-05-14 DIAGNOSIS — C8233 Follicular lymphoma grade IIIa, intra-abdominal lymph nodes: Secondary | ICD-10-CM

## 2020-05-14 DIAGNOSIS — C8290 Follicular lymphoma, unspecified, unspecified site: Secondary | ICD-10-CM | POA: Diagnosis present

## 2020-05-14 DIAGNOSIS — Z5111 Encounter for antineoplastic chemotherapy: Secondary | ICD-10-CM | POA: Diagnosis present

## 2020-05-14 DIAGNOSIS — Z95828 Presence of other vascular implants and grafts: Secondary | ICD-10-CM | POA: Insufficient documentation

## 2020-05-14 LAB — CBC WITH DIFFERENTIAL (CANCER CENTER ONLY)
Abs Immature Granulocytes: 0.02 10*3/uL (ref 0.00–0.07)
Basophils Absolute: 0 10*3/uL (ref 0.0–0.1)
Basophils Relative: 1 %
Eosinophils Absolute: 0.4 10*3/uL (ref 0.0–0.5)
Eosinophils Relative: 9 %
HCT: 31.9 % — ABNORMAL LOW (ref 39.0–52.0)
Hemoglobin: 10.5 g/dL — ABNORMAL LOW (ref 13.0–17.0)
Immature Granulocytes: 0 %
Lymphocytes Relative: 12 %
Lymphs Abs: 0.6 10*3/uL — ABNORMAL LOW (ref 0.7–4.0)
MCH: 30.2 pg (ref 26.0–34.0)
MCHC: 32.9 g/dL (ref 30.0–36.0)
MCV: 91.7 fL (ref 80.0–100.0)
Monocytes Absolute: 0.6 10*3/uL (ref 0.1–1.0)
Monocytes Relative: 13 %
Neutro Abs: 3 10*3/uL (ref 1.7–7.7)
Neutrophils Relative %: 65 %
Platelet Count: 233 10*3/uL (ref 150–400)
RBC: 3.48 MIL/uL — ABNORMAL LOW (ref 4.22–5.81)
RDW: 15.5 % (ref 11.5–15.5)
WBC Count: 4.6 10*3/uL (ref 4.0–10.5)
nRBC: 0 % (ref 0.0–0.2)

## 2020-05-14 LAB — CMP (CANCER CENTER ONLY)
ALT: 14 U/L (ref 0–44)
AST: 15 U/L (ref 15–41)
Albumin: 3.5 g/dL (ref 3.5–5.0)
Alkaline Phosphatase: 96 U/L (ref 38–126)
Anion gap: 8 (ref 5–15)
BUN: 23 mg/dL (ref 8–23)
CO2: 23 mmol/L (ref 22–32)
Calcium: 8.9 mg/dL (ref 8.9–10.3)
Chloride: 107 mmol/L (ref 98–111)
Creatinine: 1.04 mg/dL (ref 0.61–1.24)
GFR, Est AFR Am: >60 mL/min (ref >60)
GFR, Estimated: >60 mL/min (ref >60)
Glucose, Bld: 93 mg/dL (ref 70–99)
Potassium: 4.1 mmol/L (ref 3.5–5.1)
Sodium: 138 mmol/L (ref 135–145)
Total Bilirubin: 0.5 mg/dL (ref 0.3–1.2)
Total Protein: 7.1 g/dL (ref 6.5–8.1)

## 2020-05-14 LAB — LACTATE DEHYDROGENASE: LDH: 184 U/L (ref 98–192)

## 2020-05-14 MED ORDER — SODIUM CHLORIDE 0.9% FLUSH
10.0000 mL | Freq: Once | INTRAVENOUS | Status: AC
  Administered 2020-05-14: 10 mL
  Filled 2020-05-14: qty 10

## 2020-05-14 MED ORDER — HEPARIN SOD (PORK) LOCK FLUSH 100 UNIT/ML IV SOLN
500.0000 [IU] | Freq: Once | INTRAVENOUS | Status: AC
  Administered 2020-05-14: 500 [IU]
  Filled 2020-05-14: qty 5

## 2020-05-14 NOTE — Telephone Encounter (Signed)
-----   Message from Orson Slick, MD sent at 05/14/2020  3:20 PM EDT ----- Please let [name] know that his labs look good overall, but his Hgb dropped to 10.5. Please tell him not to push himself too hard, particularly if he is short of breath. ----- Message ----- From: Buel Ream, Lab In [place] Sent: 05/14/2020  12:12 PM EDT To: Orson Slick, MD

## 2020-05-14 NOTE — Telephone Encounter (Signed)
TCT patient and spoke with him. Advised that his labs today, revealed a drop in in his HGb. Explained that to him and advised that he not over  Exert himself, that he may feel more short of breath and/or fatigued that usual. He voiced understanding.

## 2020-05-15 NOTE — Progress Notes (Signed)
Rapid Infusion Rituximab Pharmacist Evaluation  KOTARO BUER is a 64 y.o. male being treated with rituximab for lymphoma. This patient may be considered for RIR.   A pharmacist has verified the patient tolerated rituximab infusions per the [facility] standard infusion protocol without grade 3-4 infusion reactions. The treatment plan will be updated to reflect RIR if the patient qualifies per the checklist below:   Age > 90 years old Yes   Clinically significant cardiovascular disease No   Circulating lymphocyte count < 5000/uL prior to cycle two No  Lab Results  Component Value Date   LYMPHSABS 0.6 (L) 05/14/2020    Prior documented grade 3-4 infusion reaction to rituximab No   Prior documented grade 1-2 infusion reaction to rituximab (If YES, Pharmacist will confirm with Physician if patient is still a candidate for RIR) No   Previous rituximab infusion within the past 6 months Yes   Treatment Plan updated orders to reflect RIR Yes    ZEDRICK SPRINGSTEEN does meet the criteria for Rapid Infusion Rituximab. This patient is going to be switched to rapid infusion rituximab.    Kennith Center, Pharm.D., CPP 05/15/2020@12 :00 PM

## 2020-05-21 ENCOUNTER — Inpatient Hospital Stay: Payer: Medicare Other

## 2020-05-21 ENCOUNTER — Other Ambulatory Visit: Payer: Self-pay | Admitting: Hematology and Oncology

## 2020-05-21 DIAGNOSIS — C8233 Follicular lymphoma grade IIIa, intra-abdominal lymph nodes: Secondary | ICD-10-CM

## 2020-05-21 DIAGNOSIS — R591 Generalized enlarged lymph nodes: Secondary | ICD-10-CM

## 2020-05-23 ENCOUNTER — Inpatient Hospital Stay: Payer: Medicare Other

## 2020-05-23 ENCOUNTER — Other Ambulatory Visit: Payer: Self-pay

## 2020-05-23 DIAGNOSIS — Z5111 Encounter for antineoplastic chemotherapy: Secondary | ICD-10-CM | POA: Diagnosis not present

## 2020-05-23 DIAGNOSIS — C8233 Follicular lymphoma grade IIIa, intra-abdominal lymph nodes: Secondary | ICD-10-CM

## 2020-05-23 DIAGNOSIS — R591 Generalized enlarged lymph nodes: Secondary | ICD-10-CM

## 2020-05-23 DIAGNOSIS — Z95828 Presence of other vascular implants and grafts: Secondary | ICD-10-CM

## 2020-05-23 LAB — CMP (CANCER CENTER ONLY)
ALT: 12 U/L (ref 0–44)
AST: 12 U/L — ABNORMAL LOW (ref 15–41)
Albumin: 3.5 g/dL (ref 3.5–5.0)
Alkaline Phosphatase: 124 U/L (ref 38–126)
Anion gap: 10 (ref 5–15)
BUN: 18 mg/dL (ref 8–23)
CO2: 24 mmol/L (ref 22–32)
Calcium: 9 mg/dL (ref 8.9–10.3)
Chloride: 107 mmol/L (ref 98–111)
Creatinine: 1.2 mg/dL (ref 0.61–1.24)
GFR, Est AFR Am: >60 mL/min (ref >60)
GFR, Estimated: >60 mL/min (ref >60)
Glucose, Bld: 98 mg/dL (ref 70–99)
Potassium: 3.9 mmol/L (ref 3.5–5.1)
Sodium: 141 mmol/L (ref 135–145)
Total Bilirubin: 0.5 mg/dL (ref 0.3–1.2)
Total Protein: 6.9 g/dL (ref 6.5–8.1)

## 2020-05-23 LAB — CBC WITH DIFFERENTIAL (CANCER CENTER ONLY)
Abs Immature Granulocytes: 0.02 10*3/uL (ref 0.00–0.07)
Basophils Absolute: 0 10*3/uL (ref 0.0–0.1)
Basophils Relative: 0 %
Eosinophils Absolute: 0.3 10*3/uL (ref 0.0–0.5)
Eosinophils Relative: 7 %
HCT: 32.6 % — ABNORMAL LOW (ref 39.0–52.0)
Hemoglobin: 10.7 g/dL — ABNORMAL LOW (ref 13.0–17.0)
Immature Granulocytes: 1 %
Lymphocytes Relative: 15 %
Lymphs Abs: 0.6 10*3/uL — ABNORMAL LOW (ref 0.7–4.0)
MCH: 30.1 pg (ref 26.0–34.0)
MCHC: 32.8 g/dL (ref 30.0–36.0)
MCV: 91.6 fL (ref 80.0–100.0)
Monocytes Absolute: 0.5 10*3/uL (ref 0.1–1.0)
Monocytes Relative: 13 %
Neutro Abs: 2.5 10*3/uL (ref 1.7–7.7)
Neutrophils Relative %: 64 %
Platelet Count: 168 10*3/uL (ref 150–400)
RBC: 3.56 MIL/uL — ABNORMAL LOW (ref 4.22–5.81)
RDW: 15.5 % (ref 11.5–15.5)
WBC Count: 3.9 10*3/uL — ABNORMAL LOW (ref 4.0–10.5)
nRBC: 0 % (ref 0.0–0.2)

## 2020-05-23 LAB — LACTATE DEHYDROGENASE: LDH: 159 U/L (ref 98–192)

## 2020-05-23 MED ORDER — HEPARIN SOD (PORK) LOCK FLUSH 100 UNIT/ML IV SOLN
500.0000 [IU] | Freq: Once | INTRAVENOUS | Status: AC
  Administered 2020-05-23: 500 [IU]
  Filled 2020-05-23: qty 5

## 2020-05-23 MED ORDER — SODIUM CHLORIDE 0.9% FLUSH
10.0000 mL | Freq: Once | INTRAVENOUS | Status: AC
  Administered 2020-05-23: 10 mL
  Filled 2020-05-23: qty 10

## 2020-05-27 NOTE — Progress Notes (Signed)
[place] [phone]   [phone]  PROGRESS NOTE  Patient Care Team: Tysinger, Leward Quan as PCP - General (Family Medicine)  Hematological/Oncological History # Follicular Lymphoma. Stage III 1) 01/07/2020: CT Neck W contrast showed cervical lymphadenopathy bilaterally, left greater than right. Numerous enlarged lymph nodes are present 2)  01/22/2020: [place] Core biopsy performed which revealed overall features consistent with involvement by non-Hodgkin's B-cell lymphoma and the morphologic and phenotypic findings favor follicular lymphoma.  3) 01/31/2020: establish care with Dr. Lorenso Courier  4) 02/12/2020: PET CT scan demonstrates bulky adenopathy in the neck, chest, abdomen and pelvis and diffuse skeletal uptake is suspicious for but not diagnostic of marrow involvement. 5) 04/30/2020: Cycle 1 Day 1 of Bendamustine/Rituximab 6) 05/29/2020: intended start of Cycle 2 Day 1 of Bendamustine/Rituximab  Interval History:  Jorge Gould 64 y.o. male with medical history significant for Stage III follicular lymphoma who presents for a follow up visit. The patient's last visit was on 04/30/2020 at which time he started Cycle 1 Day 1 of Bendamustine/Rituximab. In the interim since the last visit Jorge Gould has completed Cycle 1 of therapy with stable counts and no ED visits/hospitailzations.   On exam today Jorge Gould reports that he has "never slowed down".  He notes he tolerated the chemotherapy quite well and has been able to work at full capacity following his treatment course.  He notes that he was never subject any symptoms such as nausea, vomiting, or diarrhea.  He notes that he did have some firm stools a few days after the treatment, but that resolved quite quickly without any intervention.  He notes that he was able to go on fishing trips and work without any difficulty.  He denies having any bleeding, bruising, or dark stools.  He does note that he is having some  issues with erectile dysfunction for which he will be seeing his primary care provider later today.  Otherwise he denies having any new symptoms and is ready to start cycle 2 of treatment.  A full 10 point ROS is listed below.  MEDICAL HISTORY:  Past Medical History:  Diagnosis Date  . Obesity     SURGICAL HISTORY: Past Surgical History:  Procedure Laterality Date  . ANKLE FRACTURE SURGERY  2016   right, trauma repair after fall down stairs  . HERNIA REPAIR     umbilical  . IR IMAGING GUIDED PORT INSERTION  04/09/2020  . TONSILLECTOMY      SOCIAL HISTORY: Social History   Socioeconomic History  . Marital status: Married    Spouse name: Not on file  . Number of children: Not on file  . Years of education: Not on file  . Highest education level: Not on file  Occupational History  . Not on file  Tobacco Use  . Smoking status: Never Smoker  . Smokeless tobacco: Never Used  Vaping Use  . Vaping Use: Never used  Substance and Sexual Activity  . Alcohol use: Yes    Alcohol/week: 3.0 standard drinks    Types: 3 Shots of liquor per week  . Drug use: Not Currently  . Sexual activity: Not on file  Other Topics Concern  . Not on file  Social History Narrative   Engaged, plan to marry in March 2021, been together since 2014.  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   Most exercise on the job, working 7 days per week.   No children, but fiance has  5 children.    11/2019   Social Determinants of Health   Financial Resource Strain:   . Difficulty of Paying Living Expenses:   Food Insecurity:   . Worried About Charity fundraiser in the Last Year:   . Arboriculturist in the Last Year:   Transportation Needs:   . Film/video editor (Medical):   Marland Kitchen Lack of Transportation (Non-Medical):   Physical Activity:   . Days of Exercise per Week:   . Minutes of Exercise per Session:   Stress:   . Feeling of Stress :   Social Connections:   . Frequency of Communication  with Friends and Family:   . Frequency of Social Gatherings with Friends and Family:   . Attends Religious Services:   . Active Member of Clubs or Organizations:   . Attends Archivist Meetings:   Marland Kitchen Marital Status:   Intimate Partner Violence:   . Fear of Current or Ex-Partner:   . Emotionally Abused:   Marland Kitchen Physically Abused:   . Sexually Abused:     FAMILY HISTORY: Family History  Problem Relation Age of Onset  . Cancer Mother        lung; former smoker  . COPD Father        emphysema  . Diabetes Maternal Aunt   . Heart disease Maternal Uncle   . Heart disease Maternal Uncle   . Diabetes Maternal Aunt   . Stroke Neg Hx   . Hyperlipidemia Neg Hx   . Hypertension Neg Hx     ALLERGIES:  has No Known Allergies.  MEDICATIONS:  Current Outpatient Medications  Medication Sig Dispense Refill  . acyclovir (ZOVIRAX) 400 MG tablet Take 1 tablet (400 mg total) by mouth daily. 30 tablet 3  . allopurinol (ZYLOPRIM) 300 MG tablet Take 1 tablet (300 mg total) by mouth daily. 30 tablet 3  . dexamethasone (DECADRON) 4 MG tablet Take 2 tablets (8 mg total) by mouth daily. Start the day after bendamustine chemotherapy for 2 days. Take with food. 30 tablet 1  . lidocaine-prilocaine (EMLA) cream Apply to affected area once 30 g 3  . Naphazoline HCl (CLEAR EYES OP) Place 1 drop into both eyes daily as needed (redness).    . ondansetron (ZOFRAN) 8 MG tablet Take 1 tablet (8 mg total) by mouth 2 (two) times daily as needed for refractory nausea / vomiting. Start on day 2 after bendamustine chemo. 30 tablet 1  . prochlorperazine (COMPAZINE) 10 MG tablet Take 1 tablet (10 mg total) by mouth every 6 (six) hours as needed (Nausea or vomiting). 30 tablet 1   No current facility-administered medications for this visit.    REVIEW OF SYSTEMS:   Constitutional: ( - ) fevers, ( - )  chills , ( - ) night sweats Eyes: ( - ) blurriness of vision, ( - ) double vision, ( - ) watery eyes Ears, nose,  mouth, throat, and face: ( - ) mucositis, ( - ) sore throat Respiratory: ( - ) cough, ( - ) dyspnea, ( - ) wheezes Cardiovascular: ( - ) palpitation, ( - ) chest discomfort, ( - ) lower extremity swelling Gastrointestinal:  ( - ) nausea, ( - ) heartburn, ( - ) change in bowel habits Skin: ( - ) abnormal skin rashes Lymphatics: ( - ) new lymphadenopathy, ( - ) easy bruising Neurological: ( - ) numbness, ( - ) tingling, ( - ) new weaknesses Behavioral/Psych: ( - ) mood  change, ( - ) new changes  All other systems were reviewed with the patient and are negative.  PHYSICAL EXAMINATION: ECOG PERFORMANCE STATUS: 1 - Symptomatic but completely ambulatory  Vitals:   05/28/20 0952  BP: (!) 131/94  Pulse: (!) 49  Resp: 18  Temp: 98.1 F (36.7 C)  SpO2: 100%   Filed Weights   05/28/20 0952  Weight: 219 lb (99.3 kg)    GENERAL: well appearing middle aged Caucasian male in NAD  SKIN: skin color, texture, turgor are normal, no rashes or significant lesions EYES: conjunctiva are pink and non-injected, sclera clear NECK: supple, non-tender LYMPH:  palpable lymphadenopathy in the cervical (L >R) cervical chain LUNGS: clear to auscultation and percussion with normal breathing effort HEART: regular rate & rhythm and no murmurs and no lower extremity edema ABDOMEN: soft, non-tender, non-distended, normal bowel sounds. No HSM appreciated.  Musculoskeletal: no cyanosis of digits and no clubbing  PSYCH: alert & oriented x 3, fluent speech NEURO: no focal motor/sensory deficits  LABORATORY DATA:  I have reviewed the data as listed CBC Latest Ref Rng & Units 05/28/2020 05/23/2020 05/14/2020  WBC 4.0 - 10.5 K/uL 3.7(L) 3.9(L) 4.6  Hemoglobin 13.0 - 17.0 g/dL 12.2(L) 10.7(L) 10.5(L)  Hematocrit 39 - 52 % 36.7(L) 32.6(L) 31.9(L)  Platelets 150 - 400 K/uL 157 168 233    CMP Latest Ref Rng & Units 05/28/2020 05/23/2020 05/14/2020  Glucose 70 - 99 mg/dL 90 98 93  BUN 8 - 23 mg/dL 20 18 23   Creatinine  0.61 - 1.24 mg/dL 0.91 1.20 1.04  Sodium 135 - 145 mmol/L 141 141 138  Potassium 3.5 - 5.1 mmol/L 4.1 3.9 4.1  Chloride 98 - 111 mmol/L 107 107 107  CO2 22 - 32 mmol/L 24 24 23   Calcium 8.9 - 10.3 mg/dL 9.4 9.0 8.9  Total Protein 6.5 - 8.1 g/dL 7.2 6.9 7.1  Total Bilirubin 0.3 - 1.2 mg/dL 0.6 0.5 0.5  Alkaline Phos 38 - 126 U/L 132(H) 124 96  AST 15 - 41 U/L 12(L) 12(L) 15  ALT 0 - 44 U/L 11 12 14    RADIOGRAPHIC STUDIES: I have personally reviewed the radiological images as listed and agreed with the findings in the report: lymphadenopathy in the neck, chest, and abdomen, with most pronounced nodes in the abdomen. Bone also has more FDG avidity than would be expected.   No results found.  ASSESSMENT & PLAN Jorge Gould 64 y.o. male with medical history significant for newly diagnosed follicular lymphoma who presents for a follow up visit. Today is his visit prior to Cycle 2 Day 1 of Bendamustine/Rituximab therapy planned for tomorrow (05/29/2020-05/30/2020).  On exam today Jorge Gould is quite well and virtually asymptomatic after his cycle 1 of treatment.  His blood counts have dipped slightly but the hemoglobin has rebounded and the white blood cell count is still above the threshold for treatment.  Overall he is clear for treatment with cycle 2 of therapy.  FLIPI-2 Score: 4  (high risk). Indication for treatment: bulky lymphadenopathy with 15 x 9 cm conglomerate of lymph nodes in the abdomen.  The regimen of choice for this patient is Bendamustine/Rituximab. Treatment will consist of 6 cycles, each lasting 28 days. The patient will receive Bendamustine 90mg /m2 IV on Day 1 and Day 2 in addition to Rituximab 375mg /m2 IV on Day 1. This is being administered with curative intent. Administered x 6-8 cycles.   # Follicular Lymphoma. Stage III --tomorrow is Cycle 2 Day 1  of R-Benda therapy. This is being administered with curative intent. OK to proceed based on clinic exam and labs.  --plan  for repeat imaging 3 months after start of therapy to assure response to therapy.  --supportive treatments as noted below.  --RTC in 4 weeks for Cycle 3 Day 1 with interval 2 week lab check   #Fertility Preservation  --referred patient to [facility] for Fertility Clinic and Sperm preservation.  --unfortunately patient was noted to be sterile on exam, no sperm to freeze.   #Supportive Care --provided patient with zofran 8mg  PO q8H PRN and compazine 10mg  PO q6H PRN for antiemetic therapy  --acyclovir 300mg  PO daily for VZV prophyalxis --continue allopurinol 300mg  PO daily as TLS prophylaxis.  --do not anticipate need for GCSF or transfusion support  No orders of the defined types were placed in this encounter.  All questions were answered. The patient knows to call the clinic with any problems, questions or concerns.  A total of more than 30 minutes were spent on this encounter and over half of that time was spent on counseling and coordination of care as outlined above.   Ledell Peoples, MD Department of Hematology/Oncology [place] [facility] Phone: [phone] Pager: [phone] Email: [email or web address]  05/28/2020 10:57 AM

## 2020-05-28 ENCOUNTER — Inpatient Hospital Stay: Payer: Medicare Other | Admitting: Hematology and Oncology

## 2020-05-28 ENCOUNTER — Encounter: Payer: Self-pay | Admitting: Medical

## 2020-05-28 ENCOUNTER — Encounter: Payer: Self-pay | Admitting: Hematology and Oncology

## 2020-05-28 ENCOUNTER — Inpatient Hospital Stay: Payer: Medicare Other

## 2020-05-28 ENCOUNTER — Other Ambulatory Visit: Payer: Self-pay | Admitting: Hematology and Oncology

## 2020-05-28 ENCOUNTER — Ambulatory Visit (INDEPENDENT_AMBULATORY_CARE_PROVIDER_SITE_OTHER): Payer: Medicare Other | Admitting: Medical

## 2020-05-28 ENCOUNTER — Other Ambulatory Visit: Payer: Self-pay

## 2020-05-28 VITALS — BP 131/94 | HR 49 | Temp 98.1°F | Resp 18 | Wt 219.0 lb

## 2020-05-28 VITALS — BP 140/76 | HR 52 | Ht 72.0 in | Wt 220.6 lb

## 2020-05-28 DIAGNOSIS — C8290 Follicular lymphoma, unspecified, unspecified site: Secondary | ICD-10-CM | POA: Diagnosis not present

## 2020-05-28 DIAGNOSIS — Z5111 Encounter for antineoplastic chemotherapy: Secondary | ICD-10-CM | POA: Diagnosis not present

## 2020-05-28 DIAGNOSIS — R030 Elevated blood-pressure reading, without diagnosis of hypertension: Secondary | ICD-10-CM | POA: Diagnosis not present

## 2020-05-28 DIAGNOSIS — C8233 Follicular lymphoma grade IIIa, intra-abdominal lymph nodes: Secondary | ICD-10-CM

## 2020-05-28 DIAGNOSIS — E785 Hyperlipidemia, unspecified: Secondary | ICD-10-CM

## 2020-05-28 DIAGNOSIS — C8291 Follicular lymphoma, unspecified, lymph nodes of head, face, and neck: Secondary | ICD-10-CM | POA: Diagnosis not present

## 2020-05-28 DIAGNOSIS — Z79899 Other long term (current) drug therapy: Secondary | ICD-10-CM | POA: Diagnosis not present

## 2020-05-28 DIAGNOSIS — Z95828 Presence of other vascular implants and grafts: Secondary | ICD-10-CM

## 2020-05-28 DIAGNOSIS — N529 Male erectile dysfunction, unspecified: Secondary | ICD-10-CM

## 2020-05-28 LAB — LACTATE DEHYDROGENASE: LDH: 176 U/L (ref 98–192)

## 2020-05-28 LAB — CBC WITH DIFFERENTIAL (CANCER CENTER ONLY)
Abs Immature Granulocytes: 0.03 10*3/uL (ref 0.00–0.07)
Basophils Absolute: 0 10*3/uL (ref 0.0–0.1)
Basophils Relative: 1 %
Eosinophils Absolute: 0.2 10*3/uL (ref 0.0–0.5)
Eosinophils Relative: 7 %
HCT: 36.7 % — ABNORMAL LOW (ref 39.0–52.0)
Hemoglobin: 12.2 g/dL — ABNORMAL LOW (ref 13.0–17.0)
Immature Granulocytes: 1 %
Lymphocytes Relative: 13 %
Lymphs Abs: 0.5 10*3/uL — ABNORMAL LOW (ref 0.7–4.0)
MCH: 29.9 pg (ref 26.0–34.0)
MCHC: 33.2 g/dL (ref 30.0–36.0)
MCV: 90 fL (ref 80.0–100.0)
Monocytes Absolute: 0.4 10*3/uL (ref 0.1–1.0)
Monocytes Relative: 11 %
Neutro Abs: 2.5 10*3/uL (ref 1.7–7.7)
Neutrophils Relative %: 67 %
Platelet Count: 157 10*3/uL (ref 150–400)
RBC: 4.08 MIL/uL — ABNORMAL LOW (ref 4.22–5.81)
RDW: 15.4 % (ref 11.5–15.5)
WBC Count: 3.7 10*3/uL — ABNORMAL LOW (ref 4.0–10.5)
nRBC: 0 % (ref 0.0–0.2)

## 2020-05-28 LAB — CMP (CANCER CENTER ONLY)
ALT: 11 U/L (ref 0–44)
AST: 12 U/L — ABNORMAL LOW (ref 15–41)
Albumin: 3.8 g/dL (ref 3.5–5.0)
Alkaline Phosphatase: 132 U/L — ABNORMAL HIGH (ref 38–126)
Anion gap: 10 (ref 5–15)
BUN: 20 mg/dL (ref 8–23)
CO2: 24 mmol/L (ref 22–32)
Calcium: 9.4 mg/dL (ref 8.9–10.3)
Chloride: 107 mmol/L (ref 98–111)
Creatinine: 0.91 mg/dL (ref 0.61–1.24)
GFR, Est AFR Am: >60 mL/min (ref >60)
GFR, Estimated: >60 mL/min (ref >60)
Glucose, Bld: 90 mg/dL (ref 70–99)
Potassium: 4.1 mmol/L (ref 3.5–5.1)
Sodium: 141 mmol/L (ref 135–145)
Total Bilirubin: 0.6 mg/dL (ref 0.3–1.2)
Total Protein: 7.2 g/dL (ref 6.5–8.1)

## 2020-05-28 LAB — URIC ACID: Uric Acid, Serum: 5.2 mg/dL (ref 3.7–8.6)

## 2020-05-28 MED ORDER — SILDENAFIL CITRATE 100 MG PO TABS: 50.0000 mg | ORAL_TABLET | Freq: Every day | ORAL | 0 refills | Status: DC | PRN

## 2020-05-28 MED ORDER — HEPARIN SOD (PORK) LOCK FLUSH 100 UNIT/ML IV SOLN
500.0000 [IU] | Freq: Once | INTRAVENOUS | Status: AC
  Administered 2020-05-28: 500 [IU]
  Filled 2020-05-28: qty 5

## 2020-05-28 MED ORDER — SODIUM CHLORIDE 0.9% FLUSH
10.0000 mL | Freq: Once | INTRAVENOUS | Status: AC
  Administered 2020-05-28: 10 mL
  Filled 2020-05-28: qty 10

## 2020-05-28 NOTE — Progress Notes (Signed)
Subjective: Chief Complaint  Patient presents with  . Consult    wants to discuss cialis or viagra    Here for concerns about erectile dysfunction.  Since last visit he has been undergoing therapy for lymphoma.   Has his second infusion tomorrow  Here for concerns about erectile dysfunction.   Got married May of this year, newlywed.   Sometimes has no issues, other times no erections.  Sometimes in early mornings can get erections but not daily.  Its more intermittent.  Less than 50% of the time can get erections on his own.   Doesn't stay as hard as it did in the past.   Otherwise feels okay.  Drinking no alcohol in past 3 months.  ROS as in subjective    Objective: BP 140/76   Pulse (!) 52   Ht 6' (1.829 m)   Wt 220 lb 9.6 oz (100.1 kg)   SpO2 98%   BMI 29.92 kg/m    Wt Readings from Last 3 Encounters:  05/28/20 220 lb 9.6 oz (100.1 kg)  05/28/20 219 lb (99.3 kg)  04/30/20 218 lb 11.2 oz (99.2 kg)   BP Readings from Last 3 Encounters:  05/28/20 140/76  05/28/20 (!) 131/94  05/01/20 (!) 144/73   Gen: wd, wn, nad otherwise not examined    Assessment: Encounter Diagnoses  Name Primary?  . Erectile dysfunction, unspecified erectile dysfunction type Yes  . Elevated blood-pressure reading without diagnosis of hypertension   . Dyslipidemia   . Follicular lymphoma grade IIIa of intra-abdominal lymph nodes ([place])      Plan We discussed his concerns.  We discussed abnormal lipids from January 2021, EKG from that same time, elevated blood pressure readings last couple visits in the chart record compared to normal readings earlier this year.  I suspect both blood pressure and lipids playing a role in his ED.  He does not want to start medication for blood pressure or cholesterol at this time.  Counseled at length on diet, exercise, trying to improve on blood pressure and cholesterol with lifestyle changes.  Plan to recheck fasting lipids and recheck on blood  pressure and cholesterol in 3 months  Erectile Dysfunction - Reviewed pathophysiology and differential diagnosis of erectile dysfunction with the patient.  Discussed treatment options.  Begin trial of Viagra.  Discussed potential risks of medications including hypotension and priapism.  Discussed proper use of medication.  Questions were answered.  Recheck with call report in 1 week  Lymphoma-managed by oncology currently, reviewed recent office note  Eyob was seen today for consult.  Diagnoses and all orders for this visit:  Erectile dysfunction, unspecified erectile dysfunction type  Elevated blood-pressure reading without diagnosis of hypertension  Dyslipidemia  Follicular lymphoma grade IIIa of intra-abdominal lymph nodes ([facility])  Other orders -     sildenafil (VIAGRA) 100 MG tablet; Take 0.5-1 tablets (50-100 mg total) by mouth daily as needed for erectile dysfunction.   F/u with call report in 1 week, otherwise f/u 38mo fasting

## 2020-05-29 ENCOUNTER — Other Ambulatory Visit: Payer: Self-pay

## 2020-05-29 ENCOUNTER — Inpatient Hospital Stay: Payer: Medicare Other

## 2020-05-29 ENCOUNTER — Other Ambulatory Visit: Payer: Self-pay | Admitting: Hematology and Oncology

## 2020-05-29 VITALS — BP 127/75 | HR 47 | Temp 98.0°F | Resp 16 | Ht 71.5 in | Wt 218.0 lb

## 2020-05-29 DIAGNOSIS — C8290 Follicular lymphoma, unspecified, unspecified site: Secondary | ICD-10-CM | POA: Diagnosis not present

## 2020-05-29 DIAGNOSIS — Z5111 Encounter for antineoplastic chemotherapy: Secondary | ICD-10-CM | POA: Diagnosis not present

## 2020-05-29 DIAGNOSIS — C8233 Follicular lymphoma grade IIIa, intra-abdominal lymph nodes: Secondary | ICD-10-CM

## 2020-05-29 DIAGNOSIS — Z79899 Other long term (current) drug therapy: Secondary | ICD-10-CM | POA: Diagnosis not present

## 2020-05-29 MED ORDER — PALONOSETRON HCL INJECTION 0.25 MG/5ML
INTRAVENOUS | Status: AC
  Filled 2020-05-29: qty 5

## 2020-05-29 MED ORDER — SODIUM CHLORIDE 0.9 % IV SOLN
Freq: Once | INTRAVENOUS | Status: AC
  Filled 2020-05-29: qty 250

## 2020-05-29 MED ORDER — SODIUM CHLORIDE 0.9 % IV SOLN
90.0000 mg/m2 | Freq: Once | INTRAVENOUS | Status: AC
  Administered 2020-05-29: 200 mg via INTRAVENOUS
  Filled 2020-05-29: qty 8

## 2020-05-29 MED ORDER — DIPHENHYDRAMINE HCL 25 MG PO CAPS
ORAL_CAPSULE | ORAL | Status: AC
  Filled 2020-05-29: qty 2

## 2020-05-29 MED ORDER — SODIUM CHLORIDE 0.9 % IV SOLN
375.0000 mg/m2 | Freq: Once | INTRAVENOUS | Status: AC
  Administered 2020-05-29: 800 mg via INTRAVENOUS
  Filled 2020-05-29: qty 50

## 2020-05-29 MED ORDER — DIPHENHYDRAMINE HCL 25 MG PO CAPS
50.0000 mg | ORAL_CAPSULE | Freq: Once | ORAL | Status: AC
  Administered 2020-05-29: 50 mg via ORAL

## 2020-05-29 MED ORDER — SODIUM CHLORIDE 0.9 % IV SOLN
10.0000 mg | Freq: Once | INTRAVENOUS | Status: AC
  Administered 2020-05-29: 10 mg via INTRAVENOUS
  Filled 2020-05-29: qty 10

## 2020-05-29 MED ORDER — PALONOSETRON HCL INJECTION 0.25 MG/5ML
0.2500 mg | Freq: Once | INTRAVENOUS | Status: AC
  Administered 2020-05-29: 0.25 mg via INTRAVENOUS

## 2020-05-29 MED ORDER — ACETAMINOPHEN 325 MG PO TABS
ORAL_TABLET | ORAL | Status: AC
  Filled 2020-05-29: qty 2

## 2020-05-29 MED ORDER — HEPARIN SOD (PORK) LOCK FLUSH 100 UNIT/ML IV SOLN
500.0000 [IU] | Freq: Once | INTRAVENOUS | Status: AC | PRN
  Administered 2020-05-29: 500 [IU]
  Filled 2020-05-29: qty 5

## 2020-05-29 MED ORDER — SODIUM CHLORIDE 0.9% FLUSH
10.0000 mL | INTRAVENOUS | Status: DC | PRN
  Administered 2020-05-29: 10 mL
  Filled 2020-05-29: qty 10

## 2020-05-29 MED ORDER — ACETAMINOPHEN 325 MG PO TABS
650.0000 mg | ORAL_TABLET | Freq: Once | ORAL | Status: AC
  Administered 2020-05-29: 650 mg via ORAL

## 2020-05-29 NOTE — Patient Instructions (Signed)
[facility] Discharge Instructions for Patients Receiving Chemotherapy  Today you received the following chemotherapy agents: Rituximab and Benedka  To help prevent nausea and vomiting after your treatment, we encourage you to take your nausea medication as prescribed.    If you develop nausea and vomiting that is not controlled by your nausea medication, call the clinic.   BELOW ARE SYMPTOMS THAT SHOULD BE REPORTED IMMEDIATELY:  *FEVER GREATER THAN 100.5 F  *CHILLS WITH OR WITHOUT FEVER  NAUSEA AND VOMITING THAT IS NOT CONTROLLED WITH YOUR NAUSEA MEDICATION  *UNUSUAL SHORTNESS OF BREATH  *UNUSUAL BRUISING OR BLEEDING  TENDERNESS IN MOUTH AND THROAT WITH OR WITHOUT PRESENCE OF ULCERS  *URINARY PROBLEMS  *BOWEL PROBLEMS  UNUSUAL RASH Items with * indicate a potential emergency and should be followed up as soon as possible.  Feel free to call the clinic should you have any questions or concerns. The clinic phone number is [phone].  Please show the CHEMO ALERT CARD at check-in to the Emergency Department and triage nurse.   

## 2020-05-30 ENCOUNTER — Inpatient Hospital Stay: Payer: Medicare Other

## 2020-05-30 ENCOUNTER — Other Ambulatory Visit: Payer: Self-pay

## 2020-05-30 VITALS — BP 149/84 | HR 49 | Temp 98.3°F | Resp 16

## 2020-05-30 DIAGNOSIS — Z5111 Encounter for antineoplastic chemotherapy: Secondary | ICD-10-CM | POA: Diagnosis not present

## 2020-05-30 DIAGNOSIS — Z79899 Other long term (current) drug therapy: Secondary | ICD-10-CM | POA: Diagnosis not present

## 2020-05-30 DIAGNOSIS — C8290 Follicular lymphoma, unspecified, unspecified site: Secondary | ICD-10-CM | POA: Diagnosis not present

## 2020-05-30 DIAGNOSIS — C8233 Follicular lymphoma grade IIIa, intra-abdominal lymph nodes: Secondary | ICD-10-CM

## 2020-05-30 MED ORDER — SODIUM CHLORIDE 0.9 % IV SOLN
Freq: Once | INTRAVENOUS | Status: AC
  Filled 2020-05-30: qty 250

## 2020-05-30 MED ORDER — SODIUM CHLORIDE 0.9 % IV SOLN
90.0000 mg/m2 | Freq: Once | INTRAVENOUS | Status: AC
  Administered 2020-05-30: 200 mg via INTRAVENOUS
  Filled 2020-05-30: qty 8

## 2020-05-30 MED ORDER — HEPARIN SOD (PORK) LOCK FLUSH 100 UNIT/ML IV SOLN
500.0000 [IU] | Freq: Once | INTRAVENOUS | Status: AC | PRN
  Administered 2020-05-30: 500 [IU]
  Filled 2020-05-30: qty 5

## 2020-05-30 MED ORDER — SODIUM CHLORIDE 0.9% FLUSH
10.0000 mL | INTRAVENOUS | Status: DC | PRN
  Administered 2020-05-30: 10 mL
  Filled 2020-05-30: qty 10

## 2020-05-30 MED ORDER — SODIUM CHLORIDE 0.9 % IV SOLN
10.0000 mg | Freq: Once | INTRAVENOUS | Status: AC
  Administered 2020-05-30: 10 mg via INTRAVENOUS
  Filled 2020-05-30: qty 10

## 2020-05-30 NOTE — Patient Instructions (Signed)
[facility] Discharge Instructions for Patients Receiving Chemotherapy  Today you received the following chemotherapy agents Bendeka.   To help prevent nausea and vomiting after your treatment, we encourage you to take your nausea medication as prescribed.    If you develop nausea and vomiting that is not controlled by your nausea medication, call the clinic.   BELOW ARE SYMPTOMS THAT SHOULD BE REPORTED IMMEDIATELY:  *FEVER GREATER THAN 100.5 F  *CHILLS WITH OR WITHOUT FEVER  NAUSEA AND VOMITING THAT IS NOT CONTROLLED WITH YOUR NAUSEA MEDICATION  *UNUSUAL SHORTNESS OF BREATH  *UNUSUAL BRUISING OR BLEEDING  TENDERNESS IN MOUTH AND THROAT WITH OR WITHOUT PRESENCE OF ULCERS  *URINARY PROBLEMS  *BOWEL PROBLEMS  UNUSUAL RASH Items with * indicate a potential emergency and should be followed up as soon as possible.  Feel free to call the clinic should you have any questions or concerns. The clinic phone number is [phone].  Please show the CHEMO ALERT CARD at check-in to the Emergency Department and triage nurse.  

## 2020-06-04 ENCOUNTER — Inpatient Hospital Stay: Payer: Medicare Other

## 2020-06-04 ENCOUNTER — Other Ambulatory Visit: Payer: Self-pay | Admitting: Hematology and Oncology

## 2020-06-04 ENCOUNTER — Other Ambulatory Visit: Payer: Self-pay

## 2020-06-04 DIAGNOSIS — Z5111 Encounter for antineoplastic chemotherapy: Secondary | ICD-10-CM | POA: Diagnosis not present

## 2020-06-04 DIAGNOSIS — C8233 Follicular lymphoma grade IIIa, intra-abdominal lymph nodes: Secondary | ICD-10-CM

## 2020-06-04 DIAGNOSIS — Z95828 Presence of other vascular implants and grafts: Secondary | ICD-10-CM

## 2020-06-04 LAB — CMP (CANCER CENTER ONLY)
ALT: 11 U/L (ref 0–44)
AST: 11 U/L — ABNORMAL LOW (ref 15–41)
Albumin: 3.7 g/dL (ref 3.5–5.0)
Alkaline Phosphatase: 103 U/L (ref 38–126)
Anion gap: 6 (ref 5–15)
BUN: 30 mg/dL — ABNORMAL HIGH (ref 8–23)
CO2: 27 mmol/L (ref 22–32)
Calcium: 9.1 mg/dL (ref 8.9–10.3)
Chloride: 108 mmol/L (ref 98–111)
Creatinine: 1.05 mg/dL (ref 0.61–1.24)
GFR, Est AFR Am: >60 mL/min (ref >60)
GFR, Estimated: >60 mL/min (ref >60)
Glucose, Bld: 89 mg/dL (ref 70–99)
Potassium: 3.6 mmol/L (ref 3.5–5.1)
Sodium: 141 mmol/L (ref 135–145)
Total Bilirubin: 0.6 mg/dL (ref 0.3–1.2)
Total Protein: 6.5 g/dL (ref 6.5–8.1)

## 2020-06-04 LAB — CBC WITH DIFFERENTIAL (CANCER CENTER ONLY)
Abs Immature Granulocytes: 0.04 10*3/uL (ref 0.00–0.07)
Basophils Absolute: 0 10*3/uL (ref 0.0–0.1)
Basophils Relative: 0 %
Eosinophils Absolute: 0.1 10*3/uL (ref 0.0–0.5)
Eosinophils Relative: 2 %
HCT: 33.8 % — ABNORMAL LOW (ref 39.0–52.0)
Hemoglobin: 11.3 g/dL — ABNORMAL LOW (ref 13.0–17.0)
Immature Granulocytes: 1 %
Lymphocytes Relative: 4 %
Lymphs Abs: 0.2 10*3/uL — ABNORMAL LOW (ref 0.7–4.0)
MCH: 30.6 pg (ref 26.0–34.0)
MCHC: 33.4 g/dL (ref 30.0–36.0)
MCV: 91.6 fL (ref 80.0–100.0)
Monocytes Absolute: 0.4 10*3/uL (ref 0.1–1.0)
Monocytes Relative: 8 %
Neutro Abs: 3.7 10*3/uL (ref 1.7–7.7)
Neutrophils Relative %: 85 %
Platelet Count: 174 10*3/uL (ref 150–400)
RBC: 3.69 MIL/uL — ABNORMAL LOW (ref 4.22–5.81)
RDW: 15.6 % — ABNORMAL HIGH (ref 11.5–15.5)
WBC Count: 4.3 10*3/uL (ref 4.0–10.5)
nRBC: 0 % (ref 0.0–0.2)

## 2020-06-04 LAB — LACTATE DEHYDROGENASE: LDH: 140 U/L (ref 98–192)

## 2020-06-04 MED ORDER — HEPARIN SOD (PORK) LOCK FLUSH 100 UNIT/ML IV SOLN
500.0000 [IU] | Freq: Once | INTRAVENOUS | Status: AC
  Administered 2020-06-04: 500 [IU]
  Filled 2020-06-04: qty 5

## 2020-06-04 MED ORDER — SODIUM CHLORIDE 0.9% FLUSH
10.0000 mL | Freq: Once | INTRAVENOUS | Status: AC
  Administered 2020-06-04: 10 mL
  Filled 2020-06-04: qty 10

## 2020-06-04 NOTE — Patient Instructions (Signed)

## 2020-06-09 ENCOUNTER — Telehealth: Payer: Self-pay | Admitting: Hematology and Oncology

## 2020-06-09 NOTE — Telephone Encounter (Signed)
Scheduled appt per 7/29 sch msg - pt is aware of new appts.

## 2020-06-10 ENCOUNTER — Other Ambulatory Visit: Payer: Self-pay | Admitting: Hematology and Oncology

## 2020-06-10 DIAGNOSIS — C8233 Follicular lymphoma grade IIIa, intra-abdominal lymph nodes: Secondary | ICD-10-CM

## 2020-06-11 ENCOUNTER — Inpatient Hospital Stay: Payer: Medicare Other

## 2020-06-11 ENCOUNTER — Other Ambulatory Visit: Payer: Self-pay

## 2020-06-11 ENCOUNTER — Inpatient Hospital Stay: Payer: Medicare Other | Attending: Hematology and Oncology

## 2020-06-11 ENCOUNTER — Telehealth: Payer: Self-pay | Admitting: *Deleted

## 2020-06-11 DIAGNOSIS — Z5111 Encounter for antineoplastic chemotherapy: Secondary | ICD-10-CM | POA: Diagnosis present

## 2020-06-11 DIAGNOSIS — C8290 Follicular lymphoma, unspecified, unspecified site: Secondary | ICD-10-CM | POA: Diagnosis present

## 2020-06-11 DIAGNOSIS — C8233 Follicular lymphoma grade IIIa, intra-abdominal lymph nodes: Secondary | ICD-10-CM

## 2020-06-11 DIAGNOSIS — Z95828 Presence of other vascular implants and grafts: Secondary | ICD-10-CM

## 2020-06-11 DIAGNOSIS — Z79899 Other long term (current) drug therapy: Secondary | ICD-10-CM | POA: Insufficient documentation

## 2020-06-11 LAB — CMP (CANCER CENTER ONLY)
ALT: 14 U/L (ref 0–44)
AST: 13 U/L — ABNORMAL LOW (ref 15–41)
Albumin: 3.6 g/dL (ref 3.5–5.0)
Alkaline Phosphatase: 110 U/L (ref 38–126)
Anion gap: 7 (ref 5–15)
BUN: 21 mg/dL (ref 8–23)
CO2: 26 mmol/L (ref 22–32)
Calcium: 9.6 mg/dL (ref 8.9–10.3)
Chloride: 105 mmol/L (ref 98–111)
Creatinine: 1.09 mg/dL (ref 0.61–1.24)
GFR, Est AFR Am: >60 mL/min (ref >60)
GFR, Estimated: >60 mL/min (ref >60)
Glucose, Bld: 98 mg/dL (ref 70–99)
Potassium: 4 mmol/L (ref 3.5–5.1)
Sodium: 138 mmol/L (ref 135–145)
Total Bilirubin: 0.5 mg/dL (ref 0.3–1.2)
Total Protein: 6.8 g/dL (ref 6.5–8.1)

## 2020-06-11 LAB — CBC WITH DIFFERENTIAL (CANCER CENTER ONLY)
Abs Immature Granulocytes: 0.01 10*3/uL (ref 0.00–0.07)
Basophils Absolute: 0 10*3/uL (ref 0.0–0.1)
Basophils Relative: 0 %
Eosinophils Absolute: 0.1 10*3/uL (ref 0.0–0.5)
Eosinophils Relative: 2 %
HCT: 35.6 % — ABNORMAL LOW (ref 39.0–52.0)
Hemoglobin: 11.9 g/dL — ABNORMAL LOW (ref 13.0–17.0)
Immature Granulocytes: 0 %
Lymphocytes Relative: 3 %
Lymphs Abs: 0.1 10*3/uL — ABNORMAL LOW (ref 0.7–4.0)
MCH: 30.3 pg (ref 26.0–34.0)
MCHC: 33.4 g/dL (ref 30.0–36.0)
MCV: 90.6 fL (ref 80.0–100.0)
Monocytes Absolute: 0.4 10*3/uL (ref 0.1–1.0)
Monocytes Relative: 9 %
Neutro Abs: 4 10*3/uL (ref 1.7–7.7)
Neutrophils Relative %: 86 %
Platelet Count: 185 10*3/uL (ref 150–400)
RBC: 3.93 MIL/uL — ABNORMAL LOW (ref 4.22–5.81)
RDW: 15.3 % (ref 11.5–15.5)
WBC Count: 4.6 10*3/uL (ref 4.0–10.5)
nRBC: 0 % (ref 0.0–0.2)

## 2020-06-11 LAB — LACTATE DEHYDROGENASE: LDH: 152 U/L (ref 98–192)

## 2020-06-11 MED ORDER — SODIUM CHLORIDE 0.9% FLUSH
10.0000 mL | Freq: Once | INTRAVENOUS | Status: AC
  Administered 2020-06-11: 10 mL
  Filled 2020-06-11: qty 10

## 2020-06-11 MED ORDER — HEPARIN SOD (PORK) LOCK FLUSH 100 UNIT/ML IV SOLN
500.0000 [IU] | Freq: Once | INTRAVENOUS | Status: AC
  Administered 2020-06-11: 500 [IU]
  Filled 2020-06-11: qty 5

## 2020-06-11 NOTE — Telephone Encounter (Signed)
Received message from front desk to call patient. Call made to patient -he is requesting to change his appts from 06/20/20 to 06/2120. Scheduling message sent.

## 2020-06-18 ENCOUNTER — Other Ambulatory Visit: Payer: Self-pay | Admitting: Hematology and Oncology

## 2020-06-18 ENCOUNTER — Other Ambulatory Visit: Payer: Medicare Other

## 2020-06-18 DIAGNOSIS — C8233 Follicular lymphoma grade IIIa, intra-abdominal lymph nodes: Secondary | ICD-10-CM

## 2020-06-18 NOTE — Progress Notes (Signed)
[place] [phone]   [phone]  PROGRESS NOTE  Patient Care Team: Tysinger, Leward Quan as PCP - General (Family Medicine)  Hematological/Oncological History # Follicular Lymphoma. Stage III 1) 01/07/2020: CT Neck W contrast showed cervical lymphadenopathy bilaterally, left greater than right. Numerous enlarged lymph nodes are present 2)  01/22/2020: [place] Core biopsy performed which revealed overall features consistent with involvement by non-Hodgkin's B-cell lymphoma and the morphologic and phenotypic findings favor follicular lymphoma.  3) 01/31/2020: establish care with Dr. Lorenso Courier  4) 02/12/2020: PET CT scan demonstrates bulky adenopathy in the neck, chest, abdomen and pelvis and diffuse skeletal uptake is suspicious for but not diagnostic of marrow involvement. 5) 04/30/2020: Cycle 1 Day 1 of Bendamustine/Rituximab 6) 05/29/2020: Cycle 2 Day 1 of Bendamustine/Rituximab 7) 06/25/2020: Cycle 3 Day 1 of Bendamustine/Rituximab  Interval History:  Jorge Gould 64 y.o. male with medical history significant for Stage III follicular lymphoma who presents for a follow up visit. The patient's last visit was on 05/28/2020 at which time he started Cycle 2 Day 1 of Bendamustine/Rituximab. In the interim since the last visit Jorge Gould has completed Cycle 2 of therapy with stable counts and no ED visits/hospitailzations.   On exam today Jorge Gould reports that he has been at his baseline level of health.  He has been able to work without any interruption while receiving chemotherapy.  He reports he has not had any nausea, vomiting, or diarrhea while on treatment.  Reports that he has not had any fevers, chills, sweats and he has his baseline level of energy.  He reports that his bowel movements are regular and he is not required any of his as needed medications.  Overall he has no questions concerns complaints regarding treatment today.  A full 10 point ROS is listed  below.  MEDICAL HISTORY:  Past Medical History:  Diagnosis Date  . Obesity     SURGICAL HISTORY: Past Surgical History:  Procedure Laterality Date  . ANKLE FRACTURE SURGERY  2016   right, trauma repair after fall down stairs  . HERNIA REPAIR     umbilical  . IR IMAGING GUIDED PORT INSERTION  04/09/2020  . TONSILLECTOMY      SOCIAL HISTORY: Social History   Socioeconomic History  . Marital status: Married    Spouse name: Not on file  . Number of children: Not on file  . Years of education: Not on file  . Highest education level: Not on file  Occupational History  . Not on file  Tobacco Use  . Smoking status: Never Smoker  . Smokeless tobacco: Never Used  Vaping Use  . Vaping Use: Never used  Substance and Sexual Activity  . Alcohol use: Yes    Alcohol/week: 3.0 standard drinks    Types: 3 Shots of liquor per week  . Drug use: Not Currently  . Sexual activity: Not on file  Other Topics Concern  . Not on file  Social History Narrative   Engaged, plan to marry in March 2021, been together since 2014.  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   Most exercise on the job, working 7 days per week.   No children, but fiance has 5 children.    11/2019   Social Determinants of Health   Financial Resource Strain:   . Difficulty of Paying Living Expenses:   Food Insecurity:   . Worried About Charity fundraiser in the Last Year:   .  Ran Out of Food in the Last Year:   Transportation Needs:   . Film/video editor (Medical):   Marland Kitchen Lack of Transportation (Non-Medical):   Physical Activity:   . Days of Exercise per Week:   . Minutes of Exercise per Session:   Stress:   . Feeling of Stress :   Social Connections:   . Frequency of Communication with Friends and Family:   . Frequency of Social Gatherings with Friends and Family:   . Attends Religious Services:   . Active Member of Clubs or Organizations:   . Attends Archivist Meetings:   Marland Kitchen  Marital Status:   Intimate Partner Violence:   . Fear of Current or Ex-Partner:   . Emotionally Abused:   Marland Kitchen Physically Abused:   . Sexually Abused:     FAMILY HISTORY: Family History  Problem Relation Age of Onset  . Cancer Mother        lung; former smoker  . COPD Father        emphysema  . Diabetes Maternal Aunt   . Heart disease Maternal Uncle   . Heart disease Maternal Uncle   . Diabetes Maternal Aunt   . Stroke Neg Hx   . Hyperlipidemia Neg Hx   . Hypertension Neg Hx     ALLERGIES:  has No Known Allergies.  MEDICATIONS:  Current Outpatient Medications  Medication Sig Dispense Refill  . acyclovir (ZOVIRAX) 400 MG tablet Take 1 tablet (400 mg total) by mouth daily. 30 tablet 3  . allopurinol (ZYLOPRIM) 300 MG tablet Take 1 tablet (300 mg total) by mouth daily. 30 tablet 3  . dexamethasone (DECADRON) 4 MG tablet Take 2 tablets (8 mg total) by mouth daily. Start the day after bendamustine chemotherapy for 2 days. Take with food. (Patient not taking: Reported on 05/28/2020) 30 tablet 1  . lidocaine-prilocaine (EMLA) cream Apply to affected area once (Patient not taking: Reported on 05/28/2020) 30 g 3  . Naphazoline HCl (CLEAR EYES OP) Place 1 drop into both eyes daily as needed (redness). (Patient not taking: Reported on 05/28/2020)    . ondansetron (ZOFRAN) 8 MG tablet Take 1 tablet (8 mg total) by mouth 2 (two) times daily as needed for refractory nausea / vomiting. Start on day 2 after bendamustine chemo. (Patient not taking: Reported on 05/28/2020) 30 tablet 1  . prochlorperazine (COMPAZINE) 10 MG tablet Take 1 tablet (10 mg total) by mouth every 6 (six) hours as needed (Nausea or vomiting). (Patient not taking: Reported on 05/28/2020) 30 tablet 1  . sildenafil (VIAGRA) 100 MG tablet Take 0.5-1 tablets (50-100 mg total) by mouth daily as needed for erectile dysfunction. 10 tablet 0   No current facility-administered medications for this visit.    REVIEW OF SYSTEMS:     Constitutional: ( - ) fevers, ( - )  chills , ( - ) night sweats Eyes: ( - ) blurriness of vision, ( - ) double vision, ( - ) watery eyes Ears, nose, mouth, throat, and face: ( - ) mucositis, ( - ) sore throat Respiratory: ( - ) cough, ( - ) dyspnea, ( - ) wheezes Cardiovascular: ( - ) palpitation, ( - ) chest discomfort, ( - ) lower extremity swelling Gastrointestinal:  ( - ) nausea, ( - ) heartburn, ( - ) change in bowel habits Skin: ( - ) abnormal skin rashes Lymphatics: ( - ) new lymphadenopathy, ( - ) easy bruising Neurological: ( - ) numbness, ( - ) tingling, ( - )  new weaknesses Behavioral/Psych: ( - ) mood change, ( - ) new changes  All other systems were reviewed with the patient and are negative.  PHYSICAL EXAMINATION: ECOG PERFORMANCE STATUS: 1 - Symptomatic but completely ambulatory  Vitals:   06/19/20 1559  BP: (!) 156/90  Pulse: (!) 57  Resp: 16  Temp: (!) 96.4 F (35.8 C)  SpO2: 100%   Filed Weights   06/19/20 1559  Weight: 221 lb 1 oz (100.3 kg)    GENERAL: well appearing middle aged Caucasian male in NAD  SKIN: skin color, texture, turgor are normal, no rashes or significant lesions EYES: conjunctiva are pink and non-injected, sclera clear NECK: supple, non-tender LYMPH:  no palpable lymphadenopathy in the cervical chain LUNGS: clear to auscultation and percussion with normal breathing effort HEART: regular rate & rhythm and no murmurs and no lower extremity edema Musculoskeletal: no cyanosis of digits and no clubbing  PSYCH: alert & oriented x 3, fluent speech NEURO: no focal motor/sensory deficits  LABORATORY DATA:  I have reviewed the data as listed CBC Latest Ref Rng & Units 06/19/2020 06/11/2020 06/04/2020  WBC 4.0 - 10.5 K/uL 4.4 4.6 4.3  Hemoglobin 13.0 - 17.0 g/dL 11.5(L) 11.9(L) 11.3(L)  Hematocrit 39 - 52 % 34.3(L) 35.6(L) 33.8(L)  Platelets 150 - 400 K/uL 196 185 174    CMP Latest Ref Rng & Units 06/19/2020 06/11/2020 06/04/2020  Glucose 70 - 99  mg/dL 82 98 89  BUN 8 - 23 mg/dL 26(H) 21 30(H)  Creatinine 0.61 - 1.24 mg/dL 1.05 1.09 1.05  Sodium 135 - 145 mmol/L 139 138 141  Potassium 3.5 - 5.1 mmol/L 4.0 4.0 3.6  Chloride 98 - 111 mmol/L 106 105 108  CO2 22 - 32 mmol/L 23 26 27   Calcium 8.9 - 10.3 mg/dL 9.9 9.6 9.1  Total Protein 6.5 - 8.1 g/dL 7.2 6.8 6.5  Total Bilirubin 0.3 - 1.2 mg/dL 0.5 0.5 0.6  Alkaline Phos 38 - 126 U/L 104 110 103  AST 15 - 41 U/L 11(L) 13(L) 11(L)  ALT 0 - 44 U/L 9 14 11    RADIOGRAPHIC STUDIES: I have personally reviewed the radiological images as listed and agreed with the findings in the report: lymphadenopathy in the neck, chest, and abdomen, with most pronounced nodes in the abdomen. Bone also has more FDG avidity than would be expected.   No results found.  ASSESSMENT & PLAN ILYAS LIPSITZ 65 y.o. male with medical history significant for newly diagnosed follicular lymphoma who presents for a follow up visit. Today is his visit prior to Cycle 3 Day 1 of Bendamustine/Rituximab therapy planned for next week (06/25/2020-06/26/2020).  On exam today Jorge Gould is at his baseline level of health.  He has had no symptoms throughout the course of his treatment thus far.  He has no questions, concerns or complaints today.  He is willing and able to proceed with treatment at this time.  FLIPI-2 Score: 4  (high risk). Indication for treatment: bulky lymphadenopathy with 15 x 9 cm conglomerate of lymph nodes in the abdomen.  The regimen of choice for this patient is Bendamustine/Rituximab. Treatment will consist of 6 cycles, each lasting 28 days. The patient will receive Bendamustine 90mg /m2 IV on Day 1 and Day 2 in addition to Rituximab 375mg /m2 IV on Day 1. This is being administered with curative intent. Administered x 6 cycles.   # Follicular Lymphoma. Stage III --next week is Cycle 3 Day 1 of R-Benda therapy. This is being administered  with curative intent. OK to proceed based on clinic exam and labs.    --plan for repeat imaging 3 months after start of therapy to assure response to therapy.  --supportive treatments as noted below.  --RTC in 4 weeks for Cycle 4 Day 1. Given stability of labs no need for q 2 week lab checks.   #Fertility Preservation  --referred patient to [facility] for Fertility Clinic and Sperm preservation.  --unfortunately patient was noted to be sterile on exam, no sperm to freeze.   #Supportive Care --provided patient with zofran 8mg  PO q8H PRN and compazine 10mg  PO q6H PRN for antiemetic therapy. He has not required this.  --acyclovir 300mg  PO daily for VZV prophyalxis --continue allopurinol 300mg  PO daily as TLS prophylaxis.  --do not anticipate need for GCSF or transfusion support  No orders of the defined types were placed in this encounter.  All questions were answered. The patient knows to call the clinic with any problems, questions or concerns.  A total of more than 30 minutes were spent on this encounter and over half of that time was spent on counseling and coordination of care as outlined above.   Ledell Peoples, MD Department of Hematology/Oncology [place] [facility] Phone: [phone] Pager: [phone] Email: [email or web address]  06/19/2020 5:28 PM

## 2020-06-19 ENCOUNTER — Inpatient Hospital Stay: Payer: Medicare Other

## 2020-06-19 ENCOUNTER — Encounter: Payer: Self-pay | Admitting: Hematology and Oncology

## 2020-06-19 ENCOUNTER — Inpatient Hospital Stay: Payer: Medicare Other | Admitting: Hematology and Oncology

## 2020-06-19 ENCOUNTER — Other Ambulatory Visit: Payer: Self-pay

## 2020-06-19 VITALS — BP 156/90 | HR 57 | Temp 96.4°F | Resp 16 | Wt 221.1 lb

## 2020-06-19 DIAGNOSIS — Z95828 Presence of other vascular implants and grafts: Secondary | ICD-10-CM

## 2020-06-19 DIAGNOSIS — C8291 Follicular lymphoma, unspecified, lymph nodes of head, face, and neck: Secondary | ICD-10-CM

## 2020-06-19 DIAGNOSIS — C8233 Follicular lymphoma grade IIIa, intra-abdominal lymph nodes: Secondary | ICD-10-CM | POA: Diagnosis not present

## 2020-06-19 DIAGNOSIS — C8290 Follicular lymphoma, unspecified, unspecified site: Secondary | ICD-10-CM | POA: Diagnosis not present

## 2020-06-19 DIAGNOSIS — Z5111 Encounter for antineoplastic chemotherapy: Secondary | ICD-10-CM | POA: Diagnosis not present

## 2020-06-19 DIAGNOSIS — Z79899 Other long term (current) drug therapy: Secondary | ICD-10-CM | POA: Diagnosis not present

## 2020-06-19 LAB — CBC WITH DIFFERENTIAL (CANCER CENTER ONLY)
Abs Immature Granulocytes: 0.01 10*3/uL (ref 0.00–0.07)
Basophils Absolute: 0 10*3/uL (ref 0.0–0.1)
Basophils Relative: 0 %
Eosinophils Absolute: 0.1 10*3/uL (ref 0.0–0.5)
Eosinophils Relative: 2 %
HCT: 34.3 % — ABNORMAL LOW (ref 39.0–52.0)
Hemoglobin: 11.5 g/dL — ABNORMAL LOW (ref 13.0–17.0)
Immature Granulocytes: 0 %
Lymphocytes Relative: 5 %
Lymphs Abs: 0.2 10*3/uL — ABNORMAL LOW (ref 0.7–4.0)
MCH: 30.3 pg (ref 26.0–34.0)
MCHC: 33.5 g/dL (ref 30.0–36.0)
MCV: 90.5 fL (ref 80.0–100.0)
Monocytes Absolute: 0.7 10*3/uL (ref 0.1–1.0)
Monocytes Relative: 15 %
Neutro Abs: 3.4 10*3/uL (ref 1.7–7.7)
Neutrophils Relative %: 78 %
Platelet Count: 196 10*3/uL (ref 150–400)
RBC: 3.79 MIL/uL — ABNORMAL LOW (ref 4.22–5.81)
RDW: 14.9 % (ref 11.5–15.5)
WBC Count: 4.4 10*3/uL (ref 4.0–10.5)
nRBC: 0 % (ref 0.0–0.2)

## 2020-06-19 LAB — CMP (CANCER CENTER ONLY)
ALT: 9 U/L (ref 0–44)
AST: 11 U/L — ABNORMAL LOW (ref 15–41)
Albumin: 3.9 g/dL (ref 3.5–5.0)
Alkaline Phosphatase: 104 U/L (ref 38–126)
Anion gap: 10 (ref 5–15)
BUN: 26 mg/dL — ABNORMAL HIGH (ref 8–23)
CO2: 23 mmol/L (ref 22–32)
Calcium: 9.9 mg/dL (ref 8.9–10.3)
Chloride: 106 mmol/L (ref 98–111)
Creatinine: 1.05 mg/dL (ref 0.61–1.24)
GFR, Est AFR Am: >60 mL/min (ref >60)
GFR, Estimated: >60 mL/min (ref >60)
Glucose, Bld: 82 mg/dL (ref 70–99)
Potassium: 4 mmol/L (ref 3.5–5.1)
Sodium: 139 mmol/L (ref 135–145)
Total Bilirubin: 0.5 mg/dL (ref 0.3–1.2)
Total Protein: 7.2 g/dL (ref 6.5–8.1)

## 2020-06-19 LAB — LACTATE DEHYDROGENASE: LDH: 207 U/L — ABNORMAL HIGH (ref 98–192)

## 2020-06-19 MED ORDER — HEPARIN SOD (PORK) LOCK FLUSH 100 UNIT/ML IV SOLN
500.0000 [IU] | Freq: Once | INTRAVENOUS | Status: AC
  Administered 2020-06-19: 500 [IU]
  Filled 2020-06-19: qty 5

## 2020-06-19 MED ORDER — SODIUM CHLORIDE 0.9% FLUSH
10.0000 mL | Freq: Once | INTRAVENOUS | Status: AC
  Administered 2020-06-19: 10 mL
  Filled 2020-06-19: qty 10

## 2020-06-20 ENCOUNTER — Other Ambulatory Visit: Payer: Medicare Other

## 2020-06-20 ENCOUNTER — Ambulatory Visit: Payer: Medicare Other | Admitting: Hematology and Oncology

## 2020-06-23 ENCOUNTER — Other Ambulatory Visit: Payer: Self-pay | Admitting: *Deleted

## 2020-06-23 DIAGNOSIS — C8233 Follicular lymphoma grade IIIa, intra-abdominal lymph nodes: Secondary | ICD-10-CM

## 2020-06-25 ENCOUNTER — Inpatient Hospital Stay: Payer: Medicare Other

## 2020-06-25 ENCOUNTER — Other Ambulatory Visit: Payer: Self-pay

## 2020-06-25 VITALS — BP 124/82 | HR 61 | Temp 98.0°F | Resp 18

## 2020-06-25 DIAGNOSIS — C8233 Follicular lymphoma grade IIIa, intra-abdominal lymph nodes: Secondary | ICD-10-CM

## 2020-06-25 DIAGNOSIS — Z5111 Encounter for antineoplastic chemotherapy: Secondary | ICD-10-CM | POA: Diagnosis not present

## 2020-06-25 DIAGNOSIS — Z95828 Presence of other vascular implants and grafts: Secondary | ICD-10-CM

## 2020-06-25 DIAGNOSIS — Z79899 Other long term (current) drug therapy: Secondary | ICD-10-CM | POA: Diagnosis not present

## 2020-06-25 DIAGNOSIS — C8290 Follicular lymphoma, unspecified, unspecified site: Secondary | ICD-10-CM | POA: Diagnosis not present

## 2020-06-25 LAB — CBC WITH DIFFERENTIAL (CANCER CENTER ONLY)
Abs Immature Granulocytes: 0.02 10*3/uL (ref 0.00–0.07)
Basophils Absolute: 0 10*3/uL (ref 0.0–0.1)
Basophils Relative: 1 %
Eosinophils Absolute: 0.1 10*3/uL (ref 0.0–0.5)
Eosinophils Relative: 3 %
HCT: 35.9 % — ABNORMAL LOW (ref 39.0–52.0)
Hemoglobin: 12.2 g/dL — ABNORMAL LOW (ref 13.0–17.0)
Immature Granulocytes: 1 %
Lymphocytes Relative: 11 %
Lymphs Abs: 0.3 10*3/uL — ABNORMAL LOW (ref 0.7–4.0)
MCH: 30.7 pg (ref 26.0–34.0)
MCHC: 34 g/dL (ref 30.0–36.0)
MCV: 90.4 fL (ref 80.0–100.0)
Monocytes Absolute: 0.3 10*3/uL (ref 0.1–1.0)
Monocytes Relative: 11 %
Neutro Abs: 2.2 10*3/uL (ref 1.7–7.7)
Neutrophils Relative %: 73 %
Platelet Count: 212 10*3/uL (ref 150–400)
RBC: 3.97 MIL/uL — ABNORMAL LOW (ref 4.22–5.81)
RDW: 14.6 % (ref 11.5–15.5)
WBC Count: 2.9 10*3/uL — ABNORMAL LOW (ref 4.0–10.5)
nRBC: 0 % (ref 0.0–0.2)

## 2020-06-25 LAB — CMP (CANCER CENTER ONLY)
ALT: 10 U/L (ref 0–44)
AST: 14 U/L — ABNORMAL LOW (ref 15–41)
Albumin: 3.8 g/dL (ref 3.5–5.0)
Alkaline Phosphatase: 115 U/L (ref 38–126)
Anion gap: 9 (ref 5–15)
BUN: 18 mg/dL (ref 8–23)
CO2: 25 mmol/L (ref 22–32)
Calcium: 9.5 mg/dL (ref 8.9–10.3)
Chloride: 104 mmol/L (ref 98–111)
Creatinine: 0.91 mg/dL (ref 0.61–1.24)
GFR, Est AFR Am: >60 mL/min (ref >60)
GFR, Estimated: >60 mL/min (ref >60)
Glucose, Bld: 89 mg/dL (ref 70–99)
Potassium: 4 mmol/L (ref 3.5–5.1)
Sodium: 138 mmol/L (ref 135–145)
Total Bilirubin: 0.4 mg/dL (ref 0.3–1.2)
Total Protein: 7 g/dL (ref 6.5–8.1)

## 2020-06-25 LAB — LACTATE DEHYDROGENASE: LDH: 166 U/L (ref 98–192)

## 2020-06-25 MED ORDER — DIPHENHYDRAMINE HCL 25 MG PO CAPS
ORAL_CAPSULE | ORAL | Status: AC
  Filled 2020-06-25: qty 2

## 2020-06-25 MED ORDER — SODIUM CHLORIDE 0.9 % IV SOLN
Freq: Once | INTRAVENOUS | Status: AC
  Filled 2020-06-25: qty 250

## 2020-06-25 MED ORDER — SODIUM CHLORIDE 0.9% FLUSH
10.0000 mL | Freq: Once | INTRAVENOUS | Status: AC
  Administered 2020-06-25: 10 mL
  Filled 2020-06-25: qty 10

## 2020-06-25 MED ORDER — PALONOSETRON HCL INJECTION 0.25 MG/5ML
INTRAVENOUS | Status: AC
  Filled 2020-06-25: qty 5

## 2020-06-25 MED ORDER — HEPARIN SOD (PORK) LOCK FLUSH 100 UNIT/ML IV SOLN
500.0000 [IU] | Freq: Once | INTRAVENOUS | Status: AC | PRN
  Administered 2020-06-25: 500 [IU]
  Filled 2020-06-25: qty 5

## 2020-06-25 MED ORDER — ACETAMINOPHEN 325 MG PO TABS
650.0000 mg | ORAL_TABLET | Freq: Once | ORAL | Status: AC
  Administered 2020-06-25: 650 mg via ORAL

## 2020-06-25 MED ORDER — SODIUM CHLORIDE 0.9 % IV SOLN
10.0000 mg | Freq: Once | INTRAVENOUS | Status: AC
  Administered 2020-06-25: 10 mg via INTRAVENOUS
  Filled 2020-06-25: qty 10

## 2020-06-25 MED ORDER — SODIUM CHLORIDE 0.9 % IV SOLN
375.0000 mg/m2 | Freq: Once | INTRAVENOUS | Status: AC
  Administered 2020-06-25: 800 mg via INTRAVENOUS
  Filled 2020-06-25: qty 50

## 2020-06-25 MED ORDER — SODIUM CHLORIDE 0.9% FLUSH
10.0000 mL | INTRAVENOUS | Status: DC | PRN
  Administered 2020-06-25: 10 mL
  Filled 2020-06-25: qty 10

## 2020-06-25 MED ORDER — PALONOSETRON HCL INJECTION 0.25 MG/5ML
0.2500 mg | Freq: Once | INTRAVENOUS | Status: AC
  Administered 2020-06-25: 0.25 mg via INTRAVENOUS

## 2020-06-25 MED ORDER — ACETAMINOPHEN 325 MG PO TABS
ORAL_TABLET | ORAL | Status: AC
  Filled 2020-06-25: qty 2

## 2020-06-25 MED ORDER — DIPHENHYDRAMINE HCL 25 MG PO CAPS
50.0000 mg | ORAL_CAPSULE | Freq: Once | ORAL | Status: AC
  Administered 2020-06-25: 50 mg via ORAL

## 2020-06-25 MED ORDER — SODIUM CHLORIDE 0.9 % IV SOLN
90.0000 mg/m2 | Freq: Once | INTRAVENOUS | Status: AC
  Administered 2020-06-25: 200 mg via INTRAVENOUS
  Filled 2020-06-25: qty 8

## 2020-06-25 NOTE — Patient Instructions (Signed)

## 2020-06-25 NOTE — Patient Instructions (Signed)
[facility] Discharge Instructions for Patients Receiving Chemotherapy  Today you received the following chemotherapy agents: Rituximab and Benedka  To help prevent nausea and vomiting after your treatment, we encourage you to take your nausea medication as prescribed.    If you develop nausea and vomiting that is not controlled by your nausea medication, call the clinic.   BELOW ARE SYMPTOMS THAT SHOULD BE REPORTED IMMEDIATELY:  *FEVER GREATER THAN 100.5 F  *CHILLS WITH OR WITHOUT FEVER  NAUSEA AND VOMITING THAT IS NOT CONTROLLED WITH YOUR NAUSEA MEDICATION  *UNUSUAL SHORTNESS OF BREATH  *UNUSUAL BRUISING OR BLEEDING  TENDERNESS IN MOUTH AND THROAT WITH OR WITHOUT PRESENCE OF ULCERS  *URINARY PROBLEMS  *BOWEL PROBLEMS  UNUSUAL RASH Items with * indicate a potential emergency and should be followed up as soon as possible.  Feel free to call the clinic should you have any questions or concerns. The clinic phone number is [phone].  Please show the CHEMO ALERT CARD at check-in to the Emergency Department and triage nurse.   

## 2020-06-26 ENCOUNTER — Other Ambulatory Visit: Payer: Self-pay

## 2020-06-26 ENCOUNTER — Inpatient Hospital Stay: Payer: Medicare Other

## 2020-06-26 VITALS — BP 133/69 | HR 57 | Temp 98.0°F | Resp 16

## 2020-06-26 DIAGNOSIS — C8233 Follicular lymphoma grade IIIa, intra-abdominal lymph nodes: Secondary | ICD-10-CM

## 2020-06-26 DIAGNOSIS — Z5111 Encounter for antineoplastic chemotherapy: Secondary | ICD-10-CM | POA: Diagnosis not present

## 2020-06-26 DIAGNOSIS — Z79899 Other long term (current) drug therapy: Secondary | ICD-10-CM | POA: Diagnosis not present

## 2020-06-26 DIAGNOSIS — C8290 Follicular lymphoma, unspecified, unspecified site: Secondary | ICD-10-CM | POA: Diagnosis not present

## 2020-06-26 MED ORDER — SODIUM CHLORIDE 0.9 % IV SOLN
Freq: Once | INTRAVENOUS | Status: AC
  Filled 2020-06-26: qty 250

## 2020-06-26 MED ORDER — HEPARIN SOD (PORK) LOCK FLUSH 100 UNIT/ML IV SOLN
500.0000 [IU] | Freq: Once | INTRAVENOUS | Status: AC | PRN
  Administered 2020-06-26: 500 [IU]
  Filled 2020-06-26: qty 5

## 2020-06-26 MED ORDER — SODIUM CHLORIDE 0.9 % IV SOLN
10.0000 mg | Freq: Once | INTRAVENOUS | Status: AC
  Administered 2020-06-26: 10 mg via INTRAVENOUS
  Filled 2020-06-26: qty 10

## 2020-06-26 MED ORDER — SODIUM CHLORIDE 0.9 % IV SOLN
90.0000 mg/m2 | Freq: Once | INTRAVENOUS | Status: AC
  Administered 2020-06-26: 200 mg via INTRAVENOUS
  Filled 2020-06-26: qty 8

## 2020-06-26 MED ORDER — SODIUM CHLORIDE 0.9% FLUSH
10.0000 mL | INTRAVENOUS | Status: DC | PRN
  Administered 2020-06-26: 10 mL
  Filled 2020-06-26: qty 10

## 2020-06-26 NOTE — Patient Instructions (Signed)
[facility] Discharge Instructions for Patients Receiving Chemotherapy  Today you received the following chemotherapy agents Bendeka.   To help prevent nausea and vomiting after your treatment, we encourage you to take your nausea medication as prescribed.    If you develop nausea and vomiting that is not controlled by your nausea medication, call the clinic.   BELOW ARE SYMPTOMS THAT SHOULD BE REPORTED IMMEDIATELY:  *FEVER GREATER THAN 100.5 F  *CHILLS WITH OR WITHOUT FEVER  NAUSEA AND VOMITING THAT IS NOT CONTROLLED WITH YOUR NAUSEA MEDICATION  *UNUSUAL SHORTNESS OF BREATH  *UNUSUAL BRUISING OR BLEEDING  TENDERNESS IN MOUTH AND THROAT WITH OR WITHOUT PRESENCE OF ULCERS  *URINARY PROBLEMS  *BOWEL PROBLEMS  UNUSUAL RASH Items with * indicate a potential emergency and should be followed up as soon as possible.  Feel free to call the clinic should you have any questions or concerns. The clinic phone number is [phone].  Please show the CHEMO ALERT CARD at check-in to the Emergency Department and triage nurse.  

## 2020-07-22 ENCOUNTER — Other Ambulatory Visit: Payer: Self-pay | Admitting: Hematology and Oncology

## 2020-07-22 DIAGNOSIS — C8233 Follicular lymphoma grade IIIa, intra-abdominal lymph nodes: Secondary | ICD-10-CM

## 2020-07-22 NOTE — Progress Notes (Signed)
[facility] [phone]   [phone]  PROGRESS NOTE  Patient Care Team: Tysinger, Leward Quan as PCP - General (Family Medicine)  Hematological/Oncological History # Follicular Lymphoma. Stage III 1) 01/07/2020: CT Neck W contrast showed cervical lymphadenopathy bilaterally, left greater than right. Numerous enlarged lymph nodes are present 2)  01/22/2020: [place] Core biopsy performed which revealed overall features consistent with involvement by non-Hodgkin's B-cell lymphoma and the morphologic and phenotypic findings favor follicular lymphoma.  3) 01/31/2020: establish care with Dr. Lorenso Courier  4) 02/12/2020: PET CT scan demonstrates bulky adenopathy in the neck, chest, abdomen and pelvis and diffuse skeletal uptake is suspicious for (but not diagnostic) of marrow involvement. 5) 04/30/2020: Cycle 1 Day 1 of Bendamustine/Rituximab 6) 05/29/2020: Cycle 2 Day 1 of Bendamustine/Rituximab 7) 06/25/2020: Cycle 3 Day 1 of Bendamustine/Rituximab 8) 07/23/2020: Cycle 4 Day 1 of Bendamustine/Rituximab  Interval History:  Jorge Gould 64 y.o. male with medical history significant for Stage III follicular lymphoma who presents for a follow up visit. The patient's last visit was on 06/25/2020 at which time he started Cycle 3 Day 1 of Bendamustine/Rituximab. In the interim since the last visit Jorge Gould has completed Cycle 3 of therapy with stable counts and no ED visits/hospitailzations.   On exam today [name] reports he has overall done very well with cycle 3 of rituximab bendamustine.  He notes that he had 1 day of dizziness and nausea after cycle 3 but he took 1 nausea pill and the symptoms resolved.  He notes that he has not had any issues with loose stools, fevers, chills, sweats.  He also notes that he has energy, but "not enough hours in the day".  In order to do his extensive amount of work.  His weight has been stable and his appetite has been good.  He has no questions  concerns or complaints today.  A full 10 point ROS is listed below.  MEDICAL HISTORY:  Past Medical History:  Diagnosis Date  . Obesity     SURGICAL HISTORY: Past Surgical History:  Procedure Laterality Date  . ANKLE FRACTURE SURGERY  2016   right, trauma repair after fall down stairs  . HERNIA REPAIR     umbilical  . IR IMAGING GUIDED PORT INSERTION  04/09/2020  . TONSILLECTOMY      SOCIAL HISTORY: Social History   Socioeconomic History  . Marital status: Married    Spouse name: Not on file  . Number of children: Not on file  . Years of education: Not on file  . Highest education level: Not on file  Occupational History  . Not on file  Tobacco Use  . Smoking status: Never Smoker  . Smokeless tobacco: Never Used  Vaping Use  . Vaping Use: Never used  Substance and Sexual Activity  . Alcohol use: Yes    Alcohol/week: 3.0 standard drinks    Types: 3 Shots of liquor per week  . Drug use: Not Currently  . Sexual activity: Not on file  Other Topics Concern  . Not on file  Social History Narrative   Engaged, plan to marry in March 2021, been together since 2014.  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   Most exercise on the job, working 7 days per week.   No children, but fiance has 5 children.    11/2019   Social Determinants of Health   Financial Resource Strain:   . Difficulty of Paying Living Expenses:  Not on file  Food Insecurity:   . Worried About Charity fundraiser in the Last Year: Not on file  . Ran Out of Food in the Last Year: Not on file  Transportation Needs:   . Lack of Transportation (Medical): Not on file  . Lack of Transportation (Non-Medical): Not on file  Physical Activity:   . Days of Exercise per Week: Not on file  . Minutes of Exercise per Session: Not on file  Stress:   . Feeling of Stress : Not on file  Social Connections:   . Frequency of Communication with Friends and Family: Not on file  . Frequency of Social  Gatherings with Friends and Family: Not on file  . Attends Religious Services: Not on file  . Active Member of Clubs or Organizations: Not on file  . Attends Archivist Meetings: Not on file  . Marital Status: Not on file  Intimate Partner Violence:   . Fear of Current or Ex-Partner: Not on file  . Emotionally Abused: Not on file  . Physically Abused: Not on file  . Sexually Abused: Not on file    FAMILY HISTORY: Family History  Problem Relation Age of Onset  . Cancer Mother        lung; former smoker  . COPD Father        emphysema  . Diabetes Maternal Aunt   . Heart disease Maternal Uncle   . Heart disease Maternal Uncle   . Diabetes Maternal Aunt   . Stroke Neg Hx   . Hyperlipidemia Neg Hx   . Hypertension Neg Hx     ALLERGIES:  has No Known Allergies.  MEDICATIONS:  Current Outpatient Medications  Medication Sig Dispense Refill  . dexamethasone (DECADRON) 4 MG tablet Take 2 tablets (8 mg total) by mouth daily. Start the day after bendamustine chemotherapy for 2 days. Take with food. 30 tablet 1  . lidocaine-prilocaine (EMLA) cream Apply to affected area once 30 g 3  . ondansetron (ZOFRAN) 8 MG tablet Take 1 tablet (8 mg total) by mouth 2 (two) times daily as needed for refractory nausea / vomiting. Start on day 2 after bendamustine chemo. 30 tablet 1  . prochlorperazine (COMPAZINE) 10 MG tablet Take 1 tablet (10 mg total) by mouth every 6 (six) hours as needed (Nausea or vomiting). 30 tablet 1  . acyclovir (ZOVIRAX) 400 MG tablet Take 1 tablet (400 mg total) by mouth daily. 30 tablet 3  . allopurinol (ZYLOPRIM) 300 MG tablet Take 1 tablet (300 mg total) by mouth daily. 30 tablet 3  . Naphazoline HCl (CLEAR EYES OP) Place 1 drop into both eyes daily as needed (redness). (Patient not taking: Reported on 05/28/2020)    . sildenafil (VIAGRA) 100 MG tablet Take 0.5-1 tablets (50-100 mg total) by mouth daily as needed for erectile dysfunction. 10 tablet 0   No current  facility-administered medications for this visit.   Facility-Administered Medications Ordered in Other Visits  Medication Dose Route Frequency Provider Last Rate Last Admin  . bendamustine (BENDEKA) 200 mg in sodium chloride 0.9 % 50 mL (3.4483 mg/mL) chemo infusion  90 mg/m2 (Treatment Plan Recorded) Intravenous Once Orson Slick, MD        REVIEW OF SYSTEMS:   Constitutional: ( - ) fevers, ( - )  chills , ( - ) night sweats Eyes: ( - ) blurriness of vision, ( - ) double vision, ( - ) watery eyes Ears, nose, mouth, throat, and  face: ( - ) mucositis, ( - ) sore throat Respiratory: ( - ) cough, ( - ) dyspnea, ( - ) wheezes Cardiovascular: ( - ) palpitation, ( - ) chest discomfort, ( - ) lower extremity swelling Gastrointestinal:  ( - ) nausea, ( - ) heartburn, ( - ) change in bowel habits Skin: ( - ) abnormal skin rashes Lymphatics: ( - ) new lymphadenopathy, ( - ) easy bruising Neurological: ( - ) numbness, ( - ) tingling, ( - ) new weaknesses Behavioral/Psych: ( - ) mood change, ( - ) new changes  All other systems were reviewed with the patient and are negative.  PHYSICAL EXAMINATION: ECOG PERFORMANCE STATUS: 1 - Symptomatic but completely ambulatory  Vitals:   07/23/20 1021  BP: 134/72  Pulse: (!) 57  Resp: 17  Temp: (!) 96.6 F (35.9 C)  SpO2: 99%   Filed Weights   07/23/20 1021  Weight: 226 lb 11.2 oz (102.8 kg)    GENERAL: well appearing middle aged Caucasian male in NAD  SKIN: skin color, texture, turgor are normal, no rashes or significant lesions EYES: conjunctiva are pink and non-injected, sclera clear NECK: supple, non-tender LYMPH:  no palpable lymphadenopathy in the cervical chain LUNGS: clear to auscultation and percussion with normal breathing effort HEART: regular rate & rhythm and no murmurs and no lower extremity edema Musculoskeletal: no cyanosis of digits and no clubbing  PSYCH: alert & oriented x 3, fluent speech NEURO: no focal motor/sensory  deficits  LABORATORY DATA:  I have reviewed the data as listed CBC Latest Ref Rng & Units 07/23/2020 06/25/2020 06/19/2020  WBC 4.0 - 10.5 K/uL 3.5(L) 2.9(L) 4.4  Hemoglobin 13.0 - 17.0 g/dL 12.9(L) 12.2(L) 11.5(L)  Hematocrit 39 - 52 % 37.1(L) 35.9(L) 34.3(L)  Platelets 150 - 400 K/uL 191 212 196    CMP Latest Ref Rng & Units 07/23/2020 06/25/2020 06/19/2020  Glucose 70 - 99 mg/dL 109(H) 89 82  BUN 8 - 23 mg/dL 24(H) 18 26(H)  Creatinine 0.61 - 1.24 mg/dL 1.01 0.91 1.05  Sodium 135 - 145 mmol/L 141 138 139  Potassium 3.5 - 5.1 mmol/L 3.9 4.0 4.0  Chloride 98 - 111 mmol/L 107 104 106  CO2 22 - 32 mmol/L 25 25 23   Calcium 8.9 - 10.3 mg/dL 9.2 9.5 9.9  Total Protein 6.5 - 8.1 g/dL 7.1 7.0 7.2  Total Bilirubin 0.3 - 1.2 mg/dL 0.4 0.4 0.5  Alkaline Phos 38 - 126 U/L 109 115 104  AST 15 - 41 U/L 14(L) 14(L) 11(L)  ALT 0 - 44 U/L 13 10 9    RADIOGRAPHIC STUDIES: I have personally reviewed the radiological images as listed and agreed with the findings in the report: lymphadenopathy in the neck, chest, and abdomen, with most pronounced nodes in the abdomen. Bone also has more FDG avidity than would be expected.   No results found.  ASSESSMENT & PLAN BRUK TUMOLO 64 y.o. male with medical history significant for newly diagnosed follicular lymphoma who presents for a follow up visit. Today is Cycle 4 Day 1 of Bendamustine/Rituximab therapy.   On exam today Mr. Mark is at his baseline level of health.  He only had a single day of symptoms after cycle 3 of chemotherapy.  He is willing and able to proceed with cycle 4 at this time.  FLIPI-2 Score: 4  (high risk). Indication for treatment: bulky lymphadenopathy with 15 x 9 cm conglomerate of lymph nodes in the abdomen.  The regimen of choice  for this patient is Bendamustine/Rituximab. Treatment will consist of 6 cycles, each lasting 28 days. The patient will receive Bendamustine 90mg /m2 IV on Day 1 and Day 2 in addition to Rituximab 375mg /m2 IV  on Day 1. This is being administered with curative intent. Administered x 6 cycles.   # Follicular Lymphoma. Stage III --today is Cycle 4 Day 1 of R-Benda therapy. This is being administered with curative intent. OK to proceed based on clinic exam and labs.  --no clear indication for imaging at this time as the patient is tolerating treatment well with no adverse effects or signs concerning for progression.  --supportive treatments as noted below.  --RTC in 4 weeks for Cycle 5 Day 1. Given stability of labs no need for q 2 week lab checks.   #Fertility Preservation  --referred patient to [facility] for Fertility Clinic and Sperm preservation.  --unfortunately patient was noted to be sterile on exam, no sperm to freeze.   #Supportive Care --provided patient with zofran 8mg  PO q8H PRN and compazine 10mg  PO q6H PRN for antiemetic therapy. He has not required this.  --acyclovir 300mg  PO daily for VZV prophyalxis --continue allopurinol 300mg  PO daily as TLS prophylaxis.  --do not anticipate need for GCSF or transfusion support  No orders of the defined types were placed in this encounter.  All questions were answered. The patient knows to call the clinic with any problems, questions or concerns.  A total of more than 30 minutes were spent on this encounter and over half of that time was spent on counseling and coordination of care as outlined above.   Ledell Peoples, MD Department of Hematology/Oncology [place] at [facility] Phone: [phone] Pager: [phone] Email: [email or web address]  07/23/2020 1:26 PM

## 2020-07-23 ENCOUNTER — Other Ambulatory Visit: Payer: Self-pay

## 2020-07-23 ENCOUNTER — Inpatient Hospital Stay: Payer: Medicare Other

## 2020-07-23 ENCOUNTER — Encounter: Payer: Self-pay | Admitting: Hematology and Oncology

## 2020-07-23 ENCOUNTER — Inpatient Hospital Stay: Payer: Medicare Other | Attending: Hematology and Oncology | Admitting: Hematology and Oncology

## 2020-07-23 VITALS — BP 137/77 | HR 52 | Temp 97.6°F | Resp 17

## 2020-07-23 VITALS — BP 134/72 | HR 57 | Temp 96.6°F | Resp 17 | Ht 71.5 in | Wt 226.7 lb

## 2020-07-23 DIAGNOSIS — C8233 Follicular lymphoma grade IIIa, intra-abdominal lymph nodes: Secondary | ICD-10-CM | POA: Diagnosis not present

## 2020-07-23 DIAGNOSIS — Z5111 Encounter for antineoplastic chemotherapy: Secondary | ICD-10-CM | POA: Insufficient documentation

## 2020-07-23 DIAGNOSIS — C8293 Follicular lymphoma, unspecified, intra-abdominal lymph nodes: Secondary | ICD-10-CM | POA: Diagnosis not present

## 2020-07-23 DIAGNOSIS — C8290 Follicular lymphoma, unspecified, unspecified site: Secondary | ICD-10-CM | POA: Insufficient documentation

## 2020-07-23 DIAGNOSIS — R591 Generalized enlarged lymph nodes: Secondary | ICD-10-CM | POA: Diagnosis not present

## 2020-07-23 DIAGNOSIS — Z79899 Other long term (current) drug therapy: Secondary | ICD-10-CM | POA: Insufficient documentation

## 2020-07-23 LAB — CMP (CANCER CENTER ONLY)
ALT: 13 U/L (ref 0–44)
AST: 14 U/L — ABNORMAL LOW (ref 15–41)
Albumin: 3.8 g/dL (ref 3.5–5.0)
Alkaline Phosphatase: 109 U/L (ref 38–126)
Anion gap: 9 (ref 5–15)
BUN: 24 mg/dL — ABNORMAL HIGH (ref 8–23)
CO2: 25 mmol/L (ref 22–32)
Calcium: 9.2 mg/dL (ref 8.9–10.3)
Chloride: 107 mmol/L (ref 98–111)
Creatinine: 1.01 mg/dL (ref 0.61–1.24)
GFR, Est AFR Am: >60 mL/min (ref >60)
GFR, Estimated: >60 mL/min (ref >60)
Glucose, Bld: 109 mg/dL — ABNORMAL HIGH (ref 70–99)
Potassium: 3.9 mmol/L (ref 3.5–5.1)
Sodium: 141 mmol/L (ref 135–145)
Total Bilirubin: 0.4 mg/dL (ref 0.3–1.2)
Total Protein: 7.1 g/dL (ref 6.5–8.1)

## 2020-07-23 LAB — CBC WITH DIFFERENTIAL (CANCER CENTER ONLY)
Abs Immature Granulocytes: 0.04 10*3/uL (ref 0.00–0.07)
Basophils Absolute: 0 10*3/uL (ref 0.0–0.1)
Basophils Relative: 1 %
Eosinophils Absolute: 0.2 10*3/uL (ref 0.0–0.5)
Eosinophils Relative: 5 %
HCT: 37.1 % — ABNORMAL LOW (ref 39.0–52.0)
Hemoglobin: 12.9 g/dL — ABNORMAL LOW (ref 13.0–17.0)
Immature Granulocytes: 1 %
Lymphocytes Relative: 11 %
Lymphs Abs: 0.4 10*3/uL — ABNORMAL LOW (ref 0.7–4.0)
MCH: 31.5 pg (ref 26.0–34.0)
MCHC: 34.8 g/dL (ref 30.0–36.0)
MCV: 90.5 fL (ref 80.0–100.0)
Monocytes Absolute: 0.5 10*3/uL (ref 0.1–1.0)
Monocytes Relative: 14 %
Neutro Abs: 2.4 10*3/uL (ref 1.7–7.7)
Neutrophils Relative %: 68 %
Platelet Count: 191 10*3/uL (ref 150–400)
RBC: 4.1 MIL/uL — ABNORMAL LOW (ref 4.22–5.81)
RDW: 14.1 % (ref 11.5–15.5)
WBC Count: 3.5 10*3/uL — ABNORMAL LOW (ref 4.0–10.5)
nRBC: 0 % (ref 0.0–0.2)

## 2020-07-23 LAB — LACTATE DEHYDROGENASE: LDH: 175 U/L (ref 98–192)

## 2020-07-23 MED ORDER — SODIUM CHLORIDE 0.9 % IV SOLN
375.0000 mg/m2 | Freq: Once | INTRAVENOUS | Status: AC
  Administered 2020-07-23: 800 mg via INTRAVENOUS
  Filled 2020-07-23: qty 50

## 2020-07-23 MED ORDER — HEPARIN SOD (PORK) LOCK FLUSH 100 UNIT/ML IV SOLN
500.0000 [IU] | Freq: Once | INTRAVENOUS | Status: AC | PRN
  Administered 2020-07-23: 500 [IU]
  Filled 2020-07-23: qty 5

## 2020-07-23 MED ORDER — DIPHENHYDRAMINE HCL 25 MG PO CAPS
50.0000 mg | ORAL_CAPSULE | Freq: Once | ORAL | Status: AC
  Administered 2020-07-23: 50 mg via ORAL

## 2020-07-23 MED ORDER — PALONOSETRON HCL INJECTION 0.25 MG/5ML
INTRAVENOUS | Status: AC
  Filled 2020-07-23: qty 5

## 2020-07-23 MED ORDER — SODIUM CHLORIDE 0.9 % IV SOLN
Freq: Once | INTRAVENOUS | Status: AC
  Filled 2020-07-23: qty 250

## 2020-07-23 MED ORDER — ACETAMINOPHEN 325 MG PO TABS
ORAL_TABLET | ORAL | Status: AC
  Filled 2020-07-23: qty 2

## 2020-07-23 MED ORDER — ACETAMINOPHEN 325 MG PO TABS
650.0000 mg | ORAL_TABLET | Freq: Once | ORAL | Status: AC
  Administered 2020-07-23: 650 mg via ORAL

## 2020-07-23 MED ORDER — SODIUM CHLORIDE 0.9 % IV SOLN
10.0000 mg | Freq: Once | INTRAVENOUS | Status: AC
  Administered 2020-07-23: 10 mg via INTRAVENOUS
  Filled 2020-07-23: qty 10

## 2020-07-23 MED ORDER — SODIUM CHLORIDE 0.9 % IV SOLN
90.0000 mg/m2 | Freq: Once | INTRAVENOUS | Status: AC
  Administered 2020-07-23: 200 mg via INTRAVENOUS
  Filled 2020-07-23: qty 8

## 2020-07-23 MED ORDER — PALONOSETRON HCL INJECTION 0.25 MG/5ML
0.2500 mg | Freq: Once | INTRAVENOUS | Status: AC
  Administered 2020-07-23: 0.25 mg via INTRAVENOUS

## 2020-07-23 MED ORDER — DIPHENHYDRAMINE HCL 25 MG PO CAPS
ORAL_CAPSULE | ORAL | Status: AC
  Filled 2020-07-23: qty 2

## 2020-07-23 MED ORDER — SODIUM CHLORIDE 0.9% FLUSH
10.0000 mL | INTRAVENOUS | Status: DC | PRN
  Administered 2020-07-23: 10 mL
  Filled 2020-07-23: qty 10

## 2020-07-24 ENCOUNTER — Other Ambulatory Visit: Payer: Self-pay

## 2020-07-24 ENCOUNTER — Inpatient Hospital Stay: Payer: Medicare Other

## 2020-07-24 VITALS — BP 145/82 | HR 58 | Temp 98.2°F | Resp 20

## 2020-07-24 DIAGNOSIS — Z79899 Other long term (current) drug therapy: Secondary | ICD-10-CM | POA: Diagnosis not present

## 2020-07-24 DIAGNOSIS — Z5111 Encounter for antineoplastic chemotherapy: Secondary | ICD-10-CM | POA: Diagnosis not present

## 2020-07-24 DIAGNOSIS — C8233 Follicular lymphoma grade IIIa, intra-abdominal lymph nodes: Secondary | ICD-10-CM

## 2020-07-24 DIAGNOSIS — C8290 Follicular lymphoma, unspecified, unspecified site: Secondary | ICD-10-CM | POA: Diagnosis not present

## 2020-07-24 MED ORDER — HEPARIN SOD (PORK) LOCK FLUSH 100 UNIT/ML IV SOLN
500.0000 [IU] | Freq: Once | INTRAVENOUS | Status: AC | PRN
  Administered 2020-07-24: 500 [IU]
  Filled 2020-07-24: qty 5

## 2020-07-24 MED ORDER — SODIUM CHLORIDE 0.9 % IV SOLN
90.0000 mg/m2 | Freq: Once | INTRAVENOUS | Status: AC
  Administered 2020-07-24: 200 mg via INTRAVENOUS
  Filled 2020-07-24: qty 8

## 2020-07-24 MED ORDER — SODIUM CHLORIDE 0.9 % IV SOLN
10.0000 mg | Freq: Once | INTRAVENOUS | Status: AC
  Administered 2020-07-24: 10 mg via INTRAVENOUS
  Filled 2020-07-24: qty 10

## 2020-07-24 MED ORDER — SODIUM CHLORIDE 0.9 % IV SOLN
Freq: Once | INTRAVENOUS | Status: AC
  Filled 2020-07-24: qty 250

## 2020-07-24 MED ORDER — SODIUM CHLORIDE 0.9% FLUSH
10.0000 mL | INTRAVENOUS | Status: DC | PRN
  Administered 2020-07-24: 10 mL
  Filled 2020-07-24: qty 10

## 2020-07-24 NOTE — Patient Instructions (Signed)
[facility] Discharge Instructions for Patients Receiving Chemotherapy  Today you received the following chemotherapy agent: [place] (Bendamustine)  To help prevent nausea and vomiting after your treatment, we encourage you to take your nausea medication as directed by your MD.   If you develop nausea and vomiting that is not controlled by your nausea medication, call the clinic.   BELOW ARE SYMPTOMS THAT SHOULD BE REPORTED IMMEDIATELY:  *FEVER GREATER THAN 100.5 F  *CHILLS WITH OR WITHOUT FEVER  NAUSEA AND VOMITING THAT IS NOT CONTROLLED WITH YOUR NAUSEA MEDICATION  *UNUSUAL SHORTNESS OF BREATH  *UNUSUAL BRUISING OR BLEEDING  TENDERNESS IN MOUTH AND THROAT WITH OR WITHOUT PRESENCE OF ULCERS  *URINARY PROBLEMS  *BOWEL PROBLEMS  UNUSUAL RASH Items with * indicate a potential emergency and should be followed up as soon as possible.  Feel free to call the clinic should you have any questions or concerns. The clinic phone number is [phone].  Please show the [facility] at check-in to the Emergency Department and triage nurse.

## 2020-08-18 ENCOUNTER — Telehealth: Payer: Self-pay | Admitting: *Deleted

## 2020-08-18 NOTE — Telephone Encounter (Signed)
TCT patient regarding appts for this week. He will be out of town for his 10/13 appt. Appts were changed to 08/25/20 and 08/26/20. He states he is good with that schedule.

## 2020-08-20 ENCOUNTER — Inpatient Hospital Stay: Payer: Medicare Other

## 2020-08-20 ENCOUNTER — Inpatient Hospital Stay: Payer: Medicare Other | Admitting: Hematology and Oncology

## 2020-08-21 ENCOUNTER — Inpatient Hospital Stay: Payer: Medicare Other

## 2020-08-24 NOTE — Progress Notes (Signed)
[facility] [phone]   [phone]) [phone]  PROGRESS NOTE  Patient Care Team: Tysinger, Leward Quan as PCP - General (Family Medicine)  Hematological/Oncological History # Follicular Lymphoma. Stage III 1) 01/07/2020: CT Neck W contrast showed cervical lymphadenopathy bilaterally, left greater than right. Numerous enlarged lymph nodes are present 2)  01/22/2020: [place] Core biopsy performed which revealed overall features consistent with involvement by non-Hodgkin's B-cell lymphoma and the morphologic and phenotypic findings favor follicular lymphoma.  3) 01/31/2020: establish care with Dr. Lorenso Courier  4) 02/12/2020: PET CT scan demonstrates bulky adenopathy in the neck, chest, abdomen and pelvis and diffuse skeletal uptake is suspicious for (but not diagnostic) of marrow involvement. 5) 04/30/2020: Cycle 1 Day 1 of Bendamustine/Rituximab 6) 05/29/2020: Cycle 2 Day 1 of Bendamustine/Rituximab 7) 06/25/2020: Cycle 3 Day 1 of Bendamustine/Rituximab 8) 07/23/2020: Cycle 4 Day 1 of Bendamustine/Rituximab 9) 08/25/2020: Cycle 5 Day 1 of Bendamustine/Rituximab  Interval History:  Jorge Gould 64 y.o. male with medical history significant for Stage III follicular lymphoma who presents for a follow up visit. The patient's last visit was on 07/23/2020 at which time he started Cycle 4 Day 1 of Bendamustine/Rituximab. In the interim since the last visit Jorge Gould has completed Cycle 4 of therapy with stable counts and no ED visits/hospitailzations.   On exam today Jorge Gould reports he has been well in the interim since his last visit.  He did have an episode last week when he drank a vanilla latte in [place] and had nausea, vomiting which resolved after a nap.  He thinks it may have been related more to the latte than his chemotherapy treatment.  He notes he is not had any issues with fevers, chills, sweats, nausea, vomiting or diarrhea otherwise.  He reports that he said no shortness  of breath or chest pain.  He has not noticed any enlargement of his lymph nodes, and his appetite remains good.  His weight is actually increased several pounds since his last visit.  He is still able to do work at his usual strenuous level without any limitations.  He is willing and able to proceed with chemotherapy today.  A full 10 point ROS is listed below.  MEDICAL HISTORY:  Past Medical History:  Diagnosis Date  . Obesity     SURGICAL HISTORY: Past Surgical History:  Procedure Laterality Date  . ANKLE FRACTURE SURGERY  2016   right, trauma repair after fall down stairs  . HERNIA REPAIR     umbilical  . IR IMAGING GUIDED PORT INSERTION  04/09/2020  . TONSILLECTOMY      SOCIAL HISTORY: Social History   Socioeconomic History  . Marital status: Married    Spouse name: Not on file  . Number of children: Not on file  . Years of education: Not on file  . Highest education level: Not on file  Occupational History  . Not on file  Tobacco Use  . Smoking status: Never Smoker  . Smokeless tobacco: Never Used  Vaping Use  . Vaping Use: Never used  Substance and Sexual Activity  . Alcohol use: Yes    Alcohol/week: 3.0 standard drinks    Types: 3 Shots of liquor per week  . Drug use: Not Currently  . Sexual activity: Not on file  Other Topics Concern  . Not on file  Social History Narrative   Engaged, plan to marry in March 2021, been together since 2014.  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   Most exercise on the job, working 7 days per week.   No children, but fiance has 5 children.    11/2019   Social Determinants of Health   Financial Resource Strain:   . Difficulty of Paying Living Expenses: Not on file  Food Insecurity:   . Worried About Charity fundraiser in the Last Year: Not on file  . Ran Out of Food in the Last Year: Not on file  Transportation Needs:   . Lack of Transportation (Medical): Not on file  . Lack of Transportation (Non-Medical):  Not on file  Physical Activity:   . Days of Exercise per Week: Not on file  . Minutes of Exercise per Session: Not on file  Stress:   . Feeling of Stress : Not on file  Social Connections:   . Frequency of Communication with Friends and Family: Not on file  . Frequency of Social Gatherings with Friends and Family: Not on file  . Attends Religious Services: Not on file  . Active Member of Clubs or Organizations: Not on file  . Attends Archivist Meetings: Not on file  . Marital Status: Not on file  Intimate Partner Violence:   . Fear of Current or Ex-Partner: Not on file  . Emotionally Abused: Not on file  . Physically Abused: Not on file  . Sexually Abused: Not on file    FAMILY HISTORY: Family History  Problem Relation Age of Onset  . Cancer Mother        lung; former smoker  . COPD Father        emphysema  . Diabetes Maternal Aunt   . Heart disease Maternal Uncle   . Heart disease Maternal Uncle   . Diabetes Maternal Aunt   . Stroke Neg Hx   . Hyperlipidemia Neg Hx   . Hypertension Neg Hx     ALLERGIES:  has No Known Allergies.  MEDICATIONS:  Current Outpatient Medications  Medication Sig Dispense Refill  . acyclovir (ZOVIRAX) 400 MG tablet Take 1 tablet (400 mg total) by mouth daily. 30 tablet 3  . allopurinol (ZYLOPRIM) 300 MG tablet Take 1 tablet (300 mg total) by mouth daily. 30 tablet 3  . dexamethasone (DECADRON) 4 MG tablet Take 2 tablets (8 mg total) by mouth daily. Start the day after bendamustine chemotherapy for 2 days. Take with food. 30 tablet 1  . lidocaine-prilocaine (EMLA) cream Apply to affected area once 30 g 3  . Naphazoline HCl (CLEAR EYES OP) Place 1 drop into both eyes daily as needed (redness). (Patient not taking: Reported on 05/28/2020)    . ondansetron (ZOFRAN) 8 MG tablet Take 1 tablet (8 mg total) by mouth 2 (two) times daily as needed for refractory nausea / vomiting. Start on day 2 after bendamustine chemo. 30 tablet 1  .  prochlorperazine (COMPAZINE) 10 MG tablet Take 1 tablet (10 mg total) by mouth every 6 (six) hours as needed (Nausea or vomiting). 30 tablet 1  . sildenafil (VIAGRA) 100 MG tablet Take 0.5-1 tablets (50-100 mg total) by mouth daily as needed for erectile dysfunction. 10 tablet 0   No current facility-administered medications for this visit.    REVIEW OF SYSTEMS:   Constitutional: ( - ) fevers, ( - )  chills , ( - ) night sweats Eyes: ( - ) blurriness of vision, ( - ) double vision, ( - ) watery eyes Ears, nose, mouth, throat, and face: ( - ) mucositis, ( - )  sore throat Respiratory: ( - ) cough, ( - ) dyspnea, ( - ) wheezes Cardiovascular: ( - ) palpitation, ( - ) chest discomfort, ( - ) lower extremity swelling Gastrointestinal:  ( - ) nausea, ( - ) heartburn, ( - ) change in bowel habits Skin: ( - ) abnormal skin rashes Lymphatics: ( - ) new lymphadenopathy, ( - ) easy bruising Neurological: ( - ) numbness, ( - ) tingling, ( - ) new weaknesses Behavioral/Psych: ( - ) mood change, ( - ) new changes  All other systems were reviewed with the patient and are negative.  PHYSICAL EXAMINATION: ECOG PERFORMANCE STATUS: 1 - Symptomatic but completely ambulatory  There were no vitals filed for this visit. There were no vitals filed for this visit.  GENERAL: well appearing middle aged Caucasian male in NAD  SKIN: skin color, texture, turgor are normal, no rashes or significant lesions EYES: conjunctiva are pink and non-injected, sclera clear NECK: supple, non-tender LYMPH:  no palpable lymphadenopathy in the cervical chain LUNGS: clear to auscultation and percussion with normal breathing effort HEART: regular rate & rhythm and no murmurs and no lower extremity edema Musculoskeletal: no cyanosis of digits and no clubbing  PSYCH: alert & oriented x 3, fluent speech NEURO: no focal motor/sensory deficits  LABORATORY DATA:  I have reviewed the data as listed CBC Latest Ref Rng & Units  07/23/2020 06/25/2020 06/19/2020  WBC 4.0 - 10.5 K/uL 3.5(L) 2.9(L) 4.4  Hemoglobin 13.0 - 17.0 g/dL 12.9(L) 12.2(L) 11.5(L)  Hematocrit 39 - 52 % 37.1(L) 35.9(L) 34.3(L)  Platelets 150 - 400 K/uL 191 212 196    CMP Latest Ref Rng & Units 07/23/2020 06/25/2020 06/19/2020  Glucose 70 - 99 mg/dL 109(H) 89 82  BUN 8 - 23 mg/dL 24(H) 18 26(H)  Creatinine 0.61 - 1.24 mg/dL 1.01 0.91 1.05  Sodium 135 - 145 mmol/L 141 138 139  Potassium 3.5 - 5.1 mmol/L 3.9 4.0 4.0  Chloride 98 - 111 mmol/L 107 104 106  CO2 22 - 32 mmol/L 25 25 23   Calcium 8.9 - 10.3 mg/dL 9.2 9.5 9.9  Total Protein 6.5 - 8.1 g/dL 7.1 7.0 7.2  Total Bilirubin 0.3 - 1.2 mg/dL 0.4 0.4 0.5  Alkaline Phos 38 - 126 U/L 109 115 104  AST 15 - 41 U/L 14(L) 14(L) 11(L)  ALT 0 - 44 U/L 13 10 9    RADIOGRAPHIC STUDIES: I have personally reviewed the radiological images as listed and agreed with the findings in the report: lymphadenopathy in the neck, chest, and abdomen, with most pronounced nodes in the abdomen. Bone also has more FDG avidity than would be expected.   No results found.  ASSESSMENT & PLAN Jorge Gould 64 y.o. male with medical history significant for newly diagnosed follicular lymphoma who presents for a follow up visit. Today is Cycle 5 Day 1 of Bendamustine/Rituximab therapy.   On exam today Jorge Gould is at his baseline level of health.  He had an episode of nausea and vomiting last week which he thinks may have been related to a vanilla latte.  Otherwise he has been asymptomatic.  He is willing and able to proceed with cycle 5 at this time.  FLIPI-2 Score: 4  (high risk). Indication for treatment: bulky lymphadenopathy with 15 x 9 cm conglomerate of lymph nodes in the abdomen.  The regimen of choice for this patient is Bendamustine/Rituximab. Treatment will consist of 6 cycles, each lasting 28 days. The patient will receive Bendamustine 90mg /m2  IV on Day 1 and Day 2 in addition to Rituximab 375mg /m2 IV on Day 1. This  is being administered with curative intent. Administered x 6 cycles.   # Follicular Lymphoma. Stage III --today is Cycle 5 Day 1 of R-Benda therapy. This is being administered with curative intent. OK to proceed based on clinic exam and labs.  --no clear indication for imaging at this time as the patient is tolerating treatment well with no adverse effects or signs concerning for progression.  --supportive treatments as noted below.  --RTC in 4 weeks for Cycle 6 Day 1. Given stability of labs no need for interval lab checks.   #Fertility Preservation  --referred patient to [facility] for Fertility Clinic and Sperm preservation.  --unfortunately patient was noted to be sterile on exam, no sperm to freeze.   #Supportive Care --provided patient with zofran 8mg  PO q8H PRN and compazine 10mg  PO q6H PRN for antiemetic therapy. He has not required this.  --acyclovir 300mg  PO daily for VZV prophyalxis --continue allopurinol 300mg  PO daily as TLS prophylaxis.  --do not anticipate need for GCSF or transfusion support  No orders of the defined types were placed in this encounter.  All questions were answered. The patient knows to call the clinic with any problems, questions or concerns.  A total of more than 30 minutes were spent on this encounter and over half of that time was spent on counseling and coordination of care as outlined above.   Jorge Peoples, MD Department of Hematology/Oncology [place] at [facility] Phone: [phone] Pager: [phone] Email: [email or web address]  08/24/2020 3:10 PM

## 2020-08-25 ENCOUNTER — Inpatient Hospital Stay (HOSPITAL_BASED_OUTPATIENT_CLINIC_OR_DEPARTMENT_OTHER): Payer: Medicare Other | Admitting: Hematology and Oncology

## 2020-08-25 ENCOUNTER — Encounter: Payer: Self-pay | Admitting: Hematology and Oncology

## 2020-08-25 ENCOUNTER — Inpatient Hospital Stay: Payer: Medicare Other | Attending: Hematology and Oncology

## 2020-08-25 ENCOUNTER — Inpatient Hospital Stay: Payer: Medicare Other

## 2020-08-25 ENCOUNTER — Other Ambulatory Visit: Payer: Self-pay

## 2020-08-25 VITALS — BP 142/93 | HR 54 | Temp 98.5°F | Resp 18

## 2020-08-25 VITALS — BP 136/82 | HR 82 | Temp 97.2°F | Resp 18 | Ht 71.5 in | Wt 231.4 lb

## 2020-08-25 DIAGNOSIS — Z79899 Other long term (current) drug therapy: Secondary | ICD-10-CM | POA: Insufficient documentation

## 2020-08-25 DIAGNOSIS — R591 Generalized enlarged lymph nodes: Secondary | ICD-10-CM

## 2020-08-25 DIAGNOSIS — C8233 Follicular lymphoma grade IIIa, intra-abdominal lymph nodes: Secondary | ICD-10-CM | POA: Diagnosis not present

## 2020-08-25 DIAGNOSIS — Z5111 Encounter for antineoplastic chemotherapy: Secondary | ICD-10-CM | POA: Diagnosis not present

## 2020-08-25 DIAGNOSIS — C8290 Follicular lymphoma, unspecified, unspecified site: Secondary | ICD-10-CM | POA: Diagnosis not present

## 2020-08-25 DIAGNOSIS — Z95828 Presence of other vascular implants and grafts: Secondary | ICD-10-CM

## 2020-08-25 LAB — CBC WITH DIFFERENTIAL (CANCER CENTER ONLY)
Abs Immature Granulocytes: 0.06 10*3/uL (ref 0.00–0.07)
Basophils Absolute: 0 10*3/uL (ref 0.0–0.1)
Basophils Relative: 1 %
Eosinophils Absolute: 0.2 10*3/uL (ref 0.0–0.5)
Eosinophils Relative: 5 %
HCT: 35.5 % — ABNORMAL LOW (ref 39.0–52.0)
Hemoglobin: 12.4 g/dL — ABNORMAL LOW (ref 13.0–17.0)
Immature Granulocytes: 1 %
Lymphocytes Relative: 5 %
Lymphs Abs: 0.2 10*3/uL — ABNORMAL LOW (ref 0.7–4.0)
MCH: 31 pg (ref 26.0–34.0)
MCHC: 34.9 g/dL (ref 30.0–36.0)
MCV: 88.8 fL (ref 80.0–100.0)
Monocytes Absolute: 0.5 10*3/uL (ref 0.1–1.0)
Monocytes Relative: 10 %
Neutro Abs: 3.9 10*3/uL (ref 1.7–7.7)
Neutrophils Relative %: 78 %
Platelet Count: 160 10*3/uL (ref 150–400)
RBC: 4 MIL/uL — ABNORMAL LOW (ref 4.22–5.81)
RDW: 13.1 % (ref 11.5–15.5)
WBC Count: 4.9 10*3/uL (ref 4.0–10.5)
nRBC: 0 % (ref 0.0–0.2)

## 2020-08-25 LAB — CMP (CANCER CENTER ONLY)
ALT: 13 U/L (ref 0–44)
AST: 14 U/L — ABNORMAL LOW (ref 15–41)
Albumin: 3.8 g/dL (ref 3.5–5.0)
Alkaline Phosphatase: 103 U/L (ref 38–126)
Anion gap: 7 (ref 5–15)
BUN: 18 mg/dL (ref 8–23)
CO2: 28 mmol/L (ref 22–32)
Calcium: 9.5 mg/dL (ref 8.9–10.3)
Chloride: 106 mmol/L (ref 98–111)
Creatinine: 0.85 mg/dL (ref 0.61–1.24)
GFR, Estimated: >60 mL/min (ref >60)
Glucose, Bld: 89 mg/dL (ref 70–99)
Potassium: 4.1 mmol/L (ref 3.5–5.1)
Sodium: 141 mmol/L (ref 135–145)
Total Bilirubin: 0.4 mg/dL (ref 0.3–1.2)
Total Protein: 6.9 g/dL (ref 6.5–8.1)

## 2020-08-25 LAB — LACTATE DEHYDROGENASE: LDH: 192 U/L (ref 98–192)

## 2020-08-25 MED ORDER — PALONOSETRON HCL INJECTION 0.25 MG/5ML
INTRAVENOUS | Status: AC
  Filled 2020-08-25: qty 5

## 2020-08-25 MED ORDER — SODIUM CHLORIDE 0.9% FLUSH
10.0000 mL | Freq: Once | INTRAVENOUS | Status: AC
  Administered 2020-08-25: 10 mL
  Filled 2020-08-25: qty 10

## 2020-08-25 MED ORDER — DIPHENHYDRAMINE HCL 25 MG PO CAPS
ORAL_CAPSULE | ORAL | Status: AC
  Filled 2020-08-25: qty 2

## 2020-08-25 MED ORDER — HEPARIN SOD (PORK) LOCK FLUSH 100 UNIT/ML IV SOLN
500.0000 [IU] | Freq: Once | INTRAVENOUS | Status: AC | PRN
  Administered 2020-08-25: 500 [IU]
  Filled 2020-08-25: qty 5

## 2020-08-25 MED ORDER — SODIUM CHLORIDE 0.9 % IV SOLN
375.0000 mg/m2 | Freq: Once | INTRAVENOUS | Status: AC
  Administered 2020-08-25: 800 mg via INTRAVENOUS
  Filled 2020-08-25: qty 30

## 2020-08-25 MED ORDER — SODIUM CHLORIDE 0.9 % IV SOLN
Freq: Once | INTRAVENOUS | Status: AC
  Filled 2020-08-25: qty 250

## 2020-08-25 MED ORDER — SODIUM CHLORIDE 0.9% FLUSH
10.0000 mL | INTRAVENOUS | Status: DC | PRN
  Administered 2020-08-25: 10 mL
  Filled 2020-08-25: qty 10

## 2020-08-25 MED ORDER — DIPHENHYDRAMINE HCL 25 MG PO CAPS
50.0000 mg | ORAL_CAPSULE | Freq: Once | ORAL | Status: AC
  Administered 2020-08-25: 50 mg via ORAL

## 2020-08-25 MED ORDER — ACETAMINOPHEN 325 MG PO TABS
650.0000 mg | ORAL_TABLET | Freq: Once | ORAL | Status: AC
  Administered 2020-08-25: 650 mg via ORAL

## 2020-08-25 MED ORDER — SODIUM CHLORIDE 0.9 % IV SOLN
90.0000 mg/m2 | Freq: Once | INTRAVENOUS | Status: AC
  Administered 2020-08-25: 200 mg via INTRAVENOUS
  Filled 2020-08-25: qty 8

## 2020-08-25 MED ORDER — ACETAMINOPHEN 325 MG PO TABS
ORAL_TABLET | ORAL | Status: AC
  Filled 2020-08-25: qty 2

## 2020-08-25 MED ORDER — SODIUM CHLORIDE 0.9 % IV SOLN
10.0000 mg | Freq: Once | INTRAVENOUS | Status: AC
  Administered 2020-08-25: 10 mg via INTRAVENOUS
  Filled 2020-08-25: qty 10

## 2020-08-25 MED ORDER — PALONOSETRON HCL INJECTION 0.25 MG/5ML
0.2500 mg | Freq: Once | INTRAVENOUS | Status: AC
  Administered 2020-08-25: 0.25 mg via INTRAVENOUS

## 2020-08-25 NOTE — Patient Instructions (Signed)
[facility] Discharge Instructions for Patients Receiving Chemotherapy  Today you received the following chemotherapy agents: Rituximab and Benedka  To help prevent nausea and vomiting after your treatment, we encourage you to take your nausea medication as prescribed.    If you develop nausea and vomiting that is not controlled by your nausea medication, call the clinic.   BELOW ARE SYMPTOMS THAT SHOULD BE REPORTED IMMEDIATELY:  *FEVER GREATER THAN 100.5 F  *CHILLS WITH OR WITHOUT FEVER  NAUSEA AND VOMITING THAT IS NOT CONTROLLED WITH YOUR NAUSEA MEDICATION  *UNUSUAL SHORTNESS OF BREATH  *UNUSUAL BRUISING OR BLEEDING  TENDERNESS IN MOUTH AND THROAT WITH OR WITHOUT PRESENCE OF ULCERS  *URINARY PROBLEMS  *BOWEL PROBLEMS  UNUSUAL RASH Items with * indicate a potential emergency and should be followed up as soon as possible.  Feel free to call the clinic should you have any questions or concerns. The clinic phone number is [phone].  Please show the [place] at check-in to the Emergency Department and triage nurse.

## 2020-08-26 ENCOUNTER — Other Ambulatory Visit: Payer: Self-pay

## 2020-08-26 ENCOUNTER — Inpatient Hospital Stay: Payer: Medicare Other

## 2020-08-26 VITALS — BP 134/74 | HR 60 | Temp 98.3°F | Resp 20

## 2020-08-26 DIAGNOSIS — C8290 Follicular lymphoma, unspecified, unspecified site: Secondary | ICD-10-CM | POA: Diagnosis not present

## 2020-08-26 DIAGNOSIS — C8233 Follicular lymphoma grade IIIa, intra-abdominal lymph nodes: Secondary | ICD-10-CM

## 2020-08-26 DIAGNOSIS — Z79899 Other long term (current) drug therapy: Secondary | ICD-10-CM | POA: Diagnosis not present

## 2020-08-26 DIAGNOSIS — Z5111 Encounter for antineoplastic chemotherapy: Secondary | ICD-10-CM | POA: Diagnosis not present

## 2020-08-26 MED ORDER — SODIUM CHLORIDE 0.9 % IV SOLN
Freq: Once | INTRAVENOUS | Status: AC
  Filled 2020-08-26: qty 250

## 2020-08-26 MED ORDER — SODIUM CHLORIDE 0.9 % IV SOLN
10.0000 mg | Freq: Once | INTRAVENOUS | Status: AC
  Administered 2020-08-26: 10 mg via INTRAVENOUS
  Filled 2020-08-26: qty 10

## 2020-08-26 MED ORDER — SODIUM CHLORIDE 0.9 % IV SOLN
90.0000 mg/m2 | Freq: Once | INTRAVENOUS | Status: AC
  Administered 2020-08-26: 200 mg via INTRAVENOUS
  Filled 2020-08-26: qty 8

## 2020-08-26 MED ORDER — SODIUM CHLORIDE 0.9% FLUSH
10.0000 mL | INTRAVENOUS | Status: DC | PRN
  Administered 2020-08-26: 10 mL
  Filled 2020-08-26: qty 10

## 2020-08-26 MED ORDER — HEPARIN SOD (PORK) LOCK FLUSH 100 UNIT/ML IV SOLN
500.0000 [IU] | Freq: Once | INTRAVENOUS | Status: AC | PRN
  Administered 2020-08-26: 500 [IU]
  Filled 2020-08-26: qty 5

## 2020-08-26 NOTE — Patient Instructions (Signed)
[facility] Discharge Instructions for Patients Receiving Chemotherapy  Today you received the following chemotherapy agent: Bendeka  To help prevent nausea and vomiting after your treatment, we encourage you to take your nausea medication as directed by your MD.   If you develop nausea and vomiting that is not controlled by your nausea medication, call the clinic.   BELOW ARE SYMPTOMS THAT SHOULD BE REPORTED IMMEDIATELY:  *FEVER GREATER THAN 100.5 F  *CHILLS WITH OR WITHOUT FEVER  NAUSEA AND VOMITING THAT IS NOT CONTROLLED WITH YOUR NAUSEA MEDICATION  *UNUSUAL SHORTNESS OF BREATH  *UNUSUAL BRUISING OR BLEEDING  TENDERNESS IN MOUTH AND THROAT WITH OR WITHOUT PRESENCE OF ULCERS  *URINARY PROBLEMS  *BOWEL PROBLEMS  UNUSUAL RASH Items with * indicate a potential emergency and should be followed up as soon as possible.  Feel free to call the clinic should you have any questions or concerns. The clinic phone number is [phone].  Please show the [facility] at check-in to the Emergency Department and triage nurse.

## 2020-08-28 ENCOUNTER — Other Ambulatory Visit: Payer: Self-pay | Admitting: Hematology and Oncology

## 2020-08-28 DIAGNOSIS — C8233 Follicular lymphoma grade IIIa, intra-abdominal lymph nodes: Secondary | ICD-10-CM

## 2020-09-17 ENCOUNTER — Encounter: Payer: Self-pay | Admitting: Hematology and Oncology

## 2020-09-17 ENCOUNTER — Inpatient Hospital Stay: Payer: Medicare Other

## 2020-09-17 ENCOUNTER — Other Ambulatory Visit: Payer: Self-pay

## 2020-09-17 ENCOUNTER — Inpatient Hospital Stay: Payer: Medicare Other | Attending: Hematology and Oncology | Admitting: Hematology and Oncology

## 2020-09-17 VITALS — BP 134/85 | HR 59 | Temp 98.0°F | Resp 16

## 2020-09-17 VITALS — BP 147/91 | HR 54 | Temp 97.8°F | Resp 18 | Ht 71.5 in | Wt 233.9 lb

## 2020-09-17 DIAGNOSIS — C8293 Follicular lymphoma, unspecified, intra-abdominal lymph nodes: Secondary | ICD-10-CM | POA: Diagnosis not present

## 2020-09-17 DIAGNOSIS — C8233 Follicular lymphoma grade IIIa, intra-abdominal lymph nodes: Secondary | ICD-10-CM

## 2020-09-17 DIAGNOSIS — Z79899 Other long term (current) drug therapy: Secondary | ICD-10-CM | POA: Diagnosis not present

## 2020-09-17 DIAGNOSIS — Z5111 Encounter for antineoplastic chemotherapy: Secondary | ICD-10-CM | POA: Diagnosis not present

## 2020-09-17 DIAGNOSIS — C8290 Follicular lymphoma, unspecified, unspecified site: Secondary | ICD-10-CM | POA: Insufficient documentation

## 2020-09-17 LAB — CMP (CANCER CENTER ONLY)
ALT: 13 U/L (ref 0–44)
AST: 15 U/L (ref 15–41)
Albumin: 3.7 g/dL (ref 3.5–5.0)
Alkaline Phosphatase: 91 U/L (ref 38–126)
Anion gap: 6 (ref 5–15)
BUN: 17 mg/dL (ref 8–23)
CO2: 26 mmol/L (ref 22–32)
Calcium: 9.1 mg/dL (ref 8.9–10.3)
Chloride: 107 mmol/L (ref 98–111)
Creatinine: 0.96 mg/dL (ref 0.61–1.24)
GFR, Estimated: >60 mL/min (ref >60)
Glucose, Bld: 92 mg/dL (ref 70–99)
Potassium: 3.8 mmol/L (ref 3.5–5.1)
Sodium: 139 mmol/L (ref 135–145)
Total Bilirubin: 0.7 mg/dL (ref 0.3–1.2)
Total Protein: 6.8 g/dL (ref 6.5–8.1)

## 2020-09-17 LAB — CBC WITH DIFFERENTIAL (CANCER CENTER ONLY)
Abs Immature Granulocytes: 0.05 10*3/uL (ref 0.00–0.07)
Basophils Absolute: 0 10*3/uL (ref 0.0–0.1)
Basophils Relative: 1 %
Eosinophils Absolute: 0.3 10*3/uL (ref 0.0–0.5)
Eosinophils Relative: 7 %
HCT: 35.2 % — ABNORMAL LOW (ref 39.0–52.0)
Hemoglobin: 11.9 g/dL — ABNORMAL LOW (ref 13.0–17.0)
Immature Granulocytes: 1 %
Lymphocytes Relative: 6 %
Lymphs Abs: 0.2 10*3/uL — ABNORMAL LOW (ref 0.7–4.0)
MCH: 30.8 pg (ref 26.0–34.0)
MCHC: 33.8 g/dL (ref 30.0–36.0)
MCV: 91.2 fL (ref 80.0–100.0)
Monocytes Absolute: 0.6 10*3/uL (ref 0.1–1.0)
Monocytes Relative: 17 %
Neutro Abs: 2.5 10*3/uL (ref 1.7–7.7)
Neutrophils Relative %: 68 %
Platelet Count: 171 10*3/uL (ref 150–400)
RBC: 3.86 MIL/uL — ABNORMAL LOW (ref 4.22–5.81)
RDW: 13.6 % (ref 11.5–15.5)
WBC Count: 3.7 10*3/uL — ABNORMAL LOW (ref 4.0–10.5)
nRBC: 0 % (ref 0.0–0.2)

## 2020-09-17 LAB — LACTATE DEHYDROGENASE: LDH: 216 U/L — ABNORMAL HIGH (ref 98–192)

## 2020-09-17 MED ORDER — ACETAMINOPHEN 325 MG PO TABS
ORAL_TABLET | ORAL | Status: AC
  Filled 2020-09-17: qty 2

## 2020-09-17 MED ORDER — SODIUM CHLORIDE 0.9 % IV SOLN
375.0000 mg/m2 | Freq: Once | INTRAVENOUS | Status: AC
  Administered 2020-09-17: 800 mg via INTRAVENOUS
  Filled 2020-09-17: qty 30

## 2020-09-17 MED ORDER — HEPARIN SOD (PORK) LOCK FLUSH 100 UNIT/ML IV SOLN
500.0000 [IU] | Freq: Once | INTRAVENOUS | Status: AC | PRN
  Administered 2020-09-17: 500 [IU]
  Filled 2020-09-17: qty 5

## 2020-09-17 MED ORDER — PALONOSETRON HCL INJECTION 0.25 MG/5ML
INTRAVENOUS | Status: AC
  Filled 2020-09-17: qty 5

## 2020-09-17 MED ORDER — DIPHENHYDRAMINE HCL 25 MG PO CAPS
ORAL_CAPSULE | ORAL | Status: AC
  Filled 2020-09-17: qty 1

## 2020-09-17 MED ORDER — ACETAMINOPHEN 325 MG PO TABS
650.0000 mg | ORAL_TABLET | Freq: Once | ORAL | Status: AC
  Administered 2020-09-17: 650 mg via ORAL

## 2020-09-17 MED ORDER — DIPHENHYDRAMINE HCL 25 MG PO CAPS
50.0000 mg | ORAL_CAPSULE | Freq: Once | ORAL | Status: AC
  Administered 2020-09-17: 50 mg via ORAL

## 2020-09-17 MED ORDER — SODIUM CHLORIDE 0.9 % IV SOLN
90.0000 mg/m2 | Freq: Once | INTRAVENOUS | Status: AC
  Administered 2020-09-17: 200 mg via INTRAVENOUS
  Filled 2020-09-17: qty 8

## 2020-09-17 MED ORDER — SODIUM CHLORIDE 0.9 % IV SOLN
10.0000 mg | Freq: Once | INTRAVENOUS | Status: AC
  Administered 2020-09-17: 10 mg via INTRAVENOUS
  Filled 2020-09-17: qty 10

## 2020-09-17 MED ORDER — PALONOSETRON HCL INJECTION 0.25 MG/5ML
0.2500 mg | Freq: Once | INTRAVENOUS | Status: AC
  Administered 2020-09-17: 0.25 mg via INTRAVENOUS

## 2020-09-17 MED ORDER — SODIUM CHLORIDE 0.9 % IV SOLN
Freq: Once | INTRAVENOUS | Status: AC
  Filled 2020-09-17: qty 250

## 2020-09-17 MED ORDER — SODIUM CHLORIDE 0.9% FLUSH
10.0000 mL | INTRAVENOUS | Status: DC | PRN
  Administered 2020-09-17: 6 mL
  Filled 2020-09-17: qty 10

## 2020-09-17 NOTE — Progress Notes (Signed)
[facility] [phone]   [phone]  PROGRESS NOTE  Patient Care Team: Tysinger, Leward Quan as PCP - General (Family Medicine)  Hematological/Oncological History # Follicular Lymphoma. Stage III 1) 01/07/2020: CT Neck W contrast showed cervical lymphadenopathy bilaterally, left greater than right. Numerous enlarged lymph nodes are present 2)  01/22/2020: [place] Core biopsy performed which revealed overall features consistent with involvement by non-Hodgkin's B-cell lymphoma and the morphologic and phenotypic findings favor follicular lymphoma.  3) 01/31/2020: establish care with Dr. Lorenso Courier  4) 02/12/2020: PET CT scan demonstrates bulky adenopathy in the neck, chest, abdomen and pelvis and diffuse skeletal uptake is suspicious for (but not diagnostic) of marrow involvement. 5) 04/30/2020: Cycle 1 Day 1 of Bendamustine/Rituximab 6) 05/29/2020: Cycle 2 Day 1 of Bendamustine/Rituximab 7) 06/25/2020: Cycle 3 Day 1 of Bendamustine/Rituximab 8) 07/23/2020: Cycle 4 Day 1 of Bendamustine/Rituximab 9) 08/25/2020: Cycle 5 Day 1 of Bendamustine/Rituximab 10)09/17/2020: Cycle 6 Day 1 of Bendamustine/Rituximab  Interval History:  HARACE MCCLUNEY 64 y.o. male with medical history significant for Stage III follicular lymphoma who presents for a follow up visit. The patient's last visit was on 08/25/2020 at which time he started Cycle 5 Day 1 of Bendamustine/Rituximab. In the interim since the last visit Mr. Caracci has completed Cycle 5 of therapy with stable counts and no ED visits/hospitailzations.   On exam today Mr. Dillinger reports he has tolerated chemotherapy well overall with no side effects.  He notes that he works on the same day he receives treatment and has not had any limitations.  During his prior cycle he did have an episode of diarrhea which he believed was due to a beverage that he drank that was not quite right.  Throughout the course he has gained approximately 5  pounds.  He notes that his energy level is good and has had no issues with nausea, vomiting, or diarrhea.  Overall he is willing and able to proceed with treatment and has no questions concerns or complaints today.  A full 10 point ROS is listed below.  MEDICAL HISTORY:  Past Medical History:  Diagnosis Date  . Obesity     SURGICAL HISTORY: Past Surgical History:  Procedure Laterality Date  . ANKLE FRACTURE SURGERY  2016   right, trauma repair after fall down stairs  . HERNIA REPAIR     umbilical  . IR IMAGING GUIDED PORT INSERTION  04/09/2020  . TONSILLECTOMY      SOCIAL HISTORY: Social History   Socioeconomic History  . Marital status: Married    Spouse name: Not on file  . Number of children: Not on file  . Years of education: Not on file  . Highest education level: Not on file  Occupational History  . Not on file  Tobacco Use  . Smoking status: Never Smoker  . Smokeless tobacco: Never Used  Vaping Use  . Vaping Use: Never used  Substance and Sexual Activity  . Alcohol use: Yes    Alcohol/week: 3.0 standard drinks    Types: 3 Shots of liquor per week  . Drug use: Not Currently  . Sexual activity: Not on file  Other Topics Concern  . Not on file  Social History Narrative   Engaged, plan to marry in March 2021, been together since 2014.  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   Most exercise on the job, working 7 days per week.   No children, but fiance has 5 children.  11/2019   Social Determinants of Health   Financial Resource Strain:   . Difficulty of Paying Living Expenses: Not on file  Food Insecurity:   . Worried About Charity fundraiser in the Last Year: Not on file  . Ran Out of Food in the Last Year: Not on file  Transportation Needs:   . Lack of Transportation (Medical): Not on file  . Lack of Transportation (Non-Medical): Not on file  Physical Activity:   . Days of Exercise per Week: Not on file  . Minutes of Exercise per  Session: Not on file  Stress:   . Feeling of Stress : Not on file  Social Connections:   . Frequency of Communication with Friends and Family: Not on file  . Frequency of Social Gatherings with Friends and Family: Not on file  . Attends Religious Services: Not on file  . Active Member of Clubs or Organizations: Not on file  . Attends Archivist Meetings: Not on file  . Marital Status: Not on file  Intimate Partner Violence:   . Fear of Current or Ex-Partner: Not on file  . Emotionally Abused: Not on file  . Physically Abused: Not on file  . Sexually Abused: Not on file    FAMILY HISTORY: Family History  Problem Relation Age of Onset  . Cancer Mother        lung; former smoker  . COPD Father        emphysema  . Diabetes Maternal Aunt   . Heart disease Maternal Uncle   . Heart disease Maternal Uncle   . Diabetes Maternal Aunt   . Stroke Neg Hx   . Hyperlipidemia Neg Hx   . Hypertension Neg Hx     ALLERGIES:  has No Known Allergies.  MEDICATIONS:  Current Outpatient Medications  Medication Sig Dispense Refill  . acyclovir (ZOVIRAX) 400 MG tablet Take 1 tablet (400 mg total) by mouth daily. 30 tablet 3  . allopurinol (ZYLOPRIM) 300 MG tablet TAKE 1 TABLET(300 MG) BY MOUTH DAILY 30 tablet 3  . dexamethasone (DECADRON) 4 MG tablet Take 2 tablets (8 mg total) by mouth daily. Start the day after bendamustine chemotherapy for 2 days. Take with food. 30 tablet 1  . lidocaine-prilocaine (EMLA) cream Apply to affected area once 30 g 3  . Naphazoline HCl (CLEAR EYES OP) Place 1 drop into both eyes daily as needed (redness). (Patient not taking: Reported on 05/28/2020)    . ondansetron (ZOFRAN) 8 MG tablet Take 1 tablet (8 mg total) by mouth 2 (two) times daily as needed for refractory nausea / vomiting. Start on day 2 after bendamustine chemo. 30 tablet 1  . prochlorperazine (COMPAZINE) 10 MG tablet Take 1 tablet (10 mg total) by mouth every 6 (six) hours as needed (Nausea or  vomiting). 30 tablet 1  . sildenafil (VIAGRA) 100 MG tablet Take 0.5-1 tablets (50-100 mg total) by mouth daily as needed for erectile dysfunction. 10 tablet 0   No current facility-administered medications for this visit.    REVIEW OF SYSTEMS:   Constitutional: ( - ) fevers, ( - )  chills , ( - ) night sweats Eyes: ( - ) blurriness of vision, ( - ) double vision, ( - ) watery eyes Ears, nose, mouth, throat, and face: ( - ) mucositis, ( - ) sore throat Respiratory: ( - ) cough, ( - ) dyspnea, ( - ) wheezes Cardiovascular: ( - ) palpitation, ( - ) chest discomfort, ( - )  lower extremity swelling Gastrointestinal:  ( - ) nausea, ( - ) heartburn, ( - ) change in bowel habits Skin: ( - ) abnormal skin rashes Lymphatics: ( - ) new lymphadenopathy, ( - ) easy bruising Neurological: ( - ) numbness, ( - ) tingling, ( - ) new weaknesses Behavioral/Psych: ( - ) mood change, ( - ) new changes  All other systems were reviewed with the patient and are negative.  PHYSICAL EXAMINATION: ECOG PERFORMANCE STATUS: 1 - Symptomatic but completely ambulatory  There were no vitals filed for this visit. There were no vitals filed for this visit.  GENERAL: well appearing middle aged Caucasian male in NAD  SKIN: skin color, texture, turgor are normal, no rashes or significant lesions EYES: conjunctiva are pink and non-injected, sclera clear NECK: supple, non-tender LYMPH:  no palpable lymphadenopathy in the cervical chain LUNGS: clear to auscultation and percussion with normal breathing effort HEART: regular rate & rhythm and no murmurs and no lower extremity edema Musculoskeletal: no cyanosis of digits and no clubbing  PSYCH: alert & oriented x 3, fluent speech NEURO: no focal motor/sensory deficits  LABORATORY DATA:  I have reviewed the data as listed CBC Latest Ref Rng & Units 08/25/2020 07/23/2020 06/25/2020  WBC 4.0 - 10.5 K/uL 4.9 3.5(L) 2.9(L)  Hemoglobin 13.0 - 17.0 g/dL 12.4(L) 12.9(L) 12.2(L)   Hematocrit 39 - 52 % 35.5(L) 37.1(L) 35.9(L)  Platelets 150 - 400 K/uL 160 191 212    CMP Latest Ref Rng & Units 08/25/2020 07/23/2020 06/25/2020  Glucose 70 - 99 mg/dL 89 109(H) 89  BUN 8 - 23 mg/dL 18 24(H) 18  Creatinine 0.61 - 1.24 mg/dL 0.85 1.01 0.91  Sodium 135 - 145 mmol/L 141 141 138  Potassium 3.5 - 5.1 mmol/L 4.1 3.9 4.0  Chloride 98 - 111 mmol/L 106 107 104  CO2 22 - 32 mmol/L 28 25 25   Calcium 8.9 - 10.3 mg/dL 9.5 9.2 9.5  Total Protein 6.5 - 8.1 g/dL 6.9 7.1 7.0  Total Bilirubin 0.3 - 1.2 mg/dL 0.4 0.4 0.4  Alkaline Phos 38 - 126 U/L 103 109 115  AST 15 - 41 U/L 14(L) 14(L) 14(L)  ALT 0 - 44 U/L 13 13 10    RADIOGRAPHIC STUDIES: I have personally reviewed the radiological images as listed and agreed with the findings in the report: lymphadenopathy in the neck, chest, and abdomen, with most pronounced nodes in the abdomen. Bone also has more FDG avidity than would be expected.   No results found.  ASSESSMENT & PLAN KADEEN SROKA 64 y.o. male with medical history significant for newly diagnosed follicular lymphoma who presents for a follow up visit. Today is Cycle 5 Day 1 of Bendamustine/Rituximab therapy.   On exam today Mr. Wentz is at his baseline level of health.  He has been asymptomatic throughout the course of his chemotherapy.  He has no question concerns or complaints today.Marland Kitchen  He is willing and able to proceed with cycle 6 at this time.  FLIPI-2 Score: 4  (high risk). Indication for treatment: bulky lymphadenopathy with 15 x 9 cm conglomerate of lymph nodes in the abdomen.  The regimen of choice for this patient is Bendamustine/Rituximab. Treatment will consist of 6 cycles, each lasting 28 days. The patient will receive Bendamustine 90mg /m2 IV on Day 1 and Day 2 in addition to Rituximab 375mg /m2 IV on Day 1. This is being administered with curative intent. Administered x 6 cycles.   # Follicular Lymphoma. Stage III --today is  Cycle 6 Day 1 of R-Benda therapy.  This is being administered with curative intent. OK to proceed based on clinic exam and labs.  --no clear indication for imaging at this time as the patient is tolerating treatment well with no adverse effects or signs concerning for progression.  --supportive treatments as noted below.  --RTC in 6 weeks after end of treatment PET CT scan. Given stability of labs no need for interval lab checks.   #Fertility Preservation  --referred patient to [facility] for Fertility Clinic and Sperm preservation.  --unfortunately patient was noted to be sterile on exam, no sperm to freeze.   #Supportive Care --provided patient with zofran 8mg  PO q8H PRN and compazine 10mg  PO q6H PRN for antiemetic therapy. He has not required this.  --acyclovir 300mg  PO daily for VZV prophyalxis --continue allopurinol 300mg  PO daily as TLS prophylaxis.  --do not anticipate need for GCSF or transfusion support  No orders of the defined types were placed in this encounter.  All questions were answered. The patient knows to call the clinic with any problems, questions or concerns.  A total of more than 30 minutes were spent on this encounter and over half of that time was spent on counseling and coordination of care as outlined above.   Ledell Peoples, MD Department of Hematology/Oncology [facility] Phone: [phone] Pager: [phone] Email: [email or web address]  09/17/2020 9:04 AM

## 2020-09-17 NOTE — Patient Instructions (Signed)
[facility] Discharge Instructions for Patients Receiving Chemotherapy  Today you received the following chemotherapy agents: Rituximab and Benedka  To help prevent nausea and vomiting after your treatment, we encourage you to take your nausea medication as prescribed.    If you develop nausea and vomiting that is not controlled by your nausea medication, call the clinic.   BELOW ARE SYMPTOMS THAT SHOULD BE REPORTED IMMEDIATELY:  *FEVER GREATER THAN 100.5 F  *CHILLS WITH OR WITHOUT FEVER  NAUSEA AND VOMITING THAT IS NOT CONTROLLED WITH YOUR NAUSEA MEDICATION  *UNUSUAL SHORTNESS OF BREATH  *UNUSUAL BRUISING OR BLEEDING  TENDERNESS IN MOUTH AND THROAT WITH OR WITHOUT PRESENCE OF ULCERS  *URINARY PROBLEMS  *BOWEL PROBLEMS  UNUSUAL RASH Items with * indicate a potential emergency and should be followed up as soon as possible.  Feel free to call the clinic should you have any questions or concerns. The clinic phone number is [phone].  Please show the [place] at check-in to the Emergency Department and triage nurse.

## 2020-09-18 ENCOUNTER — Inpatient Hospital Stay: Payer: Medicare Other

## 2020-09-18 ENCOUNTER — Telehealth: Payer: Self-pay | Admitting: Hematology and Oncology

## 2020-09-18 ENCOUNTER — Other Ambulatory Visit: Payer: Self-pay

## 2020-09-18 VITALS — BP 146/74 | HR 55 | Temp 97.8°F | Resp 16

## 2020-09-18 DIAGNOSIS — C8290 Follicular lymphoma, unspecified, unspecified site: Secondary | ICD-10-CM | POA: Diagnosis not present

## 2020-09-18 DIAGNOSIS — Z23 Encounter for immunization: Secondary | ICD-10-CM

## 2020-09-18 DIAGNOSIS — Z79899 Other long term (current) drug therapy: Secondary | ICD-10-CM | POA: Diagnosis not present

## 2020-09-18 DIAGNOSIS — Z5111 Encounter for antineoplastic chemotherapy: Secondary | ICD-10-CM | POA: Diagnosis not present

## 2020-09-18 MED ORDER — SODIUM CHLORIDE 0.9 % IV SOLN
90.0000 mg/m2 | Freq: Once | INTRAVENOUS | Status: AC
  Administered 2020-09-18: 200 mg via INTRAVENOUS
  Filled 2020-09-18: qty 8

## 2020-09-18 MED ORDER — HEPARIN SOD (PORK) LOCK FLUSH 100 UNIT/ML IV SOLN
500.0000 [IU] | Freq: Once | INTRAVENOUS | Status: AC | PRN
  Administered 2020-09-18: 500 [IU]
  Filled 2020-09-18: qty 5

## 2020-09-18 MED ORDER — SODIUM CHLORIDE 0.9 % IV SOLN
10.0000 mg | Freq: Once | INTRAVENOUS | Status: AC
  Administered 2020-09-18: 10 mg via INTRAVENOUS
  Filled 2020-09-18: qty 10

## 2020-09-18 MED ORDER — SODIUM CHLORIDE 0.9% FLUSH
10.0000 mL | INTRAVENOUS | Status: DC | PRN
  Administered 2020-09-18: 10 mL
  Filled 2020-09-18: qty 10

## 2020-09-18 MED ORDER — SODIUM CHLORIDE 0.9 % IV SOLN
Freq: Once | INTRAVENOUS | Status: AC
  Filled 2020-09-18: qty 250

## 2020-09-18 NOTE — Progress Notes (Signed)
   Covid-19 Vaccination Clinic  Name:  Jorge Gould    MRN: [id number] DOB: 09/12/1956  09/18/2020  [name] was observed post Covid-19 immunization for 15 minutes without incident. He was provided with Vaccine Information Sheet and instruction to access the V-Safe system.   [name] was instructed to call 911 with any severe reactions post vaccine: Marland Kitchen Difficulty breathing  . Swelling of face and throat  . A fast heartbeat  . A bad rash all over body  . Dizziness and weakness

## 2020-09-18 NOTE — Telephone Encounter (Signed)
Scheduled per los. Called and spoke with patient. Confirmed appt 

## 2020-09-18 NOTE — Patient Instructions (Signed)
[facility] Discharge Instructions for Patients Receiving Chemotherapy  Today you received the following chemotherapy agent: Bendeka  To help prevent nausea and vomiting after your treatment, we encourage you to take your nausea medication as directed by your MD.   If you develop nausea and vomiting that is not controlled by your nausea medication, call the clinic.   BELOW ARE SYMPTOMS THAT SHOULD BE REPORTED IMMEDIATELY:  *FEVER GREATER THAN 100.5 F  *CHILLS WITH OR WITHOUT FEVER  NAUSEA AND VOMITING THAT IS NOT CONTROLLED WITH YOUR NAUSEA MEDICATION  *UNUSUAL SHORTNESS OF BREATH  *UNUSUAL BRUISING OR BLEEDING  TENDERNESS IN MOUTH AND THROAT WITH OR WITHOUT PRESENCE OF ULCERS  *URINARY PROBLEMS  *BOWEL PROBLEMS  UNUSUAL RASH Items with * indicate a potential emergency and should be followed up as soon as possible.  Feel free to call the clinic should you have any questions or concerns. The clinic phone number is [phone].  Please show the [facility] at check-in to the Emergency Department and triage nurse.

## 2020-09-21 ENCOUNTER — Encounter: Payer: Self-pay | Admitting: Hematology and Oncology

## 2020-09-22 ENCOUNTER — Telehealth: Payer: Self-pay | Admitting: Medical

## 2020-09-22 NOTE — Telephone Encounter (Signed)
Called pt and he said he doesn't have a monitor to keep track of his bp. He stated that he works full time 7 days a week and he feels like that's why its high. Pt advised to call the office soon to schedule appt. especially if he starts feeling bad.

## 2020-09-22 NOTE — Telephone Encounter (Signed)
Please call patient.  I get copied on the oncology notes.  His blood pressures have been elevated the last few notes from oncology.  I am not sure if he has a way to check his blood pressures, but if possible I would recommend he monitor some of his blood pressure readings outside of the medical office.  Normal is 120/70.  High blood pressure is 140/90 or higher.  If he continues to have elevated readings we may need to see him back and discuss

## 2020-10-08 ENCOUNTER — Telehealth: Payer: Self-pay | Admitting: Hematology and Oncology

## 2020-10-08 NOTE — Telephone Encounter (Signed)
Per provider request, moved 12/22 appt to 12/15. Called and spoke with patient. Confirmed appt

## 2020-10-15 ENCOUNTER — Telehealth: Payer: Self-pay | Admitting: Hematology and Oncology

## 2020-10-15 NOTE — Telephone Encounter (Signed)
Scheduled appt per 12/8 sch msg - pt is aware of appt date and time

## 2020-10-17 ENCOUNTER — Ambulatory Visit (HOSPITAL_COMMUNITY): Payer: Medicare Other

## 2020-10-22 ENCOUNTER — Other Ambulatory Visit: Payer: Medicare Other

## 2020-10-22 ENCOUNTER — Ambulatory Visit: Payer: Medicare Other | Admitting: Hematology and Oncology

## 2020-10-24 ENCOUNTER — Other Ambulatory Visit: Payer: Self-pay

## 2020-10-24 ENCOUNTER — Ambulatory Visit (HOSPITAL_COMMUNITY)
Admission: RE | Admit: 2020-10-24 | Discharge: 2020-10-24 | Disposition: A | Discharge status: Home / Self Care | Payer: Medicare Other | Source: Ambulatory Visit | Attending: Hematology and Oncology | Admitting: Hematology and Oncology

## 2020-10-24 DIAGNOSIS — C8290 Follicular lymphoma, unspecified, unspecified site: Secondary | ICD-10-CM | POA: Diagnosis not present

## 2020-10-24 DIAGNOSIS — I700 Atherosclerosis of aorta: Secondary | ICD-10-CM | POA: Diagnosis not present

## 2020-10-24 DIAGNOSIS — I77810 Thoracic aortic ectasia: Secondary | ICD-10-CM | POA: Diagnosis not present

## 2020-10-24 DIAGNOSIS — C8233 Follicular lymphoma grade IIIa, intra-abdominal lymph nodes: Secondary | ICD-10-CM | POA: Insufficient documentation

## 2020-10-24 DIAGNOSIS — J439 Emphysema, unspecified: Secondary | ICD-10-CM | POA: Diagnosis not present

## 2020-10-24 DIAGNOSIS — I2510 Atherosclerotic heart disease of native coronary artery without angina pectoris: Secondary | ICD-10-CM | POA: Diagnosis not present

## 2020-10-24 DIAGNOSIS — J432 Centrilobular emphysema: Secondary | ICD-10-CM | POA: Diagnosis not present

## 2020-10-24 LAB — GLUCOSE, CAPILLARY: Glucose-Capillary: 97 mg/dL (ref 70–99)

## 2020-10-24 MED ORDER — FLUDEOXYGLUCOSE F - 18 (FDG) INJECTION
11.6900 | Freq: Once | INTRAVENOUS | Status: AC | PRN
  Administered 2020-10-24: 12:00:00 11.69 via INTRAVENOUS

## 2020-10-28 ENCOUNTER — Other Ambulatory Visit: Payer: Self-pay | Admitting: Hematology and Oncology

## 2020-10-28 DIAGNOSIS — C8233 Follicular lymphoma grade IIIa, intra-abdominal lymph nodes: Secondary | ICD-10-CM

## 2020-10-28 NOTE — Progress Notes (Signed)
[place] [phone]   [phone]  PROGRESS NOTE  Patient Care Team: Tysinger, Leward Quan as PCP - General (Family Medicine)  Hematological/Oncological History # Follicular Lymphoma. Stage III 1) 01/07/2020: CT Neck W contrast showed cervical lymphadenopathy bilaterally, left greater than right. Numerous enlarged lymph nodes are present 2)  01/22/2020: [place] Core biopsy performed which revealed overall features consistent with involvement by non-Hodgkin's B-cell lymphoma and the morphologic and phenotypic findings favor follicular lymphoma.  3) 01/31/2020: establish care with Dr. Lorenso Courier  4) 02/12/2020: PET CT scan demonstrates bulky adenopathy in the neck, chest, abdomen and pelvis and diffuse skeletal uptake is suspicious for (but not diagnostic) of marrow involvement. 5) 04/30/2020: Cycle 1 Day 1 of Bendamustine/Rituximab 6) 05/29/2020: Cycle 2 Day 1 of Bendamustine/Rituximab 7) 06/25/2020: Cycle 3 Day 1 of Bendamustine/Rituximab 8) 07/23/2020: Cycle 4 Day 1 of Bendamustine/Rituximab 9) 08/25/2020: Cycle 5 Day 1 of Bendamustine/Rituximab 10)09/17/2020: Cycle 6 Day 1 of Bendamustine/Rituximab 11) 10/26/2020: PET CT scan shows partial response (Deauville Score 3 in lymph nodes but some residual FDG avidity in the bone marrow).   Interval History:  Jorge Gould 64 y.o. male with medical history significant for Stage III follicular lymphoma who presents for a follow up visit. The patient's last visit was on 09/17/2020 at which time he started Cycle 6 Day 1 of Bendamustine/Rituximab. In the interim since the last visit Jorge Gould has completed Cycle 6 of therapy with stable counts and no ED visits/hospitailzations.   On exam today Jorge Gould reports that he has been well in the interim since our last visit.  He tolerated the last cycle of chemotherapy well without any symptoms.  His energy level has been good and he feels like he is at his baseline level of  health.  He continues to work hard and has no limitations in his physical activity.  He denies any fevers, chills, sweats, nausea, vomiting or diarrhea.  A full 10 point ROS is listed below.  MEDICAL HISTORY:  Past Medical History:  Diagnosis Date   Obesity     SURGICAL HISTORY: Past Surgical History:  Procedure Laterality Date   ANKLE FRACTURE SURGERY  2016   right, trauma repair after fall down stairs   HERNIA REPAIR     umbilical   IR IMAGING GUIDED PORT INSERTION  04/09/2020   TONSILLECTOMY      SOCIAL HISTORY: Social History   Socioeconomic History   Marital status: Married    Spouse name: Not on file   Number of children: Not on file   Years of education: Not on file   Highest education level: Not on file  Occupational History   Not on file  Tobacco Use   Smoking status: Never Smoker   Smokeless tobacco: Never Used  Vaping Use   Vaping Use: Never used  Substance and Sexual Activity   Alcohol use: Yes    Alcohol/week: 3.0 standard drinks    Types: 3 Shots of liquor per week   Drug use: Not Currently   Sexual activity: Not on file  Other Topics Concern   Not on file  Social History Narrative   Engaged, plan to marry in March 2021, been together since 2014.  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   Most exercise on the job, working 7 days per week.   No children, but fiance has 5 children.    11/2019   Social Determinants of Health   Financial Resource Strain:  Not on file  Food Insecurity: Not on file  Transportation Needs: Not on file  Physical Activity: Not on file  Stress: Not on file  Social Connections: Not on file  Intimate Partner Violence: Not on file    FAMILY HISTORY: Family History  Problem Relation Age of Onset   Cancer Mother        lung; former smoker   COPD Father        emphysema   Diabetes Maternal Aunt    Heart disease Maternal Uncle    Heart disease Maternal Uncle    Diabetes Maternal Aunt     Stroke Neg Hx    Hyperlipidemia Neg Hx    Hypertension Neg Hx     ALLERGIES:  has No Known Allergies.  MEDICATIONS:  Current Outpatient Medications  Medication Sig Dispense Refill   acyclovir (ZOVIRAX) 400 MG tablet Take 1 tablet (400 mg total) by mouth daily. 30 tablet 3   allopurinol (ZYLOPRIM) 300 MG tablet TAKE 1 TABLET(300 MG) BY MOUTH DAILY 30 tablet 3   dexamethasone (DECADRON) 4 MG tablet Take 2 tablets (8 mg total) by mouth daily. Start the day after bendamustine chemotherapy for 2 days. Take with food. 30 tablet 1   lidocaine-prilocaine (EMLA) cream Apply to affected area once 30 g 3   Naphazoline HCl (CLEAR EYES OP) Place 1 drop into both eyes daily as needed (redness). (Patient not taking: Reported on 05/28/2020)     ondansetron (ZOFRAN) 8 MG tablet Take 1 tablet (8 mg total) by mouth 2 (two) times daily as needed for refractory nausea / vomiting. Start on day 2 after bendamustine chemo. 30 tablet 1   prochlorperazine (COMPAZINE) 10 MG tablet Take 1 tablet (10 mg total) by mouth every 6 (six) hours as needed (Nausea or vomiting). 30 tablet 1   sildenafil (VIAGRA) 100 MG tablet Take 0.5-1 tablets (50-100 mg total) by mouth daily as needed for erectile dysfunction. 10 tablet 0   No current facility-administered medications for this visit.    REVIEW OF SYSTEMS:   Constitutional: ( - ) fevers, ( - )  chills , ( - ) night sweats Eyes: ( - ) blurriness of vision, ( - ) double vision, ( - ) watery eyes Ears, nose, mouth, throat, and face: ( - ) mucositis, ( - ) sore throat Respiratory: ( - ) cough, ( - ) dyspnea, ( - ) wheezes Cardiovascular: ( - ) palpitation, ( - ) chest discomfort, ( - ) lower extremity swelling Gastrointestinal:  ( - ) nausea, ( - ) heartburn, ( - ) change in bowel habits Skin: ( - ) abnormal skin rashes Lymphatics: ( - ) new lymphadenopathy, ( - ) easy bruising Neurological: ( - ) numbness, ( - ) tingling, ( - ) new weaknesses Behavioral/Psych:  ( - ) mood change, ( - ) new changes  All other systems were reviewed with the patient and are negative.  PHYSICAL EXAMINATION: ECOG PERFORMANCE STATUS: 1 - Symptomatic but completely ambulatory  Vitals:   10/29/20 0823  BP: 120/81  Pulse: 60  Resp: 18  Temp: (!) 97.2 F (36.2 C)  SpO2: 98%   Filed Weights   10/29/20 0823  Weight: 233 lb 12.8 oz (106.1 kg)    GENERAL: well appearing middle aged Caucasian male in NAD  SKIN: skin color, texture, turgor are normal, no rashes or significant lesions EYES: conjunctiva are pink and non-injected, sclera clear NECK: supple, non-tender LYMPH:  no palpable lymphadenopathy in the cervical chain  LUNGS: clear to auscultation and percussion with normal breathing effort HEART: regular rate & rhythm and no murmurs and no lower extremity edema Musculoskeletal: no cyanosis of digits and no clubbing  PSYCH: alert & oriented x 3, fluent speech NEURO: no focal motor/sensory deficits  LABORATORY DATA:  I have reviewed the data as listed CBC Latest Ref Rng & Units 10/29/2020 09/17/2020 08/25/2020  WBC 4.0 - 10.5 K/uL 4.7 3.7(L) 4.9  Hemoglobin 13.0 - 17.0 g/dL 13.5 11.9(L) 12.4(L)  Hematocrit 39.0 - 52.0 % 39.0 35.2(L) 35.5(L)  Platelets 150 - 400 K/uL 208 171 160    CMP Latest Ref Rng & Units 10/29/2020 09/17/2020 08/25/2020  Glucose 70 - 99 mg/dL 113(H) 92 89  BUN 8 - 23 mg/dL 24(H) 17 18  Creatinine 0.61 - 1.24 mg/dL 1.18 0.96 0.85  Sodium 135 - 145 mmol/L 143 139 141  Potassium 3.5 - 5.1 mmol/L 4.3 3.8 4.1  Chloride 98 - 111 mmol/L 105 107 106  CO2 22 - 32 mmol/L 28 26 28   Calcium 8.9 - 10.3 mg/dL 9.7 9.1 9.5  Total Protein 6.5 - 8.1 g/dL 7.1 6.8 6.9  Total Bilirubin 0.3 - 1.2 mg/dL 0.6 0.7 0.4  Alkaline Phos 38 - 126 U/L 95 91 103  AST 15 - 41 U/L 14(L) 15 14(L)  ALT 0 - 44 U/L 16 13 13    RADIOGRAPHIC STUDIES: I have personally reviewed the radiological images as listed and agreed with the findings in the report: Marked response  in the patient's lymphadenopathy and decrease in his FDG avidity.  Bone marrow involvement also appears considerably less.  Overall consistent with a partial response.  NM PET Image Restag (PS) Skull Base To Thigh  Result Date: 10/26/2020 CLINICAL DATA:  Subsequent treatment strategy for follicular lymphoma. EXAM: NUCLEAR MEDICINE PET SKULL BASE TO THIGH TECHNIQUE: 11.7 mCi F-18 FDG was injected intravenously. Full-ring PET imaging was performed from the skull base to thigh after the radiotracer. CT data was obtained and used for attenuation correction and anatomic localization. Fasting blood glucose: 97 mg/dl COMPARISON:  02/12/2020 PET-CT. FINDINGS: Mediastinal blood pool activity: SUV max 2.9 Liver activity: SUV max 3.8 NECK: No residual or new enlarged or hypermetabolic lymph nodes in the neck. Previously visualized hypermetabolic bilateral parotid nodules have resolved. Incidental CT findings: Mucoperiosteal thickening in the right maxillary sinus without fluid level, unchanged. Right internal jugular Port-A-Cath terminates at the cavoatrial junction. CHEST: No residual or new enlarged or hypermetabolic axillary, mediastinal or hilar lymph nodes. No hypermetabolic pulmonary findings. Incidental CT findings: Coronary atherosclerosis. Atherosclerotic thoracic aorta with ectatic 4.2 cm ascending thoracic aorta, stable. No significant pulmonary nodules. Mild centrilobular emphysema. ABDOMEN/PELVIS: Normal size spleen. Normal splenic metabolism below that of the liver. Confluent retroperitoneal adenopathy involving the left para-aortic and aortocaval chains measures 10.3 x 4.5 cm and demonstrates residual mild hypermetabolism with max SUV 4.0 (series 4/image 125), previously 15.1 x 9.1 cm with max SUV 13.1, substantially decreased in size and metabolism. Central mesenteric adenopathy measures 1.1 cm short axis diameter and demonstrates residual low level uptake with max SUV 2.4 (series 4/image 140), previously  2.3 cm with max SUV 6.7, significantly decreased in size and metabolism. Ill-defined confluent left common iliac chain adenopathy measures 2.2 cm short axis diameter with max SUV 4.3 (series 4/image 160), previously 3.8 cm with max SUV 12.1, significantly decreased in size and metabolism. No residual enlarged or hypermetabolic iliac lymph nodes. No newly enlarged or newly hypermetabolic abdominopelvic nodes. No abnormal hypermetabolic activity within the liver,  pancreas or adrenal glands. Incidental CT findings: Minimally atherosclerotic nonaneurysmal abdominal aorta. SKELETON: No focal hypermetabolic skeletal activity. Generalized patchy low level uptake throughout the axial skeleton is substantially decreased, for example max SUV 3.7 in the L1 vertebral body, previous max SUV 10.4. Incidental CT findings: none IMPRESSION: 1. Substantial partial response. Residual mild hypermetabolism within the retroperitoneal and left common iliac chains (Deauville score 3). No residual metabolically active adenopathy in the neck or chest. Low level patchy uptake throughout the skeleton, substantially decreased. Spleen is normal size and now non-hypermetabolic. 2. Aortic Atherosclerosis (ICD10-I70.0) and Emphysema (ICD10-J43.9). Electronically Signed   By: Ilona Sorrel M.D.   On: 10/26/2020 19:23    ASSESSMENT & PLAN Jorge Gould 64 y.o. male with medical history significant for newly diagnosed follicular lymphoma who presents for a follow up visit. He completed 6 cycles of Bendamustine/Rituximab therapy.   On exam today Jorge Gould reports that he feels well overall.  He tolerated his treatment well and has continued to work at his normal level, which is at baseline quite high.  We discussed the results of his PET CT scan and the need for continued surveillance.  I noted that it was a partial response and that at this time we can continue to observe and consider retreatment in the event that the lymphadenopathy were to  flare again.  The patient and spouse voiced their understanding of the plan.  FLIPI-2 Score: 4  (high risk). Initial indication for treatment: bulky lymphadenopathy with 15 x 9 cm conglomerate of lymph nodes in the abdomen.  The regimen of choice for this patient was Bendamustine/Rituximab. Treatment will consist of 6 cycles, each lasting 28 days. The patient will receive Bendamustine 90mg /m2 IV on Day 1 and Day 2 in addition to Rituximab 375mg /m2 IV on Day 1. This was administered with curative intent. Administered x 6 cycles. Repeat PET CT scan on 10/26/2020 showed a partial response. OK to enter observation.   # Follicular Lymphoma. Stage III --patient completed Cycle 6 of R-Benda therapy. This was administered with curative intent.  --PET CT scan after 6 cycles of R-Benda showed substantial partial response. Residual mild hypermetabolism within the retroperitoneal and left common iliac chains (Deauville score 3). No residual metabolically active adenopathy in the neck or chest --per [place] Criteria, patient is a Partial Response.  --based on NCCN recommendations, patient is appropriate for observation with q 6 month CT scans x 2 years and q 3-6 month f/u clinic visits.  --RTC in 3 months for continued observation.  #Fertility Preservation  --referred patient to [facility] for Fertility Clinic and Sperm preservation.  --unfortunately patient was noted to be sterile on exam, no sperm to freeze.   #Supportive Care --no longer required. --port can be removed at this time.   No orders of the defined types were placed in this encounter.  All questions were answered. The patient knows to call the clinic with any problems, questions or concerns.  A total of more than 30 minutes were spent on this encounter and over half of that time was spent on counseling and coordination of care as outlined above.   Ledell Peoples, MD Department of Hematology/Oncology [place] at [facility] Phone: [phone] Pager: [phone] Email: [email or web address]  10/29/2020 12:17 PM

## 2020-10-29 ENCOUNTER — Other Ambulatory Visit: Payer: Self-pay

## 2020-10-29 ENCOUNTER — Other Ambulatory Visit: Payer: Medicare Other

## 2020-10-29 ENCOUNTER — Inpatient Hospital Stay: Payer: Medicare Other

## 2020-10-29 ENCOUNTER — Inpatient Hospital Stay: Payer: Medicare Other | Attending: Hematology and Oncology | Admitting: Hematology and Oncology

## 2020-10-29 ENCOUNTER — Ambulatory Visit: Payer: Medicare Other | Admitting: Hematology and Oncology

## 2020-10-29 VITALS — BP 120/81 | HR 60 | Temp 97.2°F | Resp 18 | Ht 71.5 in | Wt 233.8 lb

## 2020-10-29 DIAGNOSIS — Z9221 Personal history of antineoplastic chemotherapy: Secondary | ICD-10-CM | POA: Diagnosis not present

## 2020-10-29 DIAGNOSIS — R591 Generalized enlarged lymph nodes: Secondary | ICD-10-CM

## 2020-10-29 DIAGNOSIS — C8290 Follicular lymphoma, unspecified, unspecified site: Secondary | ICD-10-CM | POA: Insufficient documentation

## 2020-10-29 DIAGNOSIS — C8233 Follicular lymphoma grade IIIa, intra-abdominal lymph nodes: Secondary | ICD-10-CM

## 2020-10-29 DIAGNOSIS — Z79899 Other long term (current) drug therapy: Secondary | ICD-10-CM | POA: Insufficient documentation

## 2020-10-29 DIAGNOSIS — Z95828 Presence of other vascular implants and grafts: Secondary | ICD-10-CM | POA: Diagnosis not present

## 2020-10-29 LAB — CBC WITH DIFFERENTIAL (CANCER CENTER ONLY)
Abs Immature Granulocytes: 0.04 10*3/uL (ref 0.00–0.07)
Basophils Absolute: 0 10*3/uL (ref 0.0–0.1)
Basophils Relative: 0 %
Eosinophils Absolute: 0.2 10*3/uL (ref 0.0–0.5)
Eosinophils Relative: 4 %
HCT: 39 % (ref 39.0–52.0)
Hemoglobin: 13.5 g/dL (ref 13.0–17.0)
Immature Granulocytes: 1 %
Lymphocytes Relative: 6 %
Lymphs Abs: 0.3 10*3/uL — ABNORMAL LOW (ref 0.7–4.0)
MCH: 31.6 pg (ref 26.0–34.0)
MCHC: 34.6 g/dL (ref 30.0–36.0)
MCV: 91.3 fL (ref 80.0–100.0)
Monocytes Absolute: 0.4 10*3/uL (ref 0.1–1.0)
Monocytes Relative: 9 %
Neutro Abs: 3.7 10*3/uL (ref 1.7–7.7)
Neutrophils Relative %: 80 %
Platelet Count: 208 10*3/uL (ref 150–400)
RBC: 4.27 MIL/uL (ref 4.22–5.81)
RDW: 13.4 % (ref 11.5–15.5)
WBC Count: 4.7 10*3/uL (ref 4.0–10.5)
nRBC: 0 % (ref 0.0–0.2)

## 2020-10-29 LAB — CMP (CANCER CENTER ONLY)
ALT: 16 U/L (ref 0–44)
AST: 14 U/L — ABNORMAL LOW (ref 15–41)
Albumin: 4 g/dL (ref 3.5–5.0)
Alkaline Phosphatase: 95 U/L (ref 38–126)
Anion gap: 10 (ref 5–15)
BUN: 24 mg/dL — ABNORMAL HIGH (ref 8–23)
CO2: 28 mmol/L (ref 22–32)
Calcium: 9.7 mg/dL (ref 8.9–10.3)
Chloride: 105 mmol/L (ref 98–111)
Creatinine: 1.18 mg/dL (ref 0.61–1.24)
GFR, Estimated: >60 mL/min (ref >60)
Glucose, Bld: 113 mg/dL — ABNORMAL HIGH (ref 70–99)
Potassium: 4.3 mmol/L (ref 3.5–5.1)
Sodium: 143 mmol/L (ref 135–145)
Total Bilirubin: 0.6 mg/dL (ref 0.3–1.2)
Total Protein: 7.1 g/dL (ref 6.5–8.1)

## 2020-10-29 LAB — LACTATE DEHYDROGENASE: LDH: 180 U/L (ref 98–192)

## 2020-11-03 ENCOUNTER — Telehealth: Payer: Self-pay | Admitting: Hematology and Oncology

## 2020-11-03 NOTE — Telephone Encounter (Signed)
Scheduled per 12/22 los. Called pt no answer and unable to leave a msg. Mailing appt letter and calendar print out

## 2020-11-25 ENCOUNTER — Other Ambulatory Visit: Payer: Self-pay | Admitting: Radiology

## 2020-11-26 ENCOUNTER — Other Ambulatory Visit: Payer: Self-pay | Admitting: Radiology

## 2020-11-27 ENCOUNTER — Encounter (HOSPITAL_COMMUNITY): Payer: Self-pay

## 2020-11-27 ENCOUNTER — Ambulatory Visit (HOSPITAL_COMMUNITY)
Admission: RE | Admit: 2020-11-27 | Discharge: 2020-11-27 | Disposition: A | Discharge status: Home / Self Care | Payer: Medicare Other | Source: Ambulatory Visit | Attending: Hematology and Oncology | Admitting: Hematology and Oncology

## 2020-11-27 ENCOUNTER — Other Ambulatory Visit: Payer: Self-pay

## 2020-11-27 DIAGNOSIS — Z8572 Personal history of non-Hodgkin lymphomas: Secondary | ICD-10-CM | POA: Diagnosis not present

## 2020-11-27 DIAGNOSIS — Z452 Encounter for adjustment and management of vascular access device: Secondary | ICD-10-CM | POA: Diagnosis not present

## 2020-11-27 DIAGNOSIS — Z95828 Presence of other vascular implants and grafts: Secondary | ICD-10-CM

## 2020-11-27 DIAGNOSIS — C8290 Follicular lymphoma, unspecified, unspecified site: Secondary | ICD-10-CM | POA: Diagnosis not present

## 2020-11-27 DIAGNOSIS — Z79899 Other long term (current) drug therapy: Secondary | ICD-10-CM | POA: Diagnosis not present

## 2020-11-27 DIAGNOSIS — C8233 Follicular lymphoma grade IIIa, intra-abdominal lymph nodes: Secondary | ICD-10-CM

## 2020-11-27 HISTORY — PX: IR REMOVAL TUN ACCESS W/ PORT W/O FL MOD SED: IMG2290

## 2020-11-27 LAB — CBC WITH DIFFERENTIAL/PLATELET
Abs Immature Granulocytes: 0.05 10*3/uL (ref 0.00–0.07)
Basophils Absolute: 0 10*3/uL (ref 0.0–0.1)
Basophils Relative: 1 %
Eosinophils Absolute: 0.2 10*3/uL (ref 0.0–0.5)
Eosinophils Relative: 4 %
HCT: 41.6 % (ref 39.0–52.0)
Hemoglobin: 14.6 g/dL (ref 13.0–17.0)
Immature Granulocytes: 1 %
Lymphocytes Relative: 6 %
Lymphs Abs: 0.3 10*3/uL — ABNORMAL LOW (ref 0.7–4.0)
MCH: 32.1 pg (ref 26.0–34.0)
MCHC: 35.1 g/dL (ref 30.0–36.0)
MCV: 91.4 fL (ref 80.0–100.0)
Monocytes Absolute: 0.5 10*3/uL (ref 0.1–1.0)
Monocytes Relative: 10 %
Neutro Abs: 3.8 10*3/uL (ref 1.7–7.7)
Neutrophils Relative %: 78 %
Platelets: 197 10*3/uL (ref 150–400)
RBC: 4.55 MIL/uL (ref 4.22–5.81)
RDW: 13 % (ref 11.5–15.5)
WBC: 4.9 10*3/uL (ref 4.0–10.5)
nRBC: 0 % (ref 0.0–0.2)

## 2020-11-27 LAB — PROTIME-INR
INR: 0.9 (ref 0.8–1.2)
Prothrombin Time: 11.8 seconds (ref 11.4–15.2)

## 2020-11-27 MED ORDER — MIDAZOLAM HCL 2 MG/2ML IJ SOLN
INTRAMUSCULAR | Status: AC
  Filled 2020-11-27: qty 2

## 2020-11-27 MED ORDER — LIDOCAINE-EPINEPHRINE 2 %-1:100000 IJ SOLN
INTRAMUSCULAR | Status: AC | PRN
  Administered 2020-11-27: 10 mL via INTRADERMAL

## 2020-11-27 MED ORDER — SODIUM CHLORIDE 0.9 % IV SOLN: INTRAVENOUS | Status: DC

## 2020-11-27 MED ORDER — CEFAZOLIN SODIUM-DEXTROSE 2-4 GM/100ML-% IV SOLN: 2.0000 g | INTRAVENOUS | Status: AC

## 2020-11-27 MED ORDER — FENTANYL CITRATE (PF) 100 MCG/2ML IJ SOLN
INTRAMUSCULAR | Status: AC | PRN
  Administered 2020-11-27 (×2): 50 ug via INTRAVENOUS

## 2020-11-27 MED ORDER — MIDAZOLAM HCL 2 MG/2ML IJ SOLN
INTRAMUSCULAR | Status: AC | PRN
Start: 2020-11-27 — End: 2020-11-27
  Administered 2020-11-27 (×2): 1 mg via INTRAVENOUS

## 2020-11-27 MED ORDER — CEFAZOLIN SODIUM-DEXTROSE 2-4 GM/100ML-% IV SOLN
INTRAVENOUS | Status: AC
  Administered 2020-11-27: 2 g via INTRAVENOUS
  Filled 2020-11-27: qty 100

## 2020-11-27 MED ORDER — FENTANYL CITRATE (PF) 100 MCG/2ML IJ SOLN
INTRAMUSCULAR | Status: AC
  Filled 2020-11-27: qty 2

## 2020-11-27 MED ORDER — LIDOCAINE-EPINEPHRINE (PF) 2 %-1:200000 IJ SOLN
INTRAMUSCULAR | Status: AC
  Filled 2020-11-27: qty 20

## 2020-11-27 NOTE — Discharge Instructions (Signed)
Implanted Port Removal, Care After This sheet gives you information about how to care for yourself after your procedure. Your health care provider may also give you more specific instructions. If you have problems or questions, contact your health care provider. What can I expect after the procedure? After the procedure, it is common to have:  Soreness or pain near your incision.  Some swelling or bruising near your incision. Follow these instructions at home: Medicines  Take over-the-counter and prescription medicines only as told by your health care provider.  If you were prescribed an antibiotic medicine, take it as told by your health care provider. Do not stop taking the antibiotic even if you start to feel better. Bathing  Do not take baths, swim, or use a hot tub until your health care provider approves. Ask your health care provider if you can take showers. You may only be allowed to take sponge baths. Incision care  Follow instructions from your health care provider about how to take care of your incision. Make sure you: ? Wash your hands with soap and water before you change your bandage (dressing). If soap and water are not available, use hand sanitizer. ? Change your dressing as told by your health care provider. ? Keep your dressing dry. ? Leave stitches (sutures), skin glue, or adhesive strips in place. These skin closures may need to stay in place for 2 weeks or longer. If adhesive strip edges start to loosen and curl up, you may trim the loose edges. Do not remove adhesive strips completely unless your health care provider tells you to do that.  Check your incision area every day for signs of infection. Check for: ? More redness, swelling, or pain. ? More fluid or blood. ? Warmth. ? Pus or a bad smell.   Driving  Do not drive for 24 hours if you were given a medicine to help you relax (sedative) during your procedure.  If you did not receive a sedative, ask your  health care provider when it is safe to drive.   Activity  Return to your normal activities as told by your health care provider. Ask your health care provider what activities are safe for you.  Do not lift anything that is heavier than 10 lb (4.5 kg), or the limit that you are told, until your health care provider says that it is safe.  Do not do activities that involve lifting your arms over your head. General instructions  Do not use any products that contain nicotine or tobacco, such as cigarettes and e-cigarettes. These can delay healing. If you need help quitting, ask your health care provider.  Keep all follow-up visits as told by your health care provider. This is important. Contact a health care provider if:  You have more redness, swelling, or pain around your incision.  You have more fluid or blood coming from your incision.  Your incision feels warm to the touch.  You have pus or a bad smell coming from your incision.  You have pain that is not relieved by your pain medicine. Get help right away if you have:  A fever or chills.  Chest pain.  Difficulty breathing. Summary  After the procedure, it is common to have pain, soreness, swelling, or bruising near your incision.  If you were prescribed an antibiotic medicine, take it as told by your health care provider. Do not stop taking the antibiotic even if you start to feel better.  Do not drive for   24 hours if you were given a sedative during your procedure.  Return to your normal activities as told by your health care provider. Ask your health care provider what activities are safe for you. This information is not intended to replace advice given to you by your health care provider. Make sure you discuss any questions you have with your health care provider. Document Revised: 12/08/2017 Document Reviewed: 12/08/2017 Elsevier Patient Education  2021 Elsevier Inc.  Moderate Conscious Sedation, Adult, Care  After This sheet gives you information about how to care for yourself after your procedure. Your health care provider may also give you more specific instructions. If you have problems or questions, contact your health care provider. What can I expect after the procedure? After the procedure, it is common to have:  Sleepiness for several hours.  Impaired judgment for several hours.  Difficulty with balance.  Vomiting if you eat too soon. Follow these instructions at home: For the time period you were told by your health care provider:  Rest.  Do not participate in activities where you could fall or become injured.  Do not drive or use machinery.  Do not drink alcohol.  Do not take sleeping pills or medicines that cause drowsiness.  Do not make important decisions or sign legal documents.  Do not take care of children on your own.      Eating and drinking  Follow the diet recommended by your health care provider.  Drink enough fluid to keep your urine pale yellow.  If you vomit: ? Drink water, juice, or soup when you can drink without vomiting. ? Make sure you have little or no nausea before eating solid foods.   General instructions  Take over-the-counter and prescription medicines only as told by your health care provider.  Have a responsible adult stay with you for the time you are told. It is important to have someone help care for you until you are awake and alert.  Do not smoke.  Keep all follow-up visits as told by your health care provider. This is important. Contact a health care provider if:  You are still sleepy or having trouble with balance after 24 hours.  You feel light-headed.  You keep feeling nauseous or you keep vomiting.  You develop a rash.  You have a fever.  You have redness or swelling around the IV site. Get help right away if:  You have trouble breathing.  You have new-onset confusion at home. Summary  After the procedure,  it is common to feel sleepy, have impaired judgment, or feel nauseous if you eat too soon.  Rest after you get home. Know the things you should not do after the procedure.  Follow the diet recommended by your health care provider and drink enough fluid to keep your urine pale yellow.  Get help right away if you have trouble breathing or new-onset confusion at home. This information is not intended to replace advice given to you by your health care provider. Make sure you discuss any questions you have with your health care provider. Document Revised: 02/22/2020 Document Reviewed: 09/20/2019 Elsevier Patient Education  2021 Elsevier Inc.  

## 2020-11-27 NOTE — H&P (Signed)
Chief Complaint: Patient was seen in consultation today for stage III follicular lymphoma  Referring Physician(s): Dorsey,John T IV  Supervising Physician: Oley Balm  Patient Status: [facility] - Out-pt  History of Present Illness: Jorge Gould is a 65 y.o. male with history of Stage III follicular lymphoma known to IR from prior Port-A-Cath placement 04/09/20 by Dr. Miles Costain has completed treatment with evidence of stable disease.  His most recent PET 10/24/20 showed: 1. Substantial partial response. Residual mild hypermetabolism within the retroperitoneal and left common iliac chains (Deauville score 3). No residual metabolically active adenopathy in the neck or chest. Low level patchy uptake throughout the skeleton, substantially decreased. Spleen is normal size and now non-hypermetabolic.  Given his disease stability, patient is referred to IR for Port-A-Cath removal at the request of Dr. Leonides Schanz IV.   Patient presents today in his usual state of health. He has been NPO.  Denies fever, chills, nausea, cough, shortness of breath, dysuria. He denies issues or concerns related to his [place].  It has worked well without issue.  He is agreeable to removal today.   Past Medical History:  Diagnosis Date  . Obesity     Past Surgical History:  Procedure Laterality Date  . ANKLE FRACTURE SURGERY  2016   right, trauma repair after fall down stairs  . HERNIA REPAIR     umbilical  . IR IMAGING GUIDED PORT INSERTION  04/09/2020  . TONSILLECTOMY      Allergies: Patient has no known allergies.  Medications: Prior to Admission medications   Medication Sig Start Date End Date Taking? Authorizing Provider  acyclovir (ZOVIRAX) 400 MG tablet Take 1 tablet (400 mg total) by mouth daily. 03/28/20  Yes Jaci Standard, MD  allopurinol (ZYLOPRIM) 300 MG tablet TAKE 1 TABLET(300 MG) BY MOUTH DAILY 09/04/20  Yes Jaci Standard, MD  dexamethasone (DECADRON) 4 MG tablet Take 2 tablets (8 mg  total) by mouth daily. Start the day after bendamustine chemotherapy for 2 days. Take with food. 03/28/20  Yes Jaci Standard, MD  lidocaine-prilocaine (EMLA) cream Apply to affected area once 03/28/20  Yes Jaci Standard, MD  ondansetron (ZOFRAN) 8 MG tablet Take 1 tablet (8 mg total) by mouth 2 (two) times daily as needed for refractory nausea / vomiting. Start on day 2 after bendamustine chemo. 03/28/20  Yes Jaci Standard, MD  prochlorperazine (COMPAZINE) 10 MG tablet Take 1 tablet (10 mg total) by mouth every 6 (six) hours as needed (Nausea or vomiting). 03/28/20  Yes Jaci Standard, MD  Naphazoline HCl (CLEAR EYES OP) Place 1 drop into both eyes daily as needed (redness). Patient not taking: No sig reported    [provider]  sildenafil (VIAGRA) 100 MG tablet Take 0.5-1 tablets (50-100 mg total) by mouth daily as needed for erectile dysfunction. 05/28/20   Tysinger, Kermit Balo, PA-C     Family History  Problem Relation Age of Onset  . Cancer Mother        lung; former smoker  . COPD Father        emphysema  . Diabetes Maternal Aunt   . Heart disease Maternal Uncle   . Heart disease Maternal Uncle   . Diabetes Maternal Aunt   . Stroke Neg Hx   . Hyperlipidemia Neg Hx   . Hypertension Neg Hx     Social History   Socioeconomic History  . Marital status: Married    Spouse name:  Not on file  . Number of children: Not on file  . Years of education: Not on file  . Highest education level: Not on file  Occupational History  . Not on file  Tobacco Use  . Smoking status: Never Smoker  . Smokeless tobacco: Never Used  Vaping Use  . Vaping Use: Never used  Substance and Sexual Activity  . Alcohol use: Yes    Alcohol/week: 3.0 standard drinks    Types: 3 Shots of liquor per week  . Drug use: Not Currently  . Sexual activity: Not on file  Other Topics Concern  . Not on file  Social History Narrative   Engaged, plan to marry in March 2021, been together since  2014.  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   Most exercise on the job, working 7 days per week.   No children, but fiance has 5 children.    11/2019   Social Determinants of Health   Financial Resource Strain: Not on file  Food Insecurity: Not on file  Transportation Needs: Not on file  Physical Activity: Not on file  Stress: Not on file  Social Connections: Not on file     Review of Systems: A 12 point ROS discussed and pertinent positives are indicated in the HPI above.  All other systems are negative.  Review of Systems  Constitutional: Negative for fatigue and fever.  Respiratory: Negative for cough and shortness of breath.   Cardiovascular: Negative for chest pain.  Gastrointestinal: Negative for abdominal pain, nausea and vomiting.  Genitourinary: Negative for dysuria.  Musculoskeletal: Negative for back pain.  Psychiatric/Behavioral: Negative for behavioral problems and confusion.    Vital Signs: BP (!) 143/87 (BP Location: Right Arm)   Temp 97.9 F (36.6 C) (Oral)   Resp 18   SpO2 99%   Physical Exam Vitals and nursing note reviewed.  Constitutional:      General: He is not in acute distress.    Appearance: Normal appearance. He is not ill-appearing.  HENT:     Mouth/Throat:     Mouth: Mucous membranes are moist.     Pharynx: Oropharynx is clear.  Neck:     Comments: Port-A-Cath in place on right, well-healed.  Cardiovascular:     Rate and Rhythm: Regular rhythm. Bradycardia present.  Pulmonary:     Effort: Pulmonary effort is normal. No respiratory distress.     Breath sounds: Normal breath sounds.  Abdominal:     General: Abdomen is flat.     Palpations: Abdomen is soft.  Musculoskeletal:     Cervical back: Normal range of motion and neck supple.  Skin:    General: Skin is warm and dry.  Neurological:     General: No focal deficit present.     Mental Status: He is alert and oriented to person, place, and time. Mental status is at  baseline.  Psychiatric:        Mood and Affect: Mood normal.        Behavior: Behavior normal.        Thought Content: Thought content normal.        Judgment: Judgment normal.      MD Evaluation Airway: WNL Heart: WNL Abdomen: WNL Chest/ Lungs: WNL ASA  Classification: 3 Mallampati/Airway Score: Two   Imaging: No results found.  Labs:  CBC: Recent Labs    08/25/20 1038 09/17/20 0952 10/29/20 0808 11/27/20 0810  WBC 4.9 3.7* 4.7 4.9  HGB 12.4* 11.9*  13.5 14.6  HCT 35.5* 35.2* 39.0 41.6  PLT 160 171 208 197    COAGS: Recent Labs    04/09/20 1230 11/27/20 0810  INR 1.1 0.9    BMP: Recent Labs    06/11/20 1026 06/19/20 1459 06/25/20 1104 07/23/20 1015 08/25/20 1038 09/17/20 0952 10/29/20 0808  NA 138 139 138 141 141 139 143  K 4.0 4.0 4.0 3.9 4.1 3.8 4.3  CL 105 106 104 107 106 107 105  CO2 26 23 25 25 28 26 28   GLUCOSE 98 82 89 109* 89 92 113*  BUN 21 26* 18 24* 18 17 24*  CALCIUM 9.6 9.9 9.5 9.2 9.5 9.1 9.7  CREATININE 1.09 1.05 0.91 1.01 0.85 0.96 1.18  GFRNONAA >60 >60 >60 >60 >60 >60 >60  GFRAA >60 >60 >60 >60  --   --   --     LIVER FUNCTION TESTS: Recent Labs    07/23/20 1015 08/25/20 1038 09/17/20 0952 10/29/20 0808  BILITOT 0.4 0.4 0.7 0.6  AST 14* 14* 15 14*  ALT 13 13 13 16   ALKPHOS 109 103 91 95  PROT 7.1 6.9 6.8 7.1  ALBUMIN 3.8 3.8 3.7 4.0    TUMOR MARKERS: No results for input(s): AFPTM, CEA, CA199, CHROMGRNA in the last 8760 hours.  Assessment and Plan: Patient with past medical history of Stage III follicular lymphoma presents after the completion of therapy for Port-A-Cath removal at the request of Dr. Narda Rutherford IV.  Case reviewed by Dr. Vernard Gambles who approves patient for procedure.  Patient presents today in their usual state of health.  He has been NPO and is not currently on blood thinners.  He is agreeable to proceed today.   Risks and benefits of image guided port-a-catheter placement was discussed with  the patient including, but not limited to bleeding, infection, pneumothorax, or fibrin sheath development and need for additional procedures.  All of the patient's questions were answered, patient is agreeable to proceed. Consent signed and in chart.  Thank you for this interesting consult.  I greatly enjoyed meeting PAPA PIERCEFIELD and look forward to participating in their care.  A copy of this report was sent to the requesting provider on this date.  Electronically Signed: Docia Barrier, PA 11/27/2020, 9:15 AM   I spent a total of  30 Minutes   in face to face in clinical consultation, greater than 50% of which was counseling/coordinating care for Stage III follicular lymphoma.

## 2020-11-27 NOTE — Procedures (Signed)
  Procedure: Removal port catheter   EBL:   minimal Complications:  none immediate  See full dictation in [employer].  Dillard Cannon MD Main # [phone] Pager  [phone] Mobile [phone]

## 2021-01-16 ENCOUNTER — Telehealth: Payer: Self-pay | Admitting: Hematology and Oncology

## 2021-01-16 NOTE — Telephone Encounter (Signed)
R/s 3/23 appt due to provider being on PAL. Called and spoke with patient. Confirmed new date and time

## 2021-01-28 ENCOUNTER — Other Ambulatory Visit: Payer: Medicare Other

## 2021-01-28 ENCOUNTER — Ambulatory Visit: Payer: Medicare Other | Admitting: Hematology and Oncology

## 2021-02-02 ENCOUNTER — Inpatient Hospital Stay: Payer: Medicare Other | Attending: Hematology and Oncology

## 2021-02-02 ENCOUNTER — Encounter: Payer: Self-pay | Admitting: Hematology and Oncology

## 2021-02-02 ENCOUNTER — Other Ambulatory Visit: Payer: Self-pay

## 2021-02-02 ENCOUNTER — Inpatient Hospital Stay: Payer: Medicare Other | Admitting: Hematology and Oncology

## 2021-02-02 VITALS — BP 157/87 | HR 53 | Temp 97.4°F | Resp 18 | Ht 71.5 in | Wt 245.1 lb

## 2021-02-02 DIAGNOSIS — C8233 Follicular lymphoma grade IIIa, intra-abdominal lymph nodes: Secondary | ICD-10-CM

## 2021-02-02 DIAGNOSIS — C8221 Follicular lymphoma grade III, unspecified, lymph nodes of head, face, and neck: Secondary | ICD-10-CM | POA: Insufficient documentation

## 2021-02-02 DIAGNOSIS — R591 Generalized enlarged lymph nodes: Secondary | ICD-10-CM | POA: Diagnosis not present

## 2021-02-02 LAB — CBC WITH DIFFERENTIAL (CANCER CENTER ONLY)
Abs Immature Granulocytes: 0.08 10*3/uL — ABNORMAL HIGH (ref 0.00–0.07)
Basophils Absolute: 0 10*3/uL (ref 0.0–0.1)
Basophils Relative: 1 %
Eosinophils Absolute: 0.2 10*3/uL (ref 0.0–0.5)
Eosinophils Relative: 3 %
HCT: 39.2 % (ref 39.0–52.0)
Hemoglobin: 13.8 g/dL (ref 13.0–17.0)
Immature Granulocytes: 1 %
Lymphocytes Relative: 7 %
Lymphs Abs: 0.4 10*3/uL — ABNORMAL LOW (ref 0.7–4.0)
MCH: 31.8 pg (ref 26.0–34.0)
MCHC: 35.2 g/dL (ref 30.0–36.0)
MCV: 90.3 fL (ref 80.0–100.0)
Monocytes Absolute: 0.5 10*3/uL (ref 0.1–1.0)
Monocytes Relative: 9 %
Neutro Abs: 4.3 10*3/uL (ref 1.7–7.7)
Neutrophils Relative %: 79 %
Platelet Count: 199 10*3/uL (ref 150–400)
RBC: 4.34 MIL/uL (ref 4.22–5.81)
RDW: 13.1 % (ref 11.5–15.5)
WBC Count: 5.5 10*3/uL (ref 4.0–10.5)
nRBC: 0 % (ref 0.0–0.2)

## 2021-02-02 LAB — CMP (CANCER CENTER ONLY)
ALT: 21 U/L (ref 0–44)
AST: 15 U/L (ref 15–41)
Albumin: 4 g/dL (ref 3.5–5.0)
Alkaline Phosphatase: 100 U/L (ref 38–126)
Anion gap: 11 (ref 5–15)
BUN: 23 mg/dL (ref 8–23)
CO2: 27 mmol/L (ref 22–32)
Calcium: 9.1 mg/dL (ref 8.9–10.3)
Chloride: 104 mmol/L (ref 98–111)
Creatinine: 1.13 mg/dL (ref 0.61–1.24)
GFR, Estimated: >60 mL/min (ref >60)
Glucose, Bld: 104 mg/dL — ABNORMAL HIGH (ref 70–99)
Potassium: 5 mmol/L (ref 3.5–5.1)
Sodium: 142 mmol/L (ref 135–145)
Total Bilirubin: 0.5 mg/dL (ref 0.3–1.2)
Total Protein: 6.8 g/dL (ref 6.5–8.1)

## 2021-02-02 LAB — LACTATE DEHYDROGENASE: LDH: 160 U/L (ref 98–192)

## 2021-02-02 NOTE — Progress Notes (Signed)
[facility] [phone]   [phone]  PROGRESS NOTE  Patient Care Team: Tysinger, Leward Quan as PCP - General (Family Medicine)  Hematological/Oncological History # Follicular Lymphoma. Stage III 1) 01/07/2020: CT Neck W contrast showed cervical lymphadenopathy bilaterally, left greater than right. Numerous enlarged lymph nodes are present 2)  01/22/2020: [place] Core biopsy performed which revealed overall features consistent with involvement by non-Hodgkin's B-cell lymphoma and the morphologic and phenotypic findings favor follicular lymphoma.  3) 01/31/2020: establish care with Dr. Lorenso Courier  4) 02/12/2020: PET CT scan demonstrates bulky adenopathy in the neck, chest, abdomen and pelvis and diffuse skeletal uptake is suspicious for (but not diagnostic) of marrow involvement. 5) 04/30/2020: Cycle 1 Day 1 of Bendamustine/Rituximab 6) 05/29/2020: Cycle 2 Day 1 of Bendamustine/Rituximab 7) 06/25/2020: Cycle 3 Day 1 of Bendamustine/Rituximab 8) 07/23/2020: Cycle 4 Day 1 of Bendamustine/Rituximab 9) 08/25/2020: Cycle 5 Day 1 of Bendamustine/Rituximab 10)09/17/2020: Cycle 6 Day 1 of Bendamustine/Rituximab 11) 10/26/2020: PET CT scan shows partial response (Deauville Score 3 in lymph nodes but some residual FDG avidity in the bone marrow).   Interval History:  Jorge Gould 65 y.o. male with medical history significant for Stage III follicular lymphoma who presents for a follow up visit. The patient's last visit was on 10/29/2020 to review his post treatment PET, which showed a PR. In the interim since the last visit Jorge Gould has had no major changes in his health.  On exam today Jorge Gould reports that he has been well in the interim since our last visit.  He has gained weight and is up 12 lbs since his visit in December. He attributes that to eating while out on work sites ([employer], unhealthy food).  He continues to work hard and has no limitations in his physical  activity.  He denies any fevers, chills, sweats, nausea, vomiting or diarrhea.  A full 10 point ROS is listed below.  MEDICAL HISTORY:  Past Medical History:  Diagnosis Date  . Obesity     SURGICAL HISTORY: Past Surgical History:  Procedure Laterality Date  . ANKLE FRACTURE SURGERY  2016   right, trauma repair after fall down stairs  . HERNIA REPAIR     umbilical  . IR IMAGING GUIDED PORT INSERTION  04/09/2020  . IR REMOVAL TUN ACCESS W/ PORT W/O FL MOD SED  11/27/2020  . TONSILLECTOMY      SOCIAL HISTORY: Social History   Socioeconomic History  . Marital status: Married    Spouse name: Not on file  . Number of children: Not on file  . Years of education: Not on file  . Highest education level: Not on file  Occupational History  . Not on file  Tobacco Use  . Smoking status: Never Smoker  . Smokeless tobacco: Never Used  Vaping Use  . Vaping Use: Never used  Substance and Sexual Activity  . Alcohol use: Yes    Alcohol/week: 3.0 standard drinks    Types: 3 Shots of liquor per week  . Drug use: Not Currently  . Sexual activity: Not on file  Other Topics Concern  . Not on file  Social History Narrative   Engaged, plan to marry in March 2021, been together since 2014.  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   Most exercise on the job, working 7 days per week.   No children, but fiance has 5 children.    11/2019   Social Determinants of Health  Financial Resource Strain: Not on file  Food Insecurity: Not on file  Transportation Needs: Not on file  Physical Activity: Not on file  Stress: Not on file  Social Connections: Not on file  Intimate Partner Violence: Not on file    FAMILY HISTORY: Family History  Problem Relation Age of Onset  . Cancer Mother        lung; former smoker  . COPD Father        emphysema  . Diabetes Maternal Aunt   . Heart disease Maternal Uncle   . Heart disease Maternal Uncle   . Diabetes Maternal Aunt   . Stroke Neg  Hx   . Hyperlipidemia Neg Hx   . Hypertension Neg Hx     ALLERGIES:  has No Known Allergies.  MEDICATIONS:  Current Outpatient Medications  Medication Sig Dispense Refill  . acyclovir (ZOVIRAX) 400 MG tablet Take 1 tablet (400 mg total) by mouth daily. 30 tablet 3  . allopurinol (ZYLOPRIM) 300 MG tablet TAKE 1 TABLET(300 MG) BY MOUTH DAILY 30 tablet 3  . dexamethasone (DECADRON) 4 MG tablet Take 2 tablets (8 mg total) by mouth daily. Start the day after bendamustine chemotherapy for 2 days. Take with food. 30 tablet 1  . lidocaine-prilocaine (EMLA) cream Apply to affected area once 30 g 3  . Naphazoline HCl (CLEAR EYES OP) Place 1 drop into both eyes daily as needed (redness). (Patient not taking: No sig reported)    . ondansetron (ZOFRAN) 8 MG tablet Take 1 tablet (8 mg total) by mouth 2 (two) times daily as needed for refractory nausea / vomiting. Start on day 2 after bendamustine chemo. 30 tablet 1  . prochlorperazine (COMPAZINE) 10 MG tablet Take 1 tablet (10 mg total) by mouth every 6 (six) hours as needed (Nausea or vomiting). 30 tablet 1  . sildenafil (VIAGRA) 100 MG tablet Take 0.5-1 tablets (50-100 mg total) by mouth daily as needed for erectile dysfunction. 10 tablet 0   No current facility-administered medications for this visit.    REVIEW OF SYSTEMS:   Constitutional: ( - ) fevers, ( - )  chills , ( - ) night sweats Eyes: ( - ) blurriness of vision, ( - ) double vision, ( - ) watery eyes Ears, nose, mouth, throat, and face: ( - ) mucositis, ( - ) sore throat Respiratory: ( - ) cough, ( - ) dyspnea, ( - ) wheezes Cardiovascular: ( - ) palpitation, ( - ) chest discomfort, ( - ) lower extremity swelling Gastrointestinal:  ( - ) nausea, ( - ) heartburn, ( - ) change in bowel habits Skin: ( - ) abnormal skin rashes Lymphatics: ( - ) new lymphadenopathy, ( - ) easy bruising Neurological: ( - ) numbness, ( - ) tingling, ( - ) new weaknesses Behavioral/Psych: ( - ) mood change, (  - ) new changes  All other systems were reviewed with the patient and are negative.  PHYSICAL EXAMINATION: ECOG PERFORMANCE STATUS: 1 - Symptomatic but completely ambulatory  Vitals:   02/02/21 0759  BP: (!) 157/87  Pulse: (!) 53  Resp: 18  Temp: (!) 97.4 F (36.3 C)  SpO2: 99%   Filed Weights   02/02/21 0759  Weight: 245 lb 1.6 oz (111.2 kg)    GENERAL: well appearing middle aged Caucasian male in NAD  SKIN: skin color, texture, turgor are normal, no rashes or significant lesions EYES: conjunctiva are pink and non-injected, sclera clear NECK: supple, non-tender LYMPH:  no palpable  lymphadenopathy in the cervical chain LUNGS: clear to auscultation and percussion with normal breathing effort HEART: regular rate & rhythm and no murmurs and no lower extremity edema Musculoskeletal: no cyanosis of digits and no clubbing  PSYCH: alert & oriented x 3, fluent speech NEURO: no focal motor/sensory deficits  LABORATORY DATA:  I have reviewed the data as listed CBC Latest Ref Rng & Units 02/02/2021 11/27/2020 10/29/2020  WBC 4.0 - 10.5 K/uL 5.5 4.9 4.7  Hemoglobin 13.0 - 17.0 g/dL 13.8 14.6 13.5  Hematocrit 39.0 - 52.0 % 39.2 41.6 39.0  Platelets 150 - 400 K/uL 199 197 208    CMP Latest Ref Rng & Units 02/02/2021 10/29/2020 09/17/2020  Glucose 70 - 99 mg/dL 104(H) 113(H) 92  BUN 8 - 23 mg/dL 23 24(H) 17  Creatinine 0.61 - 1.24 mg/dL 1.13 1.18 0.96  Sodium 135 - 145 mmol/L 142 143 139  Potassium 3.5 - 5.1 mmol/L 5.0 4.3 3.8  Chloride 98 - 111 mmol/L 104 105 107  CO2 22 - 32 mmol/L 27 28 26   Calcium 8.9 - 10.3 mg/dL 9.1 9.7 9.1  Total Protein 6.5 - 8.1 g/dL 6.8 7.1 6.8  Total Bilirubin 0.3 - 1.2 mg/dL 0.5 0.6 0.7  Alkaline Phos 38 - 126 U/L 100 95 91  AST 15 - 41 U/L 15 14(L) 15  ALT 0 - 44 U/L 21 16 13    RADIOGRAPHIC STUDIES: I have personally reviewed the radiological images as listed and agreed with the findings in the report: Marked response in the patient's  lymphadenopathy and decrease in his FDG avidity.  Bone marrow involvement also appears considerably less.  Overall consistent with a partial response.  No results found.  ASSESSMENT & PLAN Jorge Gould 65 y.o. male with medical history significant for newly diagnosed follicular lymphoma who presents for a follow up visit. He completed 6 cycles of Bendamustine/Rituximab therapy.   On exam today  Jorge Gould reports that he feels well overall.  He has been gaining weight but has no other concerning symptoms.  Previously we discussed the results of his PET CT scan and the need for continued surveillance.  I noted that it was a partial response and that at this time we can continue to observe and consider retreatment in the event that the lymphadenopathy were to flare again.  The patient and spouse voiced their understanding of the plan.  FLIPI-2 Score: 4  (high risk). Initial indication for treatment: bulky lymphadenopathy with 15 x 9 cm conglomerate of lymph nodes in the abdomen.  The regimen of choice for this patient was Bendamustine/Rituximab. Treatment will consist of 6 cycles, each lasting 28 days. The patient will receive Bendamustine 90mg /m2 IV on Day 1 and Day 2 in addition to Rituximab 375mg /m2 IV on Day 1. This was administered with curative intent. Administered x 6 cycles. Repeat PET CT scan on 10/26/2020 showed a partial response. OK to enter observation.   # Follicular Lymphoma. Stage III --patient completed Cycle 6 of R-Benda therapy. This was administered with curative intent.  --PET CT scan after 6 cycles of R-Benda showed substantial partial response. Residual mild hypermetabolism within the retroperitoneal and left common iliac chains (Deauville score 3). No residual metabolically active adenopathy in the neck or chest --per [place] Criteria, patient is a Partial Response.  --based on NCCN recommendations, patient is appropriate for observation with q 6 month CT scans x 2 years and  q 3-6 month f/u clinic visits.  --RTC in 3 months for return  visit with CT scan.  #Fertility Preservation  --referred patient to [facility] for Fertility Clinic and Sperm preservation.  --unfortunately patient was noted to be sterile on exam, no sperm to freeze.   #Supportive Care --no longer required. --port can be removed at this time.   No orders of the defined types were placed in this encounter.  All questions were answered. The patient knows to call the clinic with any problems, questions or concerns.  A total of more than 30 minutes were spent on this encounter and over half of that time was spent on counseling and coordination of care as outlined above.   Ledell Peoples, MD Department of Hematology/Oncology [place] [facility] Phone: [phone] Pager: [phone] Email: [email or web address]  02/02/2021 8:50 AM

## 2021-02-03 ENCOUNTER — Telehealth: Payer: Self-pay | Admitting: Hematology and Oncology

## 2021-02-03 NOTE — Telephone Encounter (Signed)
Scheduled per los. Called and spoke with patient. Confirmed appt 

## 2021-04-27 ENCOUNTER — Ambulatory Visit (HOSPITAL_COMMUNITY)
Admission: RE | Admit: 2021-04-27 | Discharge: 2021-04-27 | Disposition: A | Discharge status: Home / Self Care | Payer: Medicare Other | Source: Ambulatory Visit | Attending: Hematology and Oncology | Admitting: Hematology and Oncology

## 2021-04-27 ENCOUNTER — Other Ambulatory Visit: Payer: Self-pay

## 2021-04-27 ENCOUNTER — Encounter (HOSPITAL_COMMUNITY): Payer: Self-pay

## 2021-04-27 ENCOUNTER — Encounter (HOSPITAL_COMMUNITY): Payer: Self-pay | Admitting: Radiology

## 2021-04-27 DIAGNOSIS — K4090 Unilateral inguinal hernia, without obstruction or gangrene, not specified as recurrent: Secondary | ICD-10-CM | POA: Diagnosis not present

## 2021-04-27 DIAGNOSIS — C8233 Follicular lymphoma grade IIIa, intra-abdominal lymph nodes: Secondary | ICD-10-CM | POA: Diagnosis not present

## 2021-04-27 DIAGNOSIS — C801 Malignant (primary) neoplasm, unspecified: Secondary | ICD-10-CM | POA: Insufficient documentation

## 2021-04-27 DIAGNOSIS — Z8572 Personal history of non-Hodgkin lymphomas: Secondary | ICD-10-CM | POA: Diagnosis not present

## 2021-04-27 DIAGNOSIS — I712 Thoracic aortic aneurysm, without rupture: Secondary | ICD-10-CM | POA: Diagnosis not present

## 2021-04-27 DIAGNOSIS — K439 Ventral hernia without obstruction or gangrene: Secondary | ICD-10-CM | POA: Diagnosis not present

## 2021-04-27 LAB — POCT I-STAT CREATININE: Creatinine, Ser: 1 mg/dL (ref 0.61–1.24)

## 2021-04-27 MED ORDER — SODIUM CHLORIDE (PF) 0.9 % IJ SOLN
INTRAMUSCULAR | Status: AC
  Filled 2021-04-27: qty 50

## 2021-04-27 MED ORDER — IOHEXOL 9 MG/ML PO SOLN
1000.0000 mL | ORAL | Status: AC
  Administered 2021-04-27: 08:00:00 1000 mL via ORAL

## 2021-04-27 MED ORDER — IOHEXOL 9 MG/ML PO SOLN
ORAL | Status: AC
  Filled 2021-04-27: qty 1000

## 2021-04-27 MED ORDER — IOHEXOL 300 MG/ML  SOLN
100.0000 mL | Freq: Once | INTRAMUSCULAR | Status: AC | PRN
  Administered 2021-04-27: 10:00:00 100 mL via INTRAVENOUS

## 2021-04-29 ENCOUNTER — Telehealth: Payer: Self-pay | Admitting: *Deleted

## 2021-04-29 NOTE — Telephone Encounter (Signed)
TCT patient regarding recent scan results.No answer and no vm available to leave him a message. Will try calling at another time.

## 2021-04-29 NOTE — Telephone Encounter (Signed)
-----   Message from Orson Slick, MD sent at 04/29/2021 11:57 AM EDT ----- Please let Jorge Gould know that his CT scan showed no evidence of new or enlarging lymph nodes. This is an excellent result. We will see him back next week.   ----- Message ----- From: Interface, Rad Results In Sent: 04/27/2021   2:19 PM EDT To: Orson Slick, MD

## 2021-05-03 ENCOUNTER — Other Ambulatory Visit: Payer: Self-pay | Admitting: Hematology and Oncology

## 2021-05-03 DIAGNOSIS — C8233 Follicular lymphoma grade IIIa, intra-abdominal lymph nodes: Secondary | ICD-10-CM

## 2021-05-04 ENCOUNTER — Inpatient Hospital Stay: Payer: Medicare Other | Attending: Hematology and Oncology | Admitting: Hematology and Oncology

## 2021-05-04 ENCOUNTER — Other Ambulatory Visit: Payer: Self-pay

## 2021-05-04 ENCOUNTER — Inpatient Hospital Stay: Payer: Medicare Other

## 2021-05-04 VITALS — BP 144/88 | HR 50 | Temp 97.1°F | Resp 17 | Ht 71.5 in | Wt 243.7 lb

## 2021-05-04 DIAGNOSIS — Z9221 Personal history of antineoplastic chemotherapy: Secondary | ICD-10-CM | POA: Diagnosis not present

## 2021-05-04 DIAGNOSIS — C8290 Follicular lymphoma, unspecified, unspecified site: Secondary | ICD-10-CM | POA: Diagnosis not present

## 2021-05-04 DIAGNOSIS — Z79899 Other long term (current) drug therapy: Secondary | ICD-10-CM | POA: Diagnosis not present

## 2021-05-04 DIAGNOSIS — C8233 Follicular lymphoma grade IIIa, intra-abdominal lymph nodes: Secondary | ICD-10-CM | POA: Diagnosis not present

## 2021-05-04 DIAGNOSIS — R591 Generalized enlarged lymph nodes: Secondary | ICD-10-CM

## 2021-05-04 LAB — CMP (CANCER CENTER ONLY)
ALT: 22 U/L (ref 0–44)
AST: 19 U/L (ref 15–41)
Albumin: 3.8 g/dL (ref 3.5–5.0)
Alkaline Phosphatase: 91 U/L (ref 38–126)
Anion gap: 10 (ref 5–15)
BUN: 21 mg/dL (ref 8–23)
CO2: 24 mmol/L (ref 22–32)
Calcium: 9.3 mg/dL (ref 8.9–10.3)
Chloride: 107 mmol/L (ref 98–111)
Creatinine: 0.99 mg/dL (ref 0.61–1.24)
GFR, Estimated: >60 mL/min (ref >60)
Glucose, Bld: 109 mg/dL — ABNORMAL HIGH (ref 70–99)
Potassium: 4.2 mmol/L (ref 3.5–5.1)
Sodium: 141 mmol/L (ref 135–145)
Total Bilirubin: 0.6 mg/dL (ref 0.3–1.2)
Total Protein: 6.8 g/dL (ref 6.5–8.1)

## 2021-05-04 LAB — CBC WITH DIFFERENTIAL (CANCER CENTER ONLY)
Abs Immature Granulocytes: 0.08 10*3/uL — ABNORMAL HIGH (ref 0.00–0.07)
Basophils Absolute: 0 10*3/uL (ref 0.0–0.1)
Basophils Relative: 1 %
Eosinophils Absolute: 0.2 10*3/uL (ref 0.0–0.5)
Eosinophils Relative: 4 %
HCT: 40.9 % (ref 39.0–52.0)
Hemoglobin: 14.5 g/dL (ref 13.0–17.0)
Immature Granulocytes: 1 %
Lymphocytes Relative: 8 %
Lymphs Abs: 0.5 10*3/uL — ABNORMAL LOW (ref 0.7–4.0)
MCH: 33 pg (ref 26.0–34.0)
MCHC: 35.5 g/dL (ref 30.0–36.0)
MCV: 93.2 fL (ref 80.0–100.0)
Monocytes Absolute: 0.4 10*3/uL (ref 0.1–1.0)
Monocytes Relative: 7 %
Neutro Abs: 4.4 10*3/uL (ref 1.7–7.7)
Neutrophils Relative %: 79 %
Platelet Count: 191 10*3/uL (ref 150–400)
RBC: 4.39 MIL/uL (ref 4.22–5.81)
RDW: 12.5 % (ref 11.5–15.5)
WBC Count: 5.6 10*3/uL (ref 4.0–10.5)
nRBC: 0 % (ref 0.0–0.2)

## 2021-05-04 LAB — LACTATE DEHYDROGENASE: LDH: 164 U/L (ref 98–192)

## 2021-05-04 NOTE — Progress Notes (Signed)
[name] [phone]   [phone]  PROGRESS NOTE  Patient Care Team: Tysinger, Leward Quan as PCP - General (Family Medicine)  Hematological/Oncological History # Follicular Lymphoma. Stage III 1) 01/07/2020: CT Neck W contrast showed cervical lymphadenopathy bilaterally, left greater than right. Numerous enlarged lymph nodes are present 2)  01/22/2020: [place] Core biopsy performed which revealed overall features consistent with involvement by non-Hodgkin's B-cell lymphoma and the morphologic and phenotypic findings favor follicular lymphoma.  3) 01/31/2020: establish care with Dr. Lorenso Courier  4) 02/12/2020: PET CT scan demonstrates bulky adenopathy in the neck, chest, abdomen and pelvis and diffuse skeletal uptake is suspicious for (but not diagnostic) of marrow involvement. 5) 04/30/2020: Cycle 1 Day 1 of Bendamustine/Rituximab 6) 05/29/2020: Cycle 2 Day 1 of Bendamustine/Rituximab 7) 06/25/2020: Cycle 3 Day 1 of Bendamustine/Rituximab 8) 07/23/2020: Cycle 4 Day 1 of Bendamustine/Rituximab 9) 08/25/2020: Cycle 5 Day 1 of Bendamustine/Rituximab 10)09/17/2020: Cycle 6 Day 1 of Bendamustine/Rituximab 11) 10/26/2020: PET CT scan shows partial response (Deauville Score 3 in lymph nodes but some residual FDG avidity in the bone marrow).  12) 04/27/2021: CT scan showed interval decreased adenopathy below the diaphragm, consistent with treatment response. No new or enlarging suspicious lymph nodes  Interval History:  Jorge Gould 65 y.o. male with medical history significant for Stage III follicular lymphoma who presents for a follow up visit. The patient's last visit was on 02/02/2021. In the interim since the last visit Jorge Gould had a CT scan on 04/27/2021 which shows no evidence of residual or recurrent disease.  On exam today Jorge Gould reports that he has been well in the interim since our last visit.  He is accompanied by his wife.  He reports that he has been  working every day and is working at 100% capacity.  He continues to work hard and has no limitations in his physical activity.  He does his best to stay hydrated.  Unfortunately his weight is increasing which he attributes to eating fast food on the job.  He denies any fevers, chills, sweats, nausea, vomiting or diarrhea.  A full 10 point ROS is listed below.  MEDICAL HISTORY:  Past Medical History:  Diagnosis Date   Cancer ([place]) 0109   follicular lymphoma   Obesity     SURGICAL HISTORY: Past Surgical History:  Procedure Laterality Date   ANKLE FRACTURE SURGERY  2016   right, trauma repair after fall down stairs   HERNIA REPAIR     umbilical   IR IMAGING GUIDED PORT INSERTION  04/09/2020   IR REMOVAL TUN ACCESS W/ PORT W/O FL MOD SED  11/27/2020   TONSILLECTOMY      SOCIAL HISTORY: Social History   Socioeconomic History   Marital status: Married    Spouse name: Not on file   Number of children: Not on file   Years of education: Not on file   Highest education level: Not on file  Occupational History   Not on file  Tobacco Use   Smoking status: Never   Smokeless tobacco: Never  Vaping Use   Vaping Use: Never used  Substance and Sexual Activity   Alcohol use: Yes    Alcohol/week: 3.0 standard drinks    Types: 3 Shots of liquor per week   Drug use: Not Currently   Sexual activity: Not on file  Other Topics Concern   Not on file  Social History Narrative   Engaged, plan to marry in March 2021, been together  since 2014.  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   Most exercise on the job, working 7 days per week.   No children, but fiance has 5 children.    11/2019   Social Determinants of Health   Financial Resource Strain: Not on file  Food Insecurity: Not on file  Transportation Needs: Not on file  Physical Activity: Not on file  Stress: Not on file  Social Connections: Not on file  Intimate Partner Violence: Not on file    FAMILY HISTORY: Family  History  Problem Relation Age of Onset   Cancer Mother        lung; former smoker   COPD Father        emphysema   Diabetes Maternal Aunt    Heart disease Maternal Uncle    Heart disease Maternal Uncle    Diabetes Maternal Aunt    Stroke Neg Hx    Hyperlipidemia Neg Hx    Hypertension Neg Hx     ALLERGIES:  has No Known Allergies.  MEDICATIONS:  Current Outpatient Medications  Medication Sig Dispense Refill   Naphazoline HCl (CLEAR EYES OP) Place 1 drop into both eyes daily as needed (redness). (Patient not taking: No sig reported)     sildenafil (VIAGRA) 100 MG tablet Take 0.5-1 tablets (50-100 mg total) by mouth daily as needed for erectile dysfunction. 10 tablet 0   No current facility-administered medications for this visit.    REVIEW OF SYSTEMS:   Constitutional: ( - ) fevers, ( - )  chills , ( - ) night sweats Eyes: ( - ) blurriness of vision, ( - ) double vision, ( - ) watery eyes Ears, nose, mouth, throat, and face: ( - ) mucositis, ( - ) sore throat Respiratory: ( - ) cough, ( - ) dyspnea, ( - ) wheezes Cardiovascular: ( - ) palpitation, ( - ) chest discomfort, ( - ) lower extremity swelling Gastrointestinal:  ( - ) nausea, ( - ) heartburn, ( - ) change in bowel habits Skin: ( - ) abnormal skin rashes Lymphatics: ( - ) new lymphadenopathy, ( - ) easy bruising Neurological: ( - ) numbness, ( - ) tingling, ( - ) new weaknesses Behavioral/Psych: ( - ) mood change, ( - ) new changes  All other systems were reviewed with the patient and are negative.  PHYSICAL EXAMINATION: ECOG PERFORMANCE STATUS: 1 - Symptomatic but completely ambulatory  Vitals:   05/04/21 0817  BP: (!) 144/88  Pulse: (!) 50  Resp: 17  Temp: (!) 97.1 F (36.2 C)  SpO2: 99%    Filed Weights   05/04/21 0817  Weight: 243 lb 11.2 oz (110.5 kg)     GENERAL: well appearing middle aged Caucasian male in NAD  SKIN: skin color, texture, turgor are normal, no rashes or significant lesions EYES:  conjunctiva are pink and non-injected, sclera clear NECK: supple, non-tender LYMPH:  no palpable lymphadenopathy in the cervical chain LUNGS: clear to auscultation and percussion with normal breathing effort HEART: regular rate & rhythm and no murmurs and no lower extremity edema Musculoskeletal: no cyanosis of digits and no clubbing  PSYCH: alert & oriented x 3, fluent speech NEURO: no focal motor/sensory deficits  LABORATORY DATA:  I have reviewed the data as listed CBC Latest Ref Rng & Units 05/04/2021 02/02/2021 11/27/2020  WBC 4.0 - 10.5 K/uL 5.6 5.5 4.9  Hemoglobin 13.0 - 17.0 g/dL 14.5 13.8 14.6  Hematocrit 39.0 - 52.0 % 40.9  39.2 41.6  Platelets 150 - 400 K/uL 191 199 197    CMP Latest Ref Rng & Units 05/04/2021 04/27/2021 02/02/2021  Glucose 70 - 99 mg/dL 109(H) - 104(H)  BUN 8 - 23 mg/dL 21 - 23  Creatinine 0.61 - 1.24 mg/dL 0.99 1.00 1.13  Sodium 135 - 145 mmol/L 141 - 142  Potassium 3.5 - 5.1 mmol/L 4.2 - 5.0  Chloride 98 - 111 mmol/L 107 - 104  CO2 22 - 32 mmol/L 24 - 27  Calcium 8.9 - 10.3 mg/dL 9.3 - 9.1  Total Protein 6.5 - 8.1 g/dL 6.8 - 6.8  Total Bilirubin 0.3 - 1.2 mg/dL 0.6 - 0.5  Alkaline Phos 38 - 126 U/L 91 - 100  AST 15 - 41 U/L 19 - 15  ALT 0 - 44 U/L 22 - 21   RADIOGRAPHIC STUDIES: I have personally reviewed the radiological images as listed and agreed with the findings in the report: Marked response in the patient's lymphadenopathy and decrease in his FDG avidity.  Bone marrow involvement also appears considerably less.  Overall consistent with a partial response.  CT CHEST ABDOMEN PELVIS W CONTRAST  Result Date: 04/27/2021 CLINICAL DATA:  History of follicular lymphoma, surveillance assessment for progression of disease. EXAM: CT CHEST, ABDOMEN, AND PELVIS WITH CONTRAST TECHNIQUE: Multidetector CT imaging of the chest, abdomen and pelvis was performed following the standard protocol during bolus administration of intravenous contrast. CONTRAST:  194mL  OMNIPAQUE IOHEXOL 300 MG/ML  SOLN COMPARISON:  PET-CT October 24, 2020 and February 12, 2020 FINDINGS: CT CHEST FINDINGS Cardiovascular: Aortic atherosclerosis. Stable ectasia of the ascending aorta measuring 4 cm. No central pulmonary embolus. Coronary artery calcifications. Normal size heart. No significant pericardial effusion/thickening. Mediastinum/Nodes: No enlarged supraclavicular lymph nodes. No discrete thyroid nodularity. No pathologically enlarged mediastinal, hilar or axillary lymph nodes. The trachea and esophagus are grossly unremarkable. Lungs/Pleura: Mild centrilobular emphysema. Mild biapical pleuroparenchymal scarring. No suspicious pulmonary nodules or masses. No pleural effusion. No pneumothorax. Musculoskeletal: No suspicious chest wall lesions. Degenerative changes bilateral glenohumeral, acromioclavicular and sternoclavicular joints. Thoracic spondylosis. CT ABDOMEN PELVIS FINDINGS Hepatobiliary: No suspicious hepatic lesion. Gallbladder is unremarkable. No biliary ductal dilation. Pancreas: Within normal limits. Spleen: Normal in size without focal abnormality. Adrenals/Urinary Tract: Adrenal glands are unremarkable. Kidneys are normal, without renal calculi, solid enhancing lesion, or hydronephrosis. Bladder is unremarkable for degree of distension. Stomach/Bowel: Stomach is decompressed limiting evaluation. No pathologic dilation of small bowel. The appendix and terminal ileum are grossly unremarkable. No suspicious bowel wall thickening or mass like lesions. Vascular/Lymphatic: Aortic atherosclerosis without abdominal aortic aneurysm. Decreased size of the confluent retroperitoneal adenopathy involving the left periaortic and aortocaval chains which was mildly metabolic on most recent PET-CT, now measuring 8.4 x 3.3 cm on image 75/2 previously 10.3 x 4.5 cm. Stable to slightly decreased central mesenteric adenopathy with the indexed node now measuring up to 8 mm on image 86/2 previously 11  mm. Decreased ill-defined confluent left common iliac chain soft tissue/adenopathy which was mildly metabolic on prior PET-CT now measuring 1.9 cm in short axis on image 101/2 previously 2.2 cm. No new or enlarging suspicious lymph nodes in the abdomen or pelvis identified. Reproductive: Dystrophic prostatic calcifications. Other: No abdominopelvic ascites. Small fat containing ventral hernia. Small fat containing left inguinal hernia. Musculoskeletal: No aggressive lytic or blastic lesions of bone. IMPRESSION: 1. Interval decreased adenopathy below the diaphragm, consistent with treatment response. No new or enlarging suspicious lymph nodes in the abdomen or pelvis. 2. No splenomegaly  or splenic lesions. 3. No evidence of disease above the diaphragm. 4. Stable ectasia of the ascending aorta measuring 4 cm. Recommend annual imaging followup by CTA or MRA. However, in this patient continued surveillance with oncologic imaging is sufficient. This recommendation follows 2010 ACCF/AHA/AATS/ACR/ASA/SCA/SCAI/SIR/STS/SVM Guidelines for the Diagnosis and Management of Patients with Thoracic Aortic Disease. Circulation. 2010; 121: V564-P329. Aortic aneurysm NOS (ICD10-I71.9) 5. Emphysema and aortic atherosclerosis. Aortic Atherosclerosis (ICD10-I70.0) and Emphysema (ICD10-J43.9). Electronically Signed   By: Dahlia Bailiff MD   On: 04/27/2021 14:16    ASSESSMENT & PLAN Jorge Gould 65 y.o. male with medical history significant for newly diagnosed follicular lymphoma who presents for a follow up visit. He completed 6 cycles of Bendamustine/Rituximab therapy.   On exam today  Mr. Merica reports that he feels well overall.  He has been gaining weight but has no other concerning symptoms.  Previously we discussed the results of his PET CT scan and the need for continued surveillance.  I noted that it was a partial response and that at this time we can continue to observe and consider retreatment in the event that the  lymphadenopathy were to flare again.  The patient and spouse voiced their understanding of the plan.  FLIPI-2 Score: 4  (high risk). Initial indication for treatment: bulky lymphadenopathy with 15 x 9 cm conglomerate of lymph nodes in the abdomen.  The regimen of choice for this patient was Bendamustine/Rituximab. Treatment will consist of 6 cycles, each lasting 28 days. The patient will receive Bendamustine 90mg /m2 IV on Day 1 and Day 2 in addition to Rituximab 375mg /m2 IV on Day 1. This was administered with curative intent. Administered x 6 cycles. Repeat PET CT scan on 10/26/2020 showed a partial response. OK to enter observation.   # Follicular Lymphoma. Stage III --patient completed Cycle 6 of R-Benda therapy. This was administered with curative intent.  --PET CT scan after 6 cycles of R-Benda showed substantial partial response. Residual mild hypermetabolism within the retroperitoneal and left common iliac chains (Deauville score 3). No residual metabolically active adenopathy in the neck or chest --per [place] Criteria, patient is a Partial Response.  --based on NCCN recommendations, patient is appropriate for observation with q 6 month CT scans x 2 years and q 3-6 month f/u clinic visits.  --RTC in 6 months for return visit with CT scan  #Fertility Preservation  --referred [facility] to [facility] for F[facility] Clinic and Sperm preservation.  --unfortunately patient was noted to be sterile on exam, no sperm to freeze.   #Supportive Care --no longer required. --port removed   Orders Placed This Encounter  Procedures   CT CHEST ABDOMEN PELVIS W CONTRAST    Standing Status:   Future    Standing Expiration Date:   05/04/2022    Order Specific Question:   If indicated for the ordered procedure, I authorize the administration of contrast media per Radiology protocol    Answer:   Yes    Order Specific Question:   Preferred imaging location?    Answer:   [facility]    Order Specific  Question:   Is Oral Contrast requested for this exam?    Answer:   Yes, Per Radiology protocol    Order Specific Question:   Reason for Exam (SYMPTOM  OR DIAGNOSIS REQUIRED)    Answer:   Follicular lymphoma s/p treatment. Assess for relapse/recurrence    All questions were answered. The patient knows to call the clinic with any problems, questions or  concerns.  A total of more than 30 minutes were spent on this encounter and over half of that time was spent on counseling and coordination of care as outlined above.   Ledell Peoples, MD Department of Hematology/Oncology [place] at [facility] Phone: [phone] Pager: [phone] Email: [email or web address]  05/04/2021 9:25 AM

## 2021-05-08 ENCOUNTER — Telehealth: Payer: Self-pay | Admitting: Hematology and Oncology

## 2021-05-08 NOTE — Telephone Encounter (Signed)
Sch per 06/27 los, pt aware 

## 2021-10-26 ENCOUNTER — Inpatient Hospital Stay: Payer: Medicare Other | Attending: Hematology and Oncology

## 2021-10-26 ENCOUNTER — Ambulatory Visit (HOSPITAL_COMMUNITY)
Admission: RE | Admit: 2021-10-26 | Discharge: 2021-10-26 | Disposition: A | Discharge status: Home / Self Care | Payer: Medicare Other | Source: Ambulatory Visit | Attending: Hematology and Oncology | Admitting: Hematology and Oncology

## 2021-10-26 ENCOUNTER — Other Ambulatory Visit: Payer: Self-pay

## 2021-10-26 DIAGNOSIS — Z9221 Personal history of antineoplastic chemotherapy: Secondary | ICD-10-CM | POA: Insufficient documentation

## 2021-10-26 DIAGNOSIS — C8290 Follicular lymphoma, unspecified, unspecified site: Secondary | ICD-10-CM | POA: Insufficient documentation

## 2021-10-26 DIAGNOSIS — Z79899 Other long term (current) drug therapy: Secondary | ICD-10-CM | POA: Insufficient documentation

## 2021-10-26 DIAGNOSIS — C8233 Follicular lymphoma grade IIIa, intra-abdominal lymph nodes: Secondary | ICD-10-CM

## 2021-10-26 LAB — CMP (CANCER CENTER ONLY)
ALT: 19 U/L (ref 0–44)
AST: 17 U/L (ref 15–41)
Albumin: 4.2 g/dL (ref 3.5–5.0)
Alkaline Phosphatase: 83 U/L (ref 38–126)
Anion gap: 10 (ref 5–15)
BUN: 20 mg/dL (ref 8–23)
CO2: 23 mmol/L (ref 22–32)
Calcium: 9.3 mg/dL (ref 8.9–10.3)
Chloride: 106 mmol/L (ref 98–111)
Creatinine: 1.07 mg/dL (ref 0.61–1.24)
GFR, Estimated: >60 mL/min (ref >60)
Glucose, Bld: 96 mg/dL (ref 70–99)
Potassium: 3.9 mmol/L (ref 3.5–5.1)
Sodium: 139 mmol/L (ref 135–145)
Total Bilirubin: 0.7 mg/dL (ref 0.3–1.2)
Total Protein: 7.3 g/dL (ref 6.5–8.1)

## 2021-10-26 LAB — CBC WITH DIFFERENTIAL (CANCER CENTER ONLY)
Abs Immature Granulocytes: 0.07 10*3/uL (ref 0.00–0.07)
Basophils Absolute: 0 10*3/uL (ref 0.0–0.1)
Basophils Relative: 1 %
Eosinophils Absolute: 0.2 10*3/uL (ref 0.0–0.5)
Eosinophils Relative: 4 %
HCT: 41.1 % (ref 39.0–52.0)
Hemoglobin: 14.5 g/dL (ref 13.0–17.0)
Immature Granulocytes: 1 %
Lymphocytes Relative: 13 %
Lymphs Abs: 0.8 10*3/uL (ref 0.7–4.0)
MCH: 32.6 pg (ref 26.0–34.0)
MCHC: 35.3 g/dL (ref 30.0–36.0)
MCV: 92.4 fL (ref 80.0–100.0)
Monocytes Absolute: 0.4 10*3/uL (ref 0.1–1.0)
Monocytes Relative: 7 %
Neutro Abs: 4.6 10*3/uL (ref 1.7–7.7)
Neutrophils Relative %: 74 %
Platelet Count: 178 10*3/uL (ref 150–400)
RBC: 4.45 MIL/uL (ref 4.22–5.81)
RDW: 12.1 % (ref 11.5–15.5)
WBC Count: 6.2 10*3/uL (ref 4.0–10.5)
nRBC: 0 % (ref 0.0–0.2)

## 2021-10-26 LAB — LACTATE DEHYDROGENASE: LDH: 175 U/L (ref 98–192)

## 2021-10-26 MED ORDER — IOHEXOL 350 MG/ML SOLN
80.0000 mL | Freq: Once | INTRAVENOUS | Status: AC | PRN
  Administered 2021-10-26: 16:00:00 80 mL via INTRAVENOUS

## 2021-10-26 MED ORDER — SODIUM CHLORIDE (PF) 0.9 % IJ SOLN
INTRAMUSCULAR | Status: AC
  Filled 2021-10-26: qty 50

## 2021-10-28 ENCOUNTER — Inpatient Hospital Stay: Payer: Medicare Other

## 2021-10-28 ENCOUNTER — Inpatient Hospital Stay: Payer: Medicare Other | Admitting: Hematology and Oncology

## 2021-10-30 ENCOUNTER — Inpatient Hospital Stay: Payer: Medicare Other | Admitting: Hematology and Oncology

## 2021-10-30 ENCOUNTER — Encounter: Payer: Self-pay | Admitting: Hematology and Oncology

## 2021-10-30 ENCOUNTER — Inpatient Hospital Stay: Payer: Medicare Other

## 2021-10-30 ENCOUNTER — Other Ambulatory Visit: Payer: Self-pay

## 2021-10-30 VITALS — BP 141/81 | HR 58 | Temp 97.6°F | Resp 17 | Ht 71.5 in | Wt 244.4 lb

## 2021-10-30 DIAGNOSIS — C8293 Follicular lymphoma, unspecified, intra-abdominal lymph nodes: Secondary | ICD-10-CM

## 2021-10-30 DIAGNOSIS — Z9221 Personal history of antineoplastic chemotherapy: Secondary | ICD-10-CM | POA: Diagnosis not present

## 2021-10-30 DIAGNOSIS — C8290 Follicular lymphoma, unspecified, unspecified site: Secondary | ICD-10-CM | POA: Diagnosis not present

## 2021-10-30 DIAGNOSIS — C8291 Follicular lymphoma, unspecified, lymph nodes of head, face, and neck: Secondary | ICD-10-CM | POA: Diagnosis not present

## 2021-10-30 DIAGNOSIS — R591 Generalized enlarged lymph nodes: Secondary | ICD-10-CM

## 2021-10-30 DIAGNOSIS — Z79899 Other long term (current) drug therapy: Secondary | ICD-10-CM | POA: Diagnosis not present

## 2021-10-30 NOTE — Progress Notes (Signed)
[place] [phone]   [phone]  PROGRESS NOTE  Patient Care Team: Tysinger, Leward Quan as PCP - General (Family Medicine)  Hematological/Oncological History # Follicular Lymphoma. Stage III 1) 01/07/2020: CT Neck W contrast showed cervical lymphadenopathy bilaterally, left greater than right. Numerous enlarged lymph nodes are present 2)  01/22/2020: [place] Core biopsy performed which revealed overall features consistent with involvement by non-Hodgkin's B-cell lymphoma and the morphologic and phenotypic findings favor follicular lymphoma.  3) 01/31/2020: establish care with Dr. Lorenso Courier  4) 02/12/2020: PET CT scan demonstrates bulky adenopathy in the neck, chest, abdomen and pelvis and diffuse skeletal uptake is suspicious for (but not diagnostic) of marrow involvement. 5) 04/30/2020: Cycle 1 Day 1 of Bendamustine/Rituximab 6) 05/29/2020: Cycle 2 Day 1 of Bendamustine/Rituximab 7) 06/25/2020: Cycle 3 Day 1 of Bendamustine/Rituximab 8) 07/23/2020: Cycle 4 Day 1 of Bendamustine/Rituximab 9) 08/25/2020: Cycle 5 Day 1 of Bendamustine/Rituximab 10)09/17/2020: Cycle 6 Day 1 of Bendamustine/Rituximab 11) 10/26/2020: PET CT scan shows partial response (Deauville Score 3 in lymph nodes but some residual FDG avidity in the bone marrow).  12) 04/27/2021: CT scan showed interval decreased adenopathy below the diaphragm, consistent with treatment response. No new or enlarging suspicious lymph nodes 13) 10/27/2021: CT scan showed increased left inguinal adenopathy, including a 1.6 cm left inguinal lymph node which was previously 0.9 cm in short axis.  Interval History:  Jorge Gould 65 y.o. male with medical history significant for Stage III follicular lymphoma who presents for a follow up visit. The patient's last visit was on 05/04/2021. In the interim since the last visit Jorge Gould had a CT scan on 10/27/2021 which shows modest increase in size of inguinal  lymphadenopathy.   On exam today Jorge Gould is accompanied by his wife.  He reports he has been well overall in the interim since her last visit.  He has developed a lymph node on his left side behind his ear.  He reports he notes about 1 month ago and since that time it has "gotten a little bigger".  He notes he does not feel any lymph nodes in his groin.  He notes that occasionally when he is working he feels a sharp pain in the ribs on his left side.  He currently denies any fevers, chills, sweats, nausea, vomiting or diarrhea.  His weight has been steady and has had no other changes in his health.  A full 10 point ROS is listed below.   MEDICAL HISTORY:  Past Medical History:  Diagnosis Date   Cancer ([place]) 3382   follicular lymphoma   Obesity     SURGICAL HISTORY: Past Surgical History:  Procedure Laterality Date   ANKLE FRACTURE SURGERY  2016   right, trauma repair after fall down stairs   HERNIA REPAIR     umbilical   IR IMAGING GUIDED PORT INSERTION  04/09/2020   IR REMOVAL TUN ACCESS W/ PORT W/O FL MOD SED  11/27/2020   TONSILLECTOMY      SOCIAL HISTORY: Social History   Socioeconomic History   Marital status: Married    Spouse name: Not on file   Number of children: Not on file   Years of education: Not on file   Highest education level: Not on file  Occupational History   Not on file  Tobacco Use   Smoking status: Never   Smokeless tobacco: Never  Vaping Use   Vaping Use: Never used  Substance and Sexual Activity   Alcohol  use: Yes    Alcohol/week: 3.0 standard drinks    Types: 3 Shots of liquor per week   Drug use: Not Currently   Sexual activity: Not on file  Other Topics Concern   Not on file  Social History Narrative   Engaged, plan to marry in March 2021, been together since 2014.  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   Most exercise on the job, working 7 days per week.   No children, but fiance has 5 children.    11/2019   Social  Determinants of Health   Financial Resource Strain: Not on file  Food Insecurity: Not on file  Transportation Needs: Not on file  Physical Activity: Not on file  Stress: Not on file  Social Connections: Not on file  Intimate Partner Violence: Not on file    FAMILY HISTORY: Family History  Problem Relation Age of Onset   Cancer Mother        lung; former smoker   COPD Father        emphysema   Diabetes Maternal Aunt    Heart disease Maternal Uncle    Heart disease Maternal Uncle    Diabetes Maternal Aunt    Stroke Neg Hx    Hyperlipidemia Neg Hx    Hypertension Neg Hx     ALLERGIES:  has No Known Allergies.  MEDICATIONS:  Current Outpatient Medications  Medication Sig Dispense Refill   Naphazoline HCl (CLEAR EYES OP) Place 1 drop into both eyes daily as needed (redness). (Patient not taking: No sig reported)     sildenafil (VIAGRA) 100 MG tablet Take 0.5-1 tablets (50-100 mg total) by mouth daily as needed for erectile dysfunction. 10 tablet 0   No current facility-administered medications for this visit.    REVIEW OF SYSTEMS:   Constitutional: ( - ) fevers, ( - )  chills , ( - ) night sweats Eyes: ( - ) blurriness of vision, ( - ) double vision, ( - ) watery eyes Ears, nose, mouth, throat, and face: ( - ) mucositis, ( - ) sore throat Respiratory: ( - ) cough, ( - ) dyspnea, ( - ) wheezes Cardiovascular: ( - ) palpitation, ( - ) chest discomfort, ( - ) lower extremity swelling Gastrointestinal:  ( - ) nausea, ( - ) heartburn, ( - ) change in bowel habits Skin: ( - ) abnormal skin rashes Lymphatics: ( - ) new lymphadenopathy, ( - ) easy bruising Neurological: ( - ) numbness, ( - ) tingling, ( - ) new weaknesses Behavioral/Psych: ( - ) mood change, ( - ) new changes  All other systems were reviewed with the patient and are negative.  PHYSICAL EXAMINATION: ECOG PERFORMANCE STATUS: 1 - Symptomatic but completely ambulatory  Vitals:   10/30/21 0857  BP: (!) 141/81   Pulse: (!) 58  Resp: 17  Temp: 97.6 F (36.4 C)  SpO2: 97%    Filed Weights   10/30/21 0857  Weight: 244 lb 6.4 oz (110.9 kg)     GENERAL: well appearing middle aged Caucasian male in NAD  SKIN: skin color, texture, turgor are normal, no rashes or significant lesions EYES: conjunctiva are pink and non-injected, sclera clear NECK: supple, non-tender LYMPH:  no palpable lymphadenopathy in the cervical chain LUNGS: clear to auscultation and percussion with normal breathing effort HEART: regular rate & rhythm and no murmurs and no lower extremity edema Musculoskeletal: no cyanosis of digits and no clubbing  PSYCH: alert &  oriented x 3, fluent speech NEURO: no focal motor/sensory deficits  LABORATORY DATA:  I have reviewed the data as listed CBC Latest Ref Rng & Units 10/26/2021 05/04/2021 02/02/2021  WBC 4.0 - 10.5 K/uL 6.2 5.6 5.5  Hemoglobin 13.0 - 17.0 g/dL 14.5 14.5 13.8  Hematocrit 39.0 - 52.0 % 41.1 40.9 39.2  Platelets 150 - 400 K/uL 178 191 199    CMP Latest Ref Rng & Units 10/26/2021 05/04/2021 04/27/2021  Glucose 70 - 99 mg/dL 96 109(H) -  BUN 8 - 23 mg/dL 20 21 -  Creatinine 0.61 - 1.24 mg/dL 1.07 0.99 1.00  Sodium 135 - 145 mmol/L 139 141 -  Potassium 3.5 - 5.1 mmol/L 3.9 4.2 -  Chloride 98 - 111 mmol/L 106 107 -  CO2 22 - 32 mmol/L 23 24 -  Calcium 8.9 - 10.3 mg/dL 9.3 9.3 -  Total Protein 6.5 - 8.1 g/dL 7.3 6.8 -  Total Bilirubin 0.3 - 1.2 mg/dL 0.7 0.6 -  Alkaline Phos 38 - 126 U/L 83 91 -  AST 15 - 41 U/L 17 19 -  ALT 0 - 44 U/L 19 22 -   RADIOGRAPHIC STUDIES: I have personally reviewed the radiological images as listed and agreed with the findings in the report: Marked response in the patient's lymphadenopathy and decrease in his FDG avidity.  Bone marrow involvement also appears considerably less.  Overall consistent with a partial response.  CT CHEST ABDOMEN PELVIS W CONTRAST  Result Date: 10/27/2021 CLINICAL DATA:  Follicular lymphoma restaging  EXAM: CT CHEST, ABDOMEN, AND PELVIS WITH CONTRAST TECHNIQUE: Multidetector CT imaging of the chest, abdomen and pelvis was performed following the standard protocol during bolus administration of intravenous contrast. CONTRAST:  11mL OMNIPAQUE IOHEXOL 350 MG/ML SOLN COMPARISON:  04/27/2021 FINDINGS: CT CHEST FINDINGS Cardiovascular: Stable ascending thoracic aortic aneurysm, 4.0 cm in diameter. Coronary, aortic arch, and branch vessel atherosclerotic vascular disease. Calcifications are noted along the aortic valve. Mediastinum/Nodes: Unremarkable Lungs/Pleura: Mild biapical pleuroparenchymal scarring. Minimal centrilobular emphysema. Musculoskeletal: Mild lower thoracic spondylosis. CT ABDOMEN PELVIS FINDINGS Hepatobiliary: Unremarkable Pancreas: Unremarkable Spleen: No splenomegaly or focal splenic lesion. Adrenals/Urinary Tract: Unremarkable Stomach/Bowel: Suspected 1.8 by 0.8 cm lipoma near the junction of the duodenum and jejunum on image 73 series 2, stable. Vascular/Lymphatic: Hazy rind of retroperitoneal/periaortic density likely residua from prior lymphoma, for example surrounding the renal arteries and much of the abdominal aorta. There is some individually identifiable lymph nodes including a left periaortic node just below the renal vessels measuring 1.5 cm in short axis on image 77 series 2 (previously difficult to measure but probably about 1.5 cm) and a left periaortic node just above the level of the bifurcation measuring 1.0 cm in short axis on image 89 series 2 (formerly the same). However, there has been some enlargement of inguinal lymph nodes including a 1.6 cm left inguinal lymph node on image 131 series 2 which previously measured 0.9 cm and which was previously fatty. Adjacent left inguinal lymph node measures 1.8 cm in short axis on image 128 series 2 (formerly 1.2 cm). A left external iliac node measures 0.9 cm in short axis on image 105 of series 2, previously 0.7 cm. There is a mild band  of stranding in the central mesentery, up to about 0.7 cm in thickness on image 82 series 2, similar to previous. Aortic Atherosclerosis (ICD10-I70.0). Reproductive: Unremarkable Other: No supplemental non-categorized findings. Musculoskeletal: Mild fatty prominence of the left spermatic cord. IMPRESSION: 1. Increased left inguinal adenopathy, including  a 1.6 cm left inguinal lymph node which was previously 0.9 cm in short axis. 2. Stable retroperitoneal rind of hazy density and scattered retroperitoneal lymph nodes. Stable hazy band of density in the central mesentery likely from prior lymphoma. 3. No adenopathy is observed in the chest. 4. Stable ascending thoracic aortic aneurysm at 4.0 cm in diameter. This might be followed via oncology imaging, otherwise recommend annual imaging followup by CTA or MRA. This recommendation follows 2010 ACCF/AHA/AATS/ACR/ASA/SCA/SCAI/SIR/STS/SVM Guidelines for the Diagnosis and Management of Patients with Thoracic Aortic Disease. Circulation. 2010; 121: C127-N170. Aortic aneurysm NOS (ICD10-I71.9) 5. Other imaging findings of potential clinical significance: Aortic Atherosclerosis (ICD10-I70.0) and Emphysema (ICD10-J43.9). Coronary atherosclerosis. Aortic valve calcifications. Small lipoma along the lumen of the distal duodenum. Electronically Signed   By: Van Clines M.D.   On: 10/27/2021 15:08    ASSESSMENT & PLAN Jorge Gould 65 y.o. male with medical history significant for newly diagnosed follicular lymphoma who presents for a follow up visit. He completed 6 cycles of Bendamustine/Rituximab therapy.   On exam today  Jorge Gould reports that he feels well overall.  He has been gaining weight but has no other concerning symptoms.  Previously we discussed the results of his PET CT scan and the need for continued surveillance.  I noted that it was a partial response and that at this time we can continue to observe and consider retreatment in the event that the  lymphadenopathy were to flare again.  The patient and spouse voiced their understanding of the plan.  FLIPI-2 Score: 4  (high risk). Initial indication for treatment: bulky lymphadenopathy with 15 x 9 cm conglomerate of lymph nodes in the abdomen.  The regimen of choice for this patient was Bendamustine/Rituximab. Treatment will consist of 6 cycles, each lasting 28 days. The patient will receive Bendamustine 90mg /m2 IV on Day 1 and Day 2 in addition to Rituximab 375mg /m2 IV on Day 1. This was administered with curative intent. Administered x 6 cycles. Repeat PET CT scan on 10/26/2020 showed a partial response. OK to enter observation.   Today we discussed results of the CT scan showing modest increase in the size of his lymph nodes.  Given that he is not having any cytopenias, focal symptoms, or other issues I would recommend continued observation this time.  The patient was in agreement with this plan moving forward.  # Follicular Lymphoma. Stage III --patient completed Cycle 6 of R-Benda therapy. This was administered with curative intent.  --PET CT scan after 6 cycles of R-Benda showed substantial partial response. Residual mild hypermetabolism within the retroperitoneal and left common iliac chains (Deauville score 3). No residual metabolically active adenopathy in the neck or chest --per [place] Criteria, patient is a Partial Response.  Plan:  --based on NCCN recommendations, patient is appropriate for observation with q 6 month CT scans x 2 years and q 3-6 month f/u clinic visits.  --CT scan on 10/27/2021 showed increased left inguinal adenopathy, modestly increased from prior. Continue monitoring as there is no clear indication for treatment at this time.  There is no bulk, symptoms, or cytopenias. --RTC in 6 months for return visit with repeat CT scan   No orders of the defined types were placed in this encounter.   All questions were answered. The patient knows to call the clinic with  any problems, questions or concerns.  A total of more than 30 minutes were spent on this encounter and over half of that time was spent on  counseling and coordination of care as outlined above.   Ledell Peoples, MD Department of Hematology/Oncology [place] [facility] Phone: [phone] Pager: [phone] Email: [email or web address]  10/30/2021 9:02 AM

## 2022-04-20 ENCOUNTER — Telehealth: Payer: Self-pay | Admitting: Hematology and Oncology

## 2022-04-20 NOTE — Telephone Encounter (Signed)
Per 6/13 provider reschedule called and left message for pt about reschedule  details and call back number were left

## 2022-04-21 ENCOUNTER — Ambulatory Visit (HOSPITAL_COMMUNITY)
Admission: RE | Admit: 2022-04-21 | Discharge: 2022-04-21 | Disposition: A | Discharge status: Home / Self Care | Payer: Medicare Other | Source: Ambulatory Visit | Attending: Internal Medicine | Admitting: Internal Medicine

## 2022-04-21 ENCOUNTER — Encounter: Payer: Self-pay | Admitting: Hematology and Oncology

## 2022-04-21 ENCOUNTER — Other Ambulatory Visit: Payer: Self-pay

## 2022-04-21 ENCOUNTER — Ambulatory Visit (INDEPENDENT_AMBULATORY_CARE_PROVIDER_SITE_OTHER): Payer: Medicare Other

## 2022-04-21 ENCOUNTER — Encounter (HOSPITAL_COMMUNITY): Payer: Self-pay

## 2022-04-21 VITALS — BP 150/93 | HR 71 | Temp 98.6°F | Resp 20

## 2022-04-21 DIAGNOSIS — R059 Cough, unspecified: Secondary | ICD-10-CM

## 2022-04-21 DIAGNOSIS — J069 Acute upper respiratory infection, unspecified: Secondary | ICD-10-CM | POA: Diagnosis not present

## 2022-04-21 MED ORDER — PROMETHAZINE-DM 6.25-15 MG/5ML PO SYRP: 2.5000 mL | ORAL_SOLUTION | Freq: Four times a day (QID) | ORAL | 0 refills | Status: DC | PRN

## 2022-04-21 MED ORDER — ACETAMINOPHEN 500 MG PO TABS: 1000.0000 mg | ORAL_TABLET | Freq: Four times a day (QID) | ORAL | 0 refills | Status: DC | PRN

## 2022-04-21 MED ORDER — GUAIFENESIN ER 1200 MG PO TB12: 1200.0000 mg | ORAL_TABLET | Freq: Two times a day (BID) | ORAL | 0 refills | Status: DC

## 2022-04-21 NOTE — Discharge Instructions (Addendum)
Your EKG is normal.  Your chest x-ray is also normal.  You likely have a viral upper respiratory infection.  Continue taking guaifenesin (Mucinex) twice daily for the next few days to help with nasal congestion and cough.  I have given you a prescription for a stronger cough medicine to take at night that will make you sleepy but will help with your cough.  Do not take this medication and drive or drink alcohol due to drowsiness side effect.  Your leg swelling needs to be further evaluated by cardiologist.  Wear compression socks in the meantime to help with the leg swelling and elevate your legs.  Call the cardiologist listed on your paperwork (Dr. Angelena Form) to schedule an appointment for further evaluation.  Call your primary care provider Chana Bode to schedule an annual wellness visit.  Call the orthopedist listed on your paperwork for further evaluation of your left ankle.  If you develop any new or worsening symptoms or do not improve in the next 2 to 3 days, please return.  If your symptoms are severe, please go to the emergency room.  Follow-up with your primary care provider for further evaluation and management of your symptoms as well as ongoing wellness visits.  I hope you feel better!

## 2022-04-21 NOTE — ED Provider Notes (Signed)
[place]    CSN: [id number] Arrival date & time: 04/21/22  1132      History   Chief Complaint Chief Complaint  Patient presents with   Cough    Entered by patient   Appointment    12:00    HPI Jorge Gould is a 66 y.o. male.   Patient presents to urgent care for evaluation of his cough that initially started 3 weeks ago, improved after 2 weeks, and returned 3 to 4 days ago.  He is also reporting nasal congestion and fever/chills at home.  He has taken Tylenol for his symptoms at home with improvement.  He denies current and former use of tobacco products.  Denies history of respiratory and cardiac problems.  Cough started out as dry, but patient is now coughing up green/yellow phlegm.  He has been taking Mucinex for his nasal congestion and states that this has been helping.  He reports drinking a significant amount of water during the day with the occasional Dr. Malachi Bonds here and there.  Denies receiving pneumonia vaccine.  Denies exposure to any inhaled irritants or smoke.  He has a history of follicular lymphoma and is currently receiving treatment.  Also reporting increasing pain with walking to the left ankle.  He sustained an injury to this ankle 4 years ago and the ankle had to be surgically repaired.  Over the last couple of weeks, he has noticed increased swelling and pain to the ankle at the end of the day after ambulating.  He has never seen a cardiologist or had an echocardiogram in the past.  He denies knee pain and calf pain bilaterally.  Denies chest pain, shortness of breath, wheezing, night sweats, dizziness, nausea, vomiting, diarrhea, and urinary symptoms.  No abdominal pain.  He is without pain at this time and denies headache.  Last dose of Tylenol was yesterday.  No other aggravating or relieving factors for symptoms identified at this time.   Cough   Past Medical History:  Diagnosis Date   Cancer ([place]) 0350   follicular lymphoma   Obesity      Patient Active Problem List   Diagnosis Date Noted   Cancer ([place]) 04/27/2021   Erectile dysfunction 05/28/2020   Elevated blood-pressure reading without diagnosis of hypertension 05/28/2020   Port-A-Cath in place 09/38/1829   Follicular lymphoma of intra-abdominal lymph nodes ([place]) 03/28/2020   Abnormal CT scan, neck 01/09/2020   Dyslipidemia 01/09/2020   Screen for colon cancer 11/27/2019   Need for pneumococcal vaccination 11/27/2019   Vaccine counseling 11/27/2019   Lymphadenopathy 11/27/2019   Incisional hernia, without obstruction or gangrene 11/27/2019   Varicose veins of both lower extremities 11/27/2019    Past Surgical History:  Procedure Laterality Date   ANKLE FRACTURE SURGERY  2016   right, trauma repair after fall down stairs   HERNIA REPAIR     umbilical   IR IMAGING GUIDED PORT INSERTION  04/09/2020   IR REMOVAL TUN ACCESS W/ PORT W/O FL MOD SED  11/27/2020   TONSILLECTOMY         Home Medications    Prior to Admission medications   Medication Sig Start Date End Date Taking? Authorizing Provider  acetaminophen (TYLENOL) 500 MG tablet Take 2 tablets (1,000 mg total) by mouth every 6 (six) hours as needed. 04/21/22  Yes Talbot Grumbling, FNP  Guaifenesin 1200 MG TB12 Take 1 tablet (1,200 mg total) by mouth in the morning and at bedtime. 04/21/22  Yes  Talbot Grumbling, FNP  promethazine-dextromethorphan (PROMETHAZINE-DM) 6.25-15 MG/5ML syrup Take 2.5 mLs by mouth 4 (four) times daily as needed for cough. 04/21/22  Yes Anja Neuzil, Stasia Cavalier, FNP  Naphazoline HCl (CLEAR EYES OP) Place 1 drop into both eyes daily as needed (redness). Patient not taking: No sig reported    [provider]  sildenafil (VIAGRA) 100 MG tablet Take 0.5-1 tablets (50-100 mg total) by mouth daily as needed for erectile dysfunction. 05/28/20   Tysinger, Camelia Eng, PA-C    Family History Family History  Problem Relation Age of Onset   Cancer Mother        lung; former  smoker   COPD Father        emphysema   Diabetes Maternal Aunt    Heart disease Maternal Uncle    Heart disease Maternal Uncle    Diabetes Maternal Aunt    Stroke Neg Hx    Hyperlipidemia Neg Hx    Hypertension Neg Hx     Social History Social History   Tobacco Use   Smoking status: Never   Smokeless tobacco: Never  Vaping Use   Vaping Use: Never used  Substance Use Topics   Alcohol use: Yes    Alcohol/week: 3.0 standard drinks of alcohol    Types: 3 Shots of liquor per week   Drug use: Not Currently     Allergies   Patient has no known allergies.   Review of Systems Review of Systems  Respiratory:  Positive for cough.   Per HPI   Physical Exam Triage Vital Signs ED Triage Vitals  Enc Vitals Group     BP 04/21/22 1204 (!) 150/93     Pulse Rate 04/21/22 1204 71     Resp 04/21/22 1204 20     Temp 04/21/22 1204 98.6 F (37 C)     Temp Source 04/21/22 1204 Oral     SpO2 04/21/22 1204 94 %     Weight --      Height --      Head Circumference --      Peak Flow --      Pain Score 04/21/22 1202 6     Pain Loc --      Pain Edu? --      Excl. in [place]? --    No data found.  Updated Vital Signs BP (!) 150/93 (BP Location: Left Arm)   Pulse 71   Temp 98.6 F (37 C) (Oral)   Resp 20   SpO2 94%   Visual Acuity Right Eye Distance:   Left Eye Distance:   Bilateral Distance:    Right Eye Near:   Left Eye Near:    Bilateral Near:     Physical Exam Vitals and nursing note reviewed.  Constitutional:      Appearance: Normal appearance. He is not ill-appearing or toxic-appearing.     Comments: Very pleasant patient sitting on exam in position of comfort table in no acute distress.   HENT:     Head: Normocephalic and atraumatic.     Right Ear: Hearing, tympanic membrane, ear canal and external ear normal.     Left Ear: Hearing, tympanic membrane, ear canal and external ear normal.     Nose: Congestion present.     Right Nostril: No foreign body.     Left  Nostril: No foreign body.     Right Turbinates: Swollen.     Left Turbinates: Swollen.     Right Sinus: No maxillary  sinus tenderness or frontal sinus tenderness.     Left Sinus: No maxillary sinus tenderness or frontal sinus tenderness.     Mouth/Throat:     Lips: Pink.     Mouth: Mucous membranes are moist.     Pharynx: Oropharynx is clear. Uvula midline. Posterior oropharyngeal erythema present.     Comments: Mild erythema to posterior oropharynx with mild amount of white/yellow thin post-nasal drainage. Airway intact. No respiratory distress.  Eyes:     General: Lids are normal. Vision grossly intact. Gaze aligned appropriately.     Extraocular Movements: Extraocular movements intact.     Conjunctiva/sclera: Conjunctivae normal.  Cardiovascular:     Rate and Rhythm: Normal rate and regular rhythm.     Pulses:          Radial pulses are 2+ on the right side and 2+ on the left side.       Posterior tibial pulses are 2+ on the right side and 2+ on the left side.     Heart sounds: Normal heart sounds, S1 normal and S2 normal. No murmur heard. Pulmonary:     Effort: Pulmonary effort is normal. No respiratory distress.     Breath sounds: Normal air entry. Wheezing and rales present.     Comments: Rales auscultated to the right lower lung field with mild expiratory wheezes.  Abdominal:     General: Abdomen is protuberant. Bowel sounds are normal.     Palpations: Abdomen is soft.     Tenderness: There is no abdominal tenderness. There is no right CVA tenderness, left CVA tenderness or guarding.  Musculoskeletal:     Cervical back: Neck supple.     Right lower leg: 2+ Edema present.     Left lower leg: 3+ Edema present.  Skin:    General: Skin is warm and dry.     Capillary Refill: Capillary refill takes less than 2 seconds.     Findings: No rash.  Neurological:     General: No focal deficit present.     Mental Status: He is alert and oriented to person, place, and time. Mental  status is at baseline.     Cranial Nerves: No dysarthria or facial asymmetry.     Gait: Gait is intact.  Psychiatric:        Mood and Affect: Mood normal.        Speech: Speech normal.        Behavior: Behavior normal.        Thought Content: Thought content normal.        Judgment: Judgment normal.      UC Treatments / Results  Labs (all labs ordered are listed, but only abnormal results are displayed) Labs Reviewed - No data to display  EKG   Radiology No results found.  Procedures Procedures (including critical care time)  Medications Ordered in UC Medications - No data to display  Initial Impression / Assessment and Plan / UC Course  I have reviewed the triage vital signs and the nursing notes.  Pertinent labs & imaging results that were available during my care of the patient were reviewed by me and considered in my medical decision making (see chart for details).  Patient is a 66 year old male with a significant past medical history of follicular lymphoma and known stable abdominal aortic aneurysm presenting to urgent care with cough that initially improved, and is now worse.  Rales heard to right lower lung field on auscultation.  Bilateral lower extremities are  swollen with +3 pitting edema to the left lower extremity below the knee and +2 pitting edema to the right lower extremity below the knee.  Patient is not currently on any medications and blood pressure is elevated in the clinic today.  He has never seen a cardiologist and has never had an echocardiogram performed.   EKG stable as compared to previous EKGs. No acute coronary abnormality identified.   Chest x-ray negative for acute cardiopulmonary abnormality.   Patient is at increased risk for deep vein thrombosis due to hypercoagulable state given cancer diagnosis.  No acute concern for DVT today as Bevelyn Buckles' sign is negative bilaterally and patient is without pain to bilateral calfs and posterior knees.  There  is no redness or erythema to bilateral lower extremities. No clinical indication for further emergent evaluation.   Plan to treat for viral upper respiratory infection with prescriptions for supportive care. Guaifenesin and promethazine DM prescribed. Compression stockings for bilateral leg swelling. Cardiology walking referral given. Ortho referral given for follow-up on ankle pain and swelling. No ankle x-ray needed today as I doubt acute bony abnormality and patient bears weight to ankle without problem. Follow-up with PCP recommended for wellness visit.   Counseled patient regarding appropriate use of medications and potential side effects for all medications recommended or prescribed today. Discussed red flag signs and symptoms of worsening condition,when to call the PCP office, return to urgent care, and when to seek higher level of care. Patient verbalizes understanding and agreement with plan. All questions answered. Patient discharged in stable condition.  Final Clinical Impressions(s) / UC Diagnoses   Final diagnoses:  Viral URI with cough     Discharge Instructions      Your EKG is normal.  Your chest x-ray is also normal.  You likely have a viral upper respiratory infection.  Continue taking guaifenesin (Mucinex) twice daily for the next few days to help with nasal congestion and cough.  I have given you a prescription for a stronger cough medicine to take at night that will make you sleepy but will help with your cough.  Do not take this medication and drive or drink alcohol due to drowsiness side effect.  Your leg swelling needs to be further evaluated by cardiologist.  Wear compression socks in the meantime to help with the leg swelling and elevate your legs.  Call the cardiologist listed on your paperwork (Dr. Angelena Form) to schedule an appointment for further evaluation.  Call your primary care provider Chana Bode to schedule an annual wellness visit.  Call the orthopedist  listed on your paperwork for further evaluation of your left ankle.  If you develop any new or worsening symptoms or do not improve in the next 2 to 3 days, please return.  If your symptoms are severe, please go to the emergency room.  Follow-up with your primary care provider for further evaluation and management of your symptoms as well as ongoing wellness visits.  I hope you feel better!     ED Prescriptions     Medication Sig Dispense Auth. Provider   Guaifenesin 1200 MG TB12 Take 1 tablet (1,200 mg total) by mouth in the morning and at bedtime. 14 tablet Joella Prince M, FNP   acetaminophen (TYLENOL) 500 MG tablet Take 2 tablets (1,000 mg total) by mouth every 6 (six) hours as needed. 30 tablet Talbot Grumbling, FNP   promethazine-dextromethorphan (PROMETHAZINE-DM) 6.25-15 MG/5ML syrup Take 2.5 mLs by mouth 4 (four) times daily as needed for cough.  118 mL Talbot Grumbling, FNP      PDMP not reviewed this encounter.   Talbot Grumbling, [place] 04/23/22 1821

## 2022-04-21 NOTE — ED Triage Notes (Addendum)
Cough and sinus issues 3 weeks ago.  Symptoms subsided.  3 days ago started with cough again, intermittent chills, brief episode of sob, right side of throat feels raw.  Patient reports green/white phlegm./    Patient mentions a left ankle pain and swelling.  4 years ago broke ankle and had surgical repair.  Ankle is sore and swelling for the past 6 months

## 2022-04-30 ENCOUNTER — Ambulatory Visit (HOSPITAL_COMMUNITY)
Admission: RE | Admit: 2022-04-30 | Discharge: 2022-04-30 | Disposition: A | Discharge status: Home / Self Care | Payer: Medicare Other | Source: Ambulatory Visit | Attending: Hematology and Oncology | Admitting: Hematology and Oncology

## 2022-04-30 DIAGNOSIS — C8291 Follicular lymphoma, unspecified, lymph nodes of head, face, and neck: Secondary | ICD-10-CM | POA: Diagnosis present

## 2022-04-30 DIAGNOSIS — C8293 Follicular lymphoma, unspecified, intra-abdominal lymph nodes: Secondary | ICD-10-CM | POA: Insufficient documentation

## 2022-04-30 LAB — POCT I-STAT CREATININE: Creatinine, Ser: 1.2 mg/dL (ref 0.61–1.24)

## 2022-04-30 MED ORDER — IOHEXOL 300 MG/ML  SOLN
100.0000 mL | Freq: Once | INTRAMUSCULAR | Status: AC | PRN
  Administered 2022-04-30: 100 mL via INTRAVENOUS

## 2022-04-30 MED ORDER — SODIUM CHLORIDE (PF) 0.9 % IJ SOLN
INTRAMUSCULAR | Status: AC
  Filled 2022-04-30: qty 50

## 2022-05-03 ENCOUNTER — Inpatient Hospital Stay: Payer: Medicare Other | Attending: Hematology and Oncology

## 2022-05-03 ENCOUNTER — Other Ambulatory Visit: Payer: Self-pay

## 2022-05-03 ENCOUNTER — Inpatient Hospital Stay (HOSPITAL_BASED_OUTPATIENT_CLINIC_OR_DEPARTMENT_OTHER): Payer: Medicare Other | Admitting: Hematology and Oncology

## 2022-05-03 VITALS — BP 153/91 | HR 51 | Temp 97.9°F | Resp 15 | Wt 243.1 lb

## 2022-05-03 DIAGNOSIS — M7989 Other specified soft tissue disorders: Secondary | ICD-10-CM | POA: Diagnosis not present

## 2022-05-03 DIAGNOSIS — Z8572 Personal history of non-Hodgkin lymphomas: Secondary | ICD-10-CM | POA: Insufficient documentation

## 2022-05-03 DIAGNOSIS — Z9221 Personal history of antineoplastic chemotherapy: Secondary | ICD-10-CM | POA: Insufficient documentation

## 2022-05-03 DIAGNOSIS — C8233 Follicular lymphoma grade IIIa, intra-abdominal lymph nodes: Secondary | ICD-10-CM

## 2022-05-03 DIAGNOSIS — C8293 Follicular lymphoma, unspecified, intra-abdominal lymph nodes: Secondary | ICD-10-CM | POA: Diagnosis not present

## 2022-05-03 LAB — CMP (CANCER CENTER ONLY)
ALT: 16 U/L (ref 0–44)
AST: 13 U/L — ABNORMAL LOW (ref 15–41)
Albumin: 4.4 g/dL (ref 3.5–5.0)
Alkaline Phosphatase: 72 U/L (ref 38–126)
Anion gap: 7 (ref 5–15)
BUN: 17 mg/dL (ref 8–23)
CO2: 28 mmol/L (ref 22–32)
Calcium: 9.6 mg/dL (ref 8.9–10.3)
Chloride: 105 mmol/L (ref 98–111)
Creatinine: 1.24 mg/dL (ref 0.61–1.24)
GFR, Estimated: >60 mL/min (ref >60)
Glucose, Bld: 96 mg/dL (ref 70–99)
Potassium: 4.3 mmol/L (ref 3.5–5.1)
Sodium: 140 mmol/L (ref 135–145)
Total Bilirubin: 0.6 mg/dL (ref 0.3–1.2)
Total Protein: 6.9 g/dL (ref 6.5–8.1)

## 2022-05-03 LAB — CBC WITH DIFFERENTIAL (CANCER CENTER ONLY)
Abs Immature Granulocytes: 0.05 10*3/uL (ref 0.00–0.07)
Basophils Absolute: 0 10*3/uL (ref 0.0–0.1)
Basophils Relative: 1 %
Eosinophils Absolute: 0.3 10*3/uL (ref 0.0–0.5)
Eosinophils Relative: 4 %
HCT: 40.5 % (ref 39.0–52.0)
Hemoglobin: 14.2 g/dL (ref 13.0–17.0)
Immature Granulocytes: 1 %
Lymphocytes Relative: 16 %
Lymphs Abs: 1.2 10*3/uL (ref 0.7–4.0)
MCH: 32.5 pg (ref 26.0–34.0)
MCHC: 35.1 g/dL (ref 30.0–36.0)
MCV: 92.7 fL (ref 80.0–100.0)
Monocytes Absolute: 0.5 10*3/uL (ref 0.1–1.0)
Monocytes Relative: 7 %
Neutro Abs: 5.3 10*3/uL (ref 1.7–7.7)
Neutrophils Relative %: 71 %
Platelet Count: 217 10*3/uL (ref 150–400)
RBC: 4.37 MIL/uL (ref 4.22–5.81)
RDW: 12.8 % (ref 11.5–15.5)
WBC Count: 7.4 10*3/uL (ref 4.0–10.5)
nRBC: 0 % (ref 0.0–0.2)

## 2022-05-03 LAB — LACTATE DEHYDROGENASE: LDH: 144 U/L (ref 98–192)

## 2022-05-04 ENCOUNTER — Telehealth: Payer: Self-pay | Admitting: Hematology and Oncology

## 2022-05-04 NOTE — Telephone Encounter (Signed)
Per 6/26 los called and spoke to pt about appointment.  Pt confirmed appointment

## 2022-05-05 ENCOUNTER — Telehealth: Payer: Self-pay | Admitting: *Deleted

## 2022-05-05 ENCOUNTER — Ambulatory Visit (HOSPITAL_COMMUNITY)
Admission: RE | Admit: 2022-05-05 | Discharge: 2022-05-05 | Disposition: A | Discharge status: Home / Self Care | Payer: Medicare Other | Source: Ambulatory Visit | Attending: Hematology and Oncology | Admitting: Hematology and Oncology

## 2022-05-05 DIAGNOSIS — M7989 Other specified soft tissue disorders: Secondary | ICD-10-CM | POA: Diagnosis not present

## 2022-05-05 NOTE — Telephone Encounter (Signed)
TCT patient regarding results from Vascular US of legs.  No answer. Left vm message for pt to call me back at his convenience regarding results of [place]. Received return call from pt. Advised that his US showed enlarged lymph nodes in right and left groin that can cause slowing of return blood flow and therefore causing edema in his leg(s).  Advised that this is an indication for additional treatment for his follicular lymphoma. Pt voiced understanding and accepted the fact he will need more chemo. Advised that his port would need to be re-placed. Advised that Dr. Lorenso Courier will be putting orders in for the port placement and chemo and that scheduled would be in touch with him. He voiced understanding

## 2022-05-05 NOTE — Progress Notes (Signed)
Left lower extremity venous duplex has been completed. Preliminary results can be found in CV Proc through chart review.  Results were given to [facility] at Dr. Libby Maw office.  05/05/22 9:24 AM Jorge Gould RVT

## 2022-05-07 ENCOUNTER — Ambulatory Visit: Payer: Medicare Other | Admitting: Hematology and Oncology

## 2022-05-07 ENCOUNTER — Other Ambulatory Visit: Payer: Medicare Other

## 2022-05-31 ENCOUNTER — Other Ambulatory Visit: Payer: Self-pay

## 2022-06-03 ENCOUNTER — Other Ambulatory Visit: Payer: Self-pay | Admitting: *Deleted

## 2022-06-03 ENCOUNTER — Other Ambulatory Visit: Payer: Self-pay | Admitting: Hematology and Oncology

## 2022-06-03 DIAGNOSIS — C8293 Follicular lymphoma, unspecified, intra-abdominal lymph nodes: Secondary | ICD-10-CM

## 2022-06-03 DIAGNOSIS — M7989 Other specified soft tissue disorders: Secondary | ICD-10-CM

## 2022-06-03 DIAGNOSIS — R591 Generalized enlarged lymph nodes: Secondary | ICD-10-CM

## 2022-06-03 DIAGNOSIS — R600 Localized edema: Secondary | ICD-10-CM

## 2022-06-08 ENCOUNTER — Telehealth: Payer: Self-pay | Admitting: Hematology and Oncology

## 2022-06-08 NOTE — Telephone Encounter (Signed)
Per 7/31 in basket, pt has been called and confirmed

## 2022-06-10 ENCOUNTER — Ambulatory Visit (HOSPITAL_COMMUNITY)
Admission: RE | Admit: 2022-06-10 | Discharge: 2022-06-10 | Disposition: A | Discharge status: Home / Self Care | Payer: Medicare Other | Source: Ambulatory Visit | Attending: Hematology and Oncology | Admitting: Hematology and Oncology

## 2022-06-10 DIAGNOSIS — R600 Localized edema: Secondary | ICD-10-CM | POA: Insufficient documentation

## 2022-06-10 DIAGNOSIS — M7989 Other specified soft tissue disorders: Secondary | ICD-10-CM | POA: Insufficient documentation

## 2022-06-10 DIAGNOSIS — Z0189 Encounter for other specified special examinations: Secondary | ICD-10-CM | POA: Diagnosis not present

## 2022-06-10 DIAGNOSIS — R591 Generalized enlarged lymph nodes: Secondary | ICD-10-CM | POA: Diagnosis not present

## 2022-06-10 DIAGNOSIS — C8293 Follicular lymphoma, unspecified, intra-abdominal lymph nodes: Secondary | ICD-10-CM | POA: Insufficient documentation

## 2022-06-10 DIAGNOSIS — I517 Cardiomegaly: Secondary | ICD-10-CM | POA: Insufficient documentation

## 2022-06-10 LAB — ECHOCARDIOGRAM COMPLETE
AR max vel: 2.57 cm2
AV Area VTI: 2.61 cm2
AV Area mean vel: 2.36 cm2
AV Mean grad: 6.7 mmHg
AV Peak grad: 12 mmHg
Ao pk vel: 1.73 m/s
Area-P 1/2: 2.83 cm2
Calc EF: 61.3 %
S' Lateral: 3.1 cm
Single Plane A2C EF: 62 %
Single Plane A4C EF: 61.9 %

## 2022-06-10 NOTE — Progress Notes (Signed)
  Echocardiogram 2D Echocardiogram has been performed.  Bobbye Charleston 06/10/2022, 9:40 AM

## 2022-06-13 ENCOUNTER — Other Ambulatory Visit: Payer: Self-pay | Admitting: Hematology and Oncology

## 2022-06-13 NOTE — Progress Notes (Signed)
DISCONTINUE ON PATHWAY REGIMEN - Lymphoma and CLL     A cycle is every 28 days:     Bendamustine      Rituximab-xxxx   **Always confirm dose/schedule in your pharmacy ordering system**  REASON: Disease Progression PRIOR TREATMENT: [id number]: Bendamustine + Rituximab IV (90/375) q28 Days x 6 Cycles TREATMENT RESPONSE: Partial Response (PR)  START OFF PATHWAY REGIMEN - Lymphoma and CLL   [id number]:R-CHOP + G-CSF q21 Days:   A cycle is every 21 days:     Prednisone      Rituximab-xxxx      Cyclophosphamide      Doxorubicin      Vincristine      Pegfilgrastim-xxxx   **Always confirm dose/schedule in your pharmacy ordering system**  Patient Characteristics: Follicular Lymphoma, Grades 1, 2, and 3A, Second Line, Prior Treatment with Bendamustine + Rituximab, Relapse ? 24 Months, Treating as Specialist or Consulting with Specialist Disease Type: Follicular Lymphoma, Grade 1, 2, or 3A Disease Type: Not Applicable Disease Type: Not Applicable Line of Therapy: Second Line Prior Treatment: Prior Treatment with Bendamustine + Rituximab Time to Relapse: Relapse ? 24 Months Please indicate whether you are: A specialist Intent of Therapy: Curative Intent, Discussed with Patient

## 2022-06-14 NOTE — Progress Notes (Signed)
The pharmacy team has substituted diphenhydramine for IV cetirizine as a premedication. Patient will be monitored for hypersensitivity reaction and adverse reactions to IV cetirizine. Thanks.    Kennith Center, Pharm.D., CPP 06/14/2022'@3'$ :51 PM

## 2022-06-16 ENCOUNTER — Other Ambulatory Visit: Payer: Self-pay | Admitting: Radiology

## 2022-06-17 ENCOUNTER — Encounter (HOSPITAL_COMMUNITY): Payer: Self-pay

## 2022-06-17 ENCOUNTER — Ambulatory Visit (HOSPITAL_COMMUNITY)
Admission: RE | Admit: 2022-06-17 | Discharge: 2022-06-17 | Disposition: A | Discharge status: Home / Self Care | Payer: Medicare Other | Source: Ambulatory Visit | Attending: Hematology and Oncology | Admitting: Hematology and Oncology

## 2022-06-17 ENCOUNTER — Other Ambulatory Visit: Payer: Self-pay | Admitting: Hematology and Oncology

## 2022-06-17 DIAGNOSIS — C8293 Follicular lymphoma, unspecified, intra-abdominal lymph nodes: Secondary | ICD-10-CM

## 2022-06-17 HISTORY — PX: IR IMAGING GUIDED PORT INSERTION: IMG5740

## 2022-06-17 MED ORDER — LIDOCAINE-EPINEPHRINE 1 %-1:100000 IJ SOLN
INTRAMUSCULAR | Status: AC | PRN
  Administered 2022-06-17: 20 mL

## 2022-06-17 MED ORDER — LIDOCAINE HCL 1 % IJ SOLN
INTRAMUSCULAR | Status: AC | PRN
  Administered 2022-06-17: 5 mL

## 2022-06-17 MED ORDER — SODIUM CHLORIDE 0.9 % IV SOLN: INTRAVENOUS | Status: DC

## 2022-06-17 MED ORDER — FENTANYL CITRATE (PF) 100 MCG/2ML IJ SOLN
INTRAMUSCULAR | Status: AC
  Filled 2022-06-17: qty 2

## 2022-06-17 MED ORDER — MIDAZOLAM HCL 2 MG/2ML IJ SOLN
INTRAMUSCULAR | Status: AC
  Filled 2022-06-17: qty 4

## 2022-06-17 MED ORDER — HEPARIN SOD (PORK) LOCK FLUSH 100 UNIT/ML IV SOLN
INTRAVENOUS | Status: AC
  Filled 2022-06-17: qty 5

## 2022-06-17 MED ORDER — FENTANYL CITRATE (PF) 100 MCG/2ML IJ SOLN
INTRAMUSCULAR | Status: AC | PRN
  Administered 2022-06-17 (×2): 50 ug via INTRAVENOUS

## 2022-06-17 MED ORDER — LIDOCAINE-EPINEPHRINE 1 %-1:100000 IJ SOLN
INTRAMUSCULAR | Status: AC
  Filled 2022-06-17: qty 1

## 2022-06-17 MED ORDER — MIDAZOLAM HCL 2 MG/2ML IJ SOLN
INTRAMUSCULAR | Status: AC | PRN
  Administered 2022-06-17: 2 mg via INTRAVENOUS

## 2022-06-17 MED ORDER — HEPARIN SOD (PORK) LOCK FLUSH 100 UNIT/ML IV SOLN
INTRAVENOUS | Status: AC | PRN
  Administered 2022-06-17: 500 [IU] via INTRAVENOUS

## 2022-06-17 NOTE — Discharge Instructions (Signed)

## 2022-06-17 NOTE — H&P (Signed)
Referring Physician(s): Dorsey,John T IV  Supervising Physician: Jacqulynn Cadet  Patient Status:  [facility] OP  Chief Complaint:  "I'm here for another port "  Subjective: Patient familiar to IR service from left supraclavicular lymph node biopsy in 2021, Port-A-Cath placement in 2021 followed by removal in 2022.  He has a history of follicular lymphoma with prior treatment and presents now with some disease progression.  He is scheduled today for Port-A-Cath placement to assist with additional treatment.  He currently denies fever, headache, chest pain, dyspnea, cough, abdominal pain, nausea, vomiting or bleeding.  He does have chronic back pain.  Past Medical History:  Diagnosis Date   Cancer ([place]) 0768   follicular lymphoma   Obesity    Past Surgical History:  Procedure Laterality Date   ANKLE FRACTURE SURGERY  2016   right, trauma repair after fall down stairs   HERNIA REPAIR     umbilical   IR IMAGING GUIDED PORT INSERTION  04/09/2020   IR REMOVAL TUN ACCESS W/ PORT W/O FL MOD SED  11/27/2020   TONSILLECTOMY       Allergies: Patient has no known allergies.  Medications: Prior to Admission medications   Medication Sig Start Date End Date Taking? Authorizing Provider  acetaminophen (TYLENOL) 500 MG tablet Take 2 tablets (1,000 mg total) by mouth every 6 (six) hours as needed. 04/21/22   Talbot Grumbling, FNP  Guaifenesin 1200 MG TB12 Take 1 tablet (1,200 mg total) by mouth in the morning and at bedtime. Patient not taking: Reported on 05/03/2022 04/21/22   Talbot Grumbling, FNP  Naphazoline HCl (CLEAR EYES OP) Place 1 drop into both eyes daily as needed (redness). Patient not taking: No sig reported    [provider]  promethazine-dextromethorphan (PROMETHAZINE-DM) 6.25-15 MG/5ML syrup Take 2.5 mLs by mouth 4 (four) times daily as needed for cough. 04/21/22   Talbot Grumbling, FNP  sildenafil (VIAGRA) 100 MG tablet Take 0.5-1 tablets (50-100 mg  total) by mouth daily as needed for erectile dysfunction. 05/28/20   Tysinger, Camelia Eng, PA-C     Vital Signs: BP (!) 153/96   Pulse (!) 51   Temp 98 F (36.7 C) (Oral)   Resp 16   Ht 6' (1.829 m)   Wt 241 lb (109.3 kg)   SpO2 97%   BMI 32.69 kg/m   Physical Exam awake, alert.  Chest clear to auscultation bilaterally.  Heart with bradycardic but regular rhythm.  Abdomen obese, soft, positive bowel sounds, nontender.  Some mild bilateral pretibial edema noted.  Imaging: No results found.  Labs:  CBC: Recent Labs    10/26/21 1412 05/03/22 1432  WBC 6.2 7.4  HGB 14.5 14.2  HCT 41.1 40.5  PLT 178 217    COAGS: No results for input(s): "INR", "APTT" in the last 8760 hours.  BMP: Recent Labs    10/26/21 1412 04/30/22 0803 05/03/22 1432  NA 139  --  140  K 3.9  --  4.3  CL 106  --  105  CO2 23  --  28  GLUCOSE 96  --  96  BUN 20  --  17  CALCIUM 9.3  --  9.6  CREATININE 1.07 1.20 1.24  GFRNONAA >60  --  >60    LIVER FUNCTION TESTS: Recent Labs    10/26/21 1412 05/03/22 1432  BILITOT 0.7 0.6  AST 17 13*  ALT 19 16  ALKPHOS 83 72  PROT 7.3 6.9  ALBUMIN 4.2 4.4  Assessment and Plan: Patient familiar to IR service from left supraclavicular lymph node biopsy in 2021, Port-A-Cath placement in 2021 followed by removal in 2022.  He has a history of follicular lymphoma with prior treatment and presents now with some disease progression.  He is scheduled today for Port-A-Cath placement to assist with additional treatment. Risks and benefits of image guided port-a-catheter placement was discussed with the patient including, but not limited to bleeding, infection, pneumothorax, or fibrin sheath development and need for additional procedures.  All of the patient's questions were answered, patient is agreeable to proceed. Consent signed and in chart.    Electronically Signed: D. Rowe Robert, PA-C 06/17/2022, 8:31 AM   I spent a total of 20 minutes at the the  patient's bedside AND on the patient's hospital floor or unit, greater than 50% of which was counseling/coordinating care for Port-A-Cath placement

## 2022-06-17 NOTE — Procedures (Signed)
Interventional Radiology Procedure Note  Procedure: Placement of a right IJ approach single lumen PowerPort.  Tip is positioned at the superior cavoatrial junction and catheter is ready for immediate use.  Complications: No immediate Recommendations:  - Ok to shower tomorrow - Do not submerge for 7 days - Routine line care   Signed,  [place]ssica Audia K. Randell Teare, MD   

## 2022-06-22 NOTE — Progress Notes (Signed)
Pharmacist Chemotherapy Monitoring - Initial Assessment    Anticipated start date: 06/29/22   The following has been reviewed per standard work regarding the patient's treatment regimen: The patient's diagnosis, treatment plan and drug doses, and organ/hematologic function Lab orders and baseline tests specific to treatment regimen  The treatment plan start date, drug sequencing, and pre-medications Prior authorization status  [name] documented medication list, including drug-drug interaction screen and prescriptions for anti-emetics and supportive care specific to the treatment regimen The drug concentrations, fluid compatibility, administration routes, and timing of the medications to be used The patient's access for treatment and lifetime cumulative dose history, if applicable  The patient's medication allergies and previous infusion related reactions, if applicable   Changes made to treatment plan:  treatment plan date  Follow up needed:  N/A   Judge Stall, [place], 06/22/2022  4:11 PM

## 2022-06-28 ENCOUNTER — Other Ambulatory Visit: Payer: Self-pay | Admitting: *Deleted

## 2022-06-28 DIAGNOSIS — C8293 Follicular lymphoma, unspecified, intra-abdominal lymph nodes: Secondary | ICD-10-CM

## 2022-06-28 MED FILL — Dexamethasone Sodium Phosphate Inj 100 MG/10ML: INTRAMUSCULAR | Qty: 1 | Status: AC

## 2022-06-28 MED FILL — Fosaprepitant Dimeglumine For IV Infusion 150 MG (Base Eq): INTRAVENOUS | Qty: 5 | Status: AC

## 2022-06-29 ENCOUNTER — Inpatient Hospital Stay: Payer: Medicare Other

## 2022-06-29 ENCOUNTER — Other Ambulatory Visit: Payer: Self-pay

## 2022-06-29 ENCOUNTER — Inpatient Hospital Stay: Payer: Medicare Other | Attending: Hematology and Oncology | Admitting: Physician Assistant

## 2022-06-29 VITALS — BP 160/91 | HR 67 | Temp 97.6°F | Resp 18 | Ht 72.0 in | Wt 242.8 lb

## 2022-06-29 VITALS — BP 132/82 | HR 55 | Temp 98.2°F | Resp 18

## 2022-06-29 DIAGNOSIS — Z5111 Encounter for antineoplastic chemotherapy: Secondary | ICD-10-CM | POA: Insufficient documentation

## 2022-06-29 DIAGNOSIS — Z95828 Presence of other vascular implants and grafts: Secondary | ICD-10-CM

## 2022-06-29 DIAGNOSIS — C8233 Follicular lymphoma grade IIIa, intra-abdominal lymph nodes: Secondary | ICD-10-CM

## 2022-06-29 DIAGNOSIS — C8293 Follicular lymphoma, unspecified, intra-abdominal lymph nodes: Secondary | ICD-10-CM

## 2022-06-29 DIAGNOSIS — C8220 Follicular lymphoma grade III, unspecified, unspecified site: Secondary | ICD-10-CM | POA: Insufficient documentation

## 2022-06-29 DIAGNOSIS — Z79899 Other long term (current) drug therapy: Secondary | ICD-10-CM | POA: Insufficient documentation

## 2022-06-29 DIAGNOSIS — R590 Localized enlarged lymph nodes: Secondary | ICD-10-CM | POA: Diagnosis not present

## 2022-06-29 LAB — CBC WITH DIFFERENTIAL (CANCER CENTER ONLY)
Abs Immature Granulocytes: 0.05 10*3/uL (ref 0.00–0.07)
Basophils Absolute: 0 10*3/uL (ref 0.0–0.1)
Basophils Relative: 1 %
Eosinophils Absolute: 0.4 10*3/uL (ref 0.0–0.5)
Eosinophils Relative: 7 %
HCT: 40.8 % (ref 39.0–52.0)
Hemoglobin: 14.6 g/dL (ref 13.0–17.0)
Immature Granulocytes: 1 %
Lymphocytes Relative: 14 %
Lymphs Abs: 0.7 10*3/uL (ref 0.7–4.0)
MCH: 32.6 pg (ref 26.0–34.0)
MCHC: 35.8 g/dL (ref 30.0–36.0)
MCV: 91.1 fL (ref 80.0–100.0)
Monocytes Absolute: 0.4 10*3/uL (ref 0.1–1.0)
Monocytes Relative: 7 %
Neutro Abs: 3.9 10*3/uL (ref 1.7–7.7)
Neutrophils Relative %: 70 %
Platelet Count: 181 10*3/uL (ref 150–400)
RBC: 4.48 MIL/uL (ref 4.22–5.81)
RDW: 13.3 % (ref 11.5–15.5)
WBC Count: 5.4 10*3/uL (ref 4.0–10.5)
nRBC: 0 % (ref 0.0–0.2)

## 2022-06-29 LAB — CMP (CANCER CENTER ONLY)
ALT: 11 U/L (ref 0–44)
AST: 12 U/L — ABNORMAL LOW (ref 15–41)
Albumin: 4.3 g/dL (ref 3.5–5.0)
Alkaline Phosphatase: 74 U/L (ref 38–126)
Anion gap: 5 (ref 5–15)
BUN: 13 mg/dL (ref 8–23)
CO2: 29 mmol/L (ref 22–32)
Calcium: 9.6 mg/dL (ref 8.9–10.3)
Chloride: 106 mmol/L (ref 98–111)
Creatinine: 1 mg/dL (ref 0.61–1.24)
GFR, Estimated: >60 mL/min (ref >60)
Glucose, Bld: 101 mg/dL — ABNORMAL HIGH (ref 70–99)
Potassium: 4.1 mmol/L (ref 3.5–5.1)
Sodium: 140 mmol/L (ref 135–145)
Total Bilirubin: 0.8 mg/dL (ref 0.3–1.2)
Total Protein: 6.7 g/dL (ref 6.5–8.1)

## 2022-06-29 LAB — LACTATE DEHYDROGENASE: LDH: 147 U/L (ref 98–192)

## 2022-06-29 MED ORDER — SODIUM CHLORIDE 0.9 % IV SOLN
375.0000 mg/m2 | Freq: Once | INTRAVENOUS | Status: AC
  Administered 2022-06-29: 900 mg via INTRAVENOUS
  Filled 2022-06-29: qty 50

## 2022-06-29 MED ORDER — SODIUM CHLORIDE 0.9 % IV SOLN
150.0000 mg | Freq: Once | INTRAVENOUS | Status: AC
  Administered 2022-06-29: 150 mg via INTRAVENOUS
  Filled 2022-06-29: qty 150

## 2022-06-29 MED ORDER — ACETAMINOPHEN 325 MG PO TABS
ORAL_TABLET | ORAL | Status: AC
  Filled 2022-06-29: qty 1

## 2022-06-29 MED ORDER — PREDNISONE 20 MG PO TABS: 60.0000 mg | ORAL_TABLET | Freq: Every day | ORAL | 3 refills | Status: DC

## 2022-06-29 MED ORDER — ACETAMINOPHEN 325 MG PO TABS
650.0000 mg | ORAL_TABLET | Freq: Once | ORAL | Status: AC
  Administered 2022-06-29: 650 mg via ORAL
  Filled 2022-06-29: qty 2

## 2022-06-29 MED ORDER — CETIRIZINE HCL 10 MG/ML IV SOLN
10.0000 mg | Freq: Once | INTRAVENOUS | Status: AC
  Administered 2022-06-29: 10 mg via INTRAVENOUS
  Filled 2022-06-29: qty 1

## 2022-06-29 MED ORDER — SODIUM CHLORIDE 0.9% FLUSH
10.0000 mL | INTRAVENOUS | Status: DC | PRN
  Administered 2022-06-29: 10 mL

## 2022-06-29 MED ORDER — DOXORUBICIN HCL CHEMO IV INJECTION 2 MG/ML
50.0000 mg/m2 | Freq: Once | INTRAVENOUS | Status: AC
  Administered 2022-06-29: 118 mg via INTRAVENOUS
  Filled 2022-06-29: qty 59

## 2022-06-29 MED ORDER — PROCHLORPERAZINE MALEATE 10 MG PO TABS: 10.0000 mg | ORAL_TABLET | Freq: Four times a day (QID) | ORAL | 0 refills | Status: DC | PRN

## 2022-06-29 MED ORDER — VINCRISTINE SULFATE CHEMO INJECTION 1 MG/ML
2.0000 mg | Freq: Once | INTRAVENOUS | Status: AC
  Administered 2022-06-29: 2 mg via INTRAVENOUS
  Filled 2022-06-29: qty 2

## 2022-06-29 MED ORDER — SODIUM CHLORIDE 0.9 % IV SOLN: Freq: Once | INTRAVENOUS | Status: AC

## 2022-06-29 MED ORDER — PALONOSETRON HCL INJECTION 0.25 MG/5ML
0.2500 mg | Freq: Once | INTRAVENOUS | Status: AC
  Administered 2022-06-29: 0.25 mg via INTRAVENOUS
  Filled 2022-06-29: qty 5

## 2022-06-29 MED ORDER — SODIUM CHLORIDE 0.9% FLUSH
10.0000 mL | Freq: Once | INTRAVENOUS | Status: AC
  Administered 2022-06-29: 10 mL

## 2022-06-29 MED ORDER — SODIUM CHLORIDE 0.9 % IV SOLN
750.0000 mg/m2 | Freq: Once | INTRAVENOUS | Status: AC
  Administered 2022-06-29: 1780 mg via INTRAVENOUS
  Filled 2022-06-29: qty 89

## 2022-06-29 MED ORDER — ONDANSETRON HCL 8 MG PO TABS: 8.0000 mg | ORAL_TABLET | Freq: Three times a day (TID) | ORAL | 0 refills | Status: DC | PRN

## 2022-06-29 MED ORDER — SODIUM CHLORIDE 0.9 % IV SOLN
10.0000 mg | Freq: Once | INTRAVENOUS | Status: AC
  Administered 2022-06-29: 10 mg via INTRAVENOUS
  Filled 2022-06-29: qty 10

## 2022-06-29 MED ORDER — HEPARIN SOD (PORK) LOCK FLUSH 100 UNIT/ML IV SOLN
500.0000 [IU] | Freq: Once | INTRAVENOUS | Status: AC | PRN
  Administered 2022-06-29: 500 [IU]

## 2022-06-29 NOTE — Progress Notes (Signed)
[place] Telephone:([phone]) [phone]   [phone]  PROGRESS NOTE  Patient Care Team: Tysinger, Leward Quan as PCP - General (Family Medicine)  Hematological/Oncological History # Follicular Lymphoma. Stage III 1) 01/07/2020: CT Neck W contrast showed cervical lymphadenopathy bilaterally, left greater than right. Numerous enlarged lymph nodes are present 2)  01/22/2020: [place] Core biopsy performed which revealed overall features consistent with involvement by non-Hodgkin's B-cell lymphoma and the morphologic and phenotypic findings favor follicular lymphoma.  3) 01/31/2020: establish care with Dr. Lorenso Courier  4) 02/12/2020: PET CT scan demonstrates bulky adenopathy in the neck, chest, abdomen and pelvis and diffuse skeletal uptake is suspicious for (but not diagnostic) of marrow involvement. 5) 04/30/2020: Cycle 1 Day 1 of Bendamustine/Rituximab 6) 05/29/2020: Cycle 2 Day 1 of Bendamustine/Rituximab 7) 06/25/2020: Cycle 3 Day 1 of Bendamustine/Rituximab 8) 07/23/2020: Cycle 4 Day 1 of Bendamustine/Rituximab 9) 08/25/2020: Cycle 5 Day 1 of Bendamustine/Rituximab 10)09/17/2020: Cycle 6 Day 1 of Bendamustine/Rituximab 11) 10/26/2020: PET CT scan shows partial response (Deauville Score 3 in lymph nodes but some residual FDG avidity in the bone marrow).  12) 04/27/2021: CT scan showed interval decreased adenopathy below the diaphragm, consistent with treatment response. No new or enlarging suspicious lymph nodes 13) 10/27/2021: CT scan showed increased left inguinal adenopathy, including a 1.6 cm left inguinal lymph node which was previously 0.9 cm in short axis. 14) 04/30/2022: CT scan shows progressive adenopathy below the diaphragm with new thoracic adenopathy and increased hazy retroperitoneal stranding, consistent with worsening lymphomatous disease. 15) 05/05/2022: Doppler US of lower extremities revealed bilateral lymph nodes in the groin.   16) 06/10/2022: Echo: EF 60-65% 17)  06/29/2022: Cycle 1 of R-CHOP  Interval History:  Jorge Gould 66 y.o. male with medical history significant for Stage III follicular lymphoma who presents for a follow up visit. The patient's last visit was on 05/05/2022. In the interim since the last visit, he underwent doppler US of lower extremities that revealed enlarged lymph nodes in his groin which meets criteria to proceed with second line therapy.   On exam today Jorge Gould is accompanied by his wife.  He reports that his left leg is still swollen but has not worsened since last visit.  The palpable nodule in his left neck has increased in size over the last few weeks.  He denies any changes to his energy levels and he continues to work 7 days a week.  He denies any nausea, vomiting or abdominal pain.  His bowel habits are unchanged without any recurrent episodes of diarrhea or constipation.  He denies easy bruising or signs of active bleeding.  Patient denies fevers, chills, night sweats, shortness of breath, chest pain or cough.  He has no other complaints.  Rest of 10 point ROS is below.   MEDICAL HISTORY:  Past Medical History:  Diagnosis Date   Cancer ([place]) 3154   follicular lymphoma   Obesity     SURGICAL HISTORY: Past Surgical History:  Procedure Laterality Date   ANKLE FRACTURE SURGERY  2016   right, trauma repair after fall down stairs   HERNIA REPAIR     umbilical   IR IMAGING GUIDED PORT INSERTION  04/09/2020   IR IMAGING GUIDED PORT INSERTION  06/17/2022   IR REMOVAL TUN ACCESS W/ PORT W/O FL MOD SED  11/27/2020   TONSILLECTOMY      SOCIAL HISTORY: Social History   Socioeconomic History   Marital status: Married    Spouse name: Not on file  Number of children: Not on file   Years of education: Not on file   Highest education level: Not on file  Occupational History   Not on file  Tobacco Use   Smoking status: Never   Smokeless tobacco: Never  Vaping Use   Vaping Use: Never used  Substance and Sexual  Activity   Alcohol use: Yes    Alcohol/week: 3.0 standard drinks of alcohol    Types: 3 Shots of liquor per week   Drug use: Not Currently   Sexual activity: Not on file  Other Topics Concern   Not on file  Social History Narrative   Engaged, plan to marry in March 2021, been together since 2014.  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   Most exercise on the job, working 7 days per week.   No children, but fiance has 5 children.    11/2019   Social Determinants of Health   Financial Resource Strain: Not on file  Food Insecurity: Not on file  Transportation Needs: Not on file  Physical Activity: Not on file  Stress: Not on file  Social Connections: Not on file  Intimate Partner Violence: Not on file    FAMILY HISTORY: Family History  Problem Relation Age of Onset   Cancer Mother        lung; former smoker   COPD Father        emphysema   Diabetes Maternal Aunt    Heart disease Maternal Uncle    Heart disease Maternal Uncle    Diabetes Maternal Aunt    Stroke Neg Hx    Hyperlipidemia Neg Hx    Hypertension Neg Hx     ALLERGIES:  has No Known Allergies.  MEDICATIONS:  Current Outpatient Medications  Medication Sig Dispense Refill   acetaminophen (TYLENOL) 500 MG tablet Take 2 tablets (1,000 mg total) by mouth every 6 (six) hours as needed. 30 tablet 0   ondansetron (ZOFRAN) 8 MG tablet Take 1 tablet (8 mg total) by mouth every 8 (eight) hours as needed for nausea or vomiting. 90 tablet 0   prochlorperazine (COMPAZINE) 10 MG tablet Take 1 tablet (10 mg total) by mouth every 6 (six) hours as needed for nausea or vomiting. 90 tablet 0   promethazine-dextromethorphan (PROMETHAZINE-DM) 6.25-15 MG/5ML syrup Take 2.5 mLs by mouth 4 (four) times daily as needed for cough. 118 mL 0   sildenafil (VIAGRA) 100 MG tablet Take 0.5-1 tablets (50-100 mg total) by mouth daily as needed for erectile dysfunction. 10 tablet 0   Guaifenesin 1200 MG TB12 Take 1 tablet (1,200 mg  total) by mouth in the morning and at bedtime. (Patient not taking: Reported on 05/03/2022) 14 tablet 0   Naphazoline HCl (CLEAR EYES OP) Place 1 drop into both eyes daily as needed (redness). (Patient not taking: Reported on 05/28/2020)     No current facility-administered medications for this visit.    REVIEW OF SYSTEMS:   Constitutional: ( - ) fevers, ( - )  chills , ( - ) night sweats Eyes: ( - ) blurriness of vision, ( - ) double vision, ( - ) watery eyes Ears, nose, mouth, throat, and face: ( - ) mucositis, ( - ) sore throat Respiratory: ( - ) cough, ( - ) dyspnea, ( - ) wheezes Cardiovascular: ( - ) palpitation, ( - ) chest discomfort, ( - ) lower extremity swelling Gastrointestinal:  ( - ) nausea, ( - ) heartburn, ( - ) change  in bowel habits Skin: ( - ) abnormal skin rashes Lymphatics: ( - ) new lymphadenopathy, ( - ) easy bruising Neurological: ( - ) numbness, ( - ) tingling, ( - ) new weaknesses Behavioral/Psych: ( - ) mood change, ( - ) new changes  All other systems were reviewed with the patient and are negative.  PHYSICAL EXAMINATION: ECOG PERFORMANCE STATUS: 1 - Symptomatic but completely ambulatory  Vitals:   06/29/22 0912  BP: (!) 160/91  Pulse: 67  Resp: 18  Temp: 97.6 F (36.4 C)  SpO2: 97%    Filed Weights   06/29/22 0912  Weight: 242 lb 12.8 oz (110.1 kg)     GENERAL: well appearing middle aged Caucasian male in NAD  SKIN: skin color, texture, turgor are normal, no rashes or significant lesions EYES: conjunctiva are pink and non-injected, sclera clear NECK: supple, non-tender LYMPH: Two palpable left cervical lymph nodes.  LUNGS: clear to auscultation and percussion with normal breathing effort HEART: regular rate & rhythm and no murmurs and no lower extremity edema Musculoskeletal: no cyanosis of digits and no clubbing  PSYCH: alert & oriented x 3, fluent speech NEURO: no focal motor/sensory deficits  LABORATORY DATA:  I have reviewed the data as  listed    Latest Ref Rng & Units 06/29/2022    8:36 AM 05/03/2022    2:32 PM 10/26/2021    2:12 PM  CBC  WBC 4.0 - 10.5 K/uL 5.4  7.4  6.2   Hemoglobin 13.0 - 17.0 g/dL 14.6  14.2  14.5   Hematocrit 39.0 - 52.0 % 40.8  40.5  41.1   Platelets 150 - 400 K/uL 181  217  178        Latest Ref Rng & Units 06/29/2022    8:36 AM 05/03/2022    2:32 PM 04/30/2022    8:03 AM  CMP  Glucose 70 - 99 mg/dL 101  96    BUN 8 - 23 mg/dL 13  17    Creatinine 0.61 - 1.24 mg/dL 1.00  1.24  1.20   Sodium 135 - 145 mmol/L 140  140    Potassium 3.5 - 5.1 mmol/L 4.1  4.3    Chloride 98 - 111 mmol/L 106  105    CO2 22 - 32 mmol/L 29  28    Calcium 8.9 - 10.3 mg/dL 9.6  9.6    Total Protein 6.5 - 8.1 g/dL 6.7  6.9    Total Bilirubin 0.3 - 1.2 mg/dL 0.8  0.6    Alkaline Phos 38 - 126 U/L 74  72    AST 15 - 41 U/L 12  13    ALT 0 - 44 U/L 11  16     RADIOGRAPHIC STUDIES: I have personally reviewed the radiological images as listed and agreed with the findings in the report: Marked response in the patient's lymphadenopathy and decrease in his FDG avidity.  Bone marrow involvement also appears considerably less.  Overall consistent with a partial response.  IR IMAGING GUIDED PORT INSERTION  Result Date: 06/17/2022 INDICATION: 66 year old male with follicular lymphoma of intra-abdominal lymph nodes, unspecified grade. He requires durable venous access for chemotherapy. EXAM: IMPLANTED PORT A CATH PLACEMENT WITH ULTRASOUND AND FLUOROSCOPIC GUIDANCE MEDICATIONS: None. ANESTHESIA/SEDATION: Versed 2 mg IV; Fentanyl 100 mcg IV; Moderate Sedation Time:  15 minutes The patient's vital signs and level of consciousness were continuously monitored during the procedure by the interventional radiology nurse under my direct supervision. FLUOROSCOPY: Radiation exposure index:  1 mGy reference air kerma COMPLICATIONS: None immediate. PROCEDURE: The right neck and chest was prepped with chlorhexidine, and draped in the usual  sterile fashion using maximum barrier technique (cap and mask, sterile gown, sterile gloves, large sterile sheet, hand hygiene and cutaneous antiseptic). Local anesthesia was attained by infiltration with 1% lidocaine with epinephrine. Ultrasound demonstrated patency of the right internal jugular vein, and this was documented with an image. Under real-time ultrasound guidance, this vein was accessed with a 21 gauge micropuncture needle and image documentation was performed. A small dermatotomy was made at the access site with an 11 scalpel. A 0.018" wire was advanced into the SVC and the access needle exchanged for a 76F micropuncture vascular sheath. The 0.018" wire was then removed and a 0.035" wire advanced into the IVC. An appropriate location for the subcutaneous reservoir was selected below the clavicle and an incision was made through the skin and underlying soft tissues. The subcutaneous tissues were then dissected using a combination of blunt and sharp surgical technique and a pocket was formed. A single lumen power injectable portacatheter was then tunneled through the subcutaneous tissues from the pocket to the dermatotomy and the port reservoir placed within the subcutaneous pocket. The venous access site was then serially dilated and a peel away vascular sheath placed over the wire. The wire was removed and the port catheter advanced into position under fluoroscopic guidance. The catheter tip is positioned in the superior cavoatrial junction. This was documented with a spot image. The portacatheter was then tested and found to flush and aspirate well. The port was flushed with saline followed by 100 units/mL heparinized saline. The pocket was then closed in two layers using first subdermal inverted interrupted absorbable sutures followed by a running subcuticular suture. The epidermis was then sealed with Dermabond. The dermatotomy at the venous access site was also closed with Dermabond. IMPRESSION:  Successful placement of a right IJ approach Power Port with ultrasound and fluoroscopic guidance. The catheter is ready for use. Electronically Signed   By: Jacqulynn Cadet M.D.   On: 06/17/2022 16:14   ECHOCARDIOGRAM COMPLETE  Result Date: 06/10/2022    ECHOCARDIOGRAM REPORT   Patient Name:   Jorge Gould Date of Exam: 06/10/2022 Medical Rec #:  [id number]       Height:       71.5 in Accession #:    [id number]      Weight:       243.1 lb Date of Birth:  November 12, 1955        BSA:          2.303 m Patient Age:    65 years        BP:           168/96 mmHg Patient Gender: M               HR:           47 bpm. Exam Location:  Outpatient Procedure: 2D Echo, 3D Echo, Cardiac Doppler, Color Doppler and Strain Analysis Indications:    Z51.11 Encounter for antineoplastic chemotheraphy  History:        Patient has no prior history of Echocardiogram examinations.                 Risk Factors:Dyslipidemia. Cancer.  Sonographer:    Roseanna Rainbow RDCS Referring Phys: [id number] Ledell Peoples IV  Sonographer Comments: Image acquisition challenging due to patient body habitus. IMPRESSIONS  1. Left ventricular ejection fraction, by  estimation, is 60 to 65%. Left ventricular ejection fraction by 3D volume is 60 %. The left ventricle has normal function. The left ventricle has no regional wall motion abnormalities. There is moderate concentric left ventricular hypertrophy. Left ventricular diastolic parameters are consistent with Grade I diastolic dysfunction (impaired relaxation). The average left ventricular global longitudinal strain is -23.0 %. The global longitudinal strain is normal.  2. Right ventricular systolic function is normal. The right ventricular size is dilated. Tricuspid regurgitation signal is inadequate for assessing PA pressure.  3. The mitral valve is grossly normal. No evidence of mitral valve regurgitation. No evidence of mitral stenosis.  4. The aortic valve is tricuspid. There is mild calcification of the aortic  valve. There is mild thickening of the aortic valve. Aortic valve regurgitation is not visualized. Aortic valve sclerosis is present, with no evidence of aortic valve stenosis.  5. Aortic dilatation noted. There is mild dilatation of the ascending aorta, measuring 42 mm.  6. The inferior vena cava is normal in size with greater than 50% respiratory variability, suggesting right atrial pressure of 3 mmHg. Comparison(s): No prior Echocardiogram. FINDINGS  Left Ventricle: Left ventricular ejection fraction, by estimation, is 60 to 65%. Left ventricular ejection fraction by 3D volume is 60 %. The left ventricle has normal function. The left ventricle has no regional wall motion abnormalities. The average left ventricular global longitudinal strain is -23.0 %. The global longitudinal strain is normal. The left ventricular internal cavity size was normal in size. There is moderate concentric left ventricular hypertrophy. Left ventricular diastolic parameters are consistent with Grade I diastolic dysfunction (impaired relaxation). Right Ventricle: The right ventricular size is moderately enlarged. No increase in right ventricular wall thickness. Right ventricular systolic function is normal. Tricuspid regurgitation signal is inadequate for assessing PA pressure. Left Atrium: Left atrial size was normal in size. Right Atrium: Right atrial size was normal in size. Pericardium: There is no evidence of pericardial effusion. Mitral Valve: The mitral valve is grossly normal. No evidence of mitral valve regurgitation. No evidence of mitral valve stenosis. Tricuspid Valve: The tricuspid valve is normal in structure. Tricuspid valve regurgitation is not demonstrated. No evidence of tricuspid stenosis. Aortic Valve: The aortic valve is tricuspid. There is mild calcification of the aortic valve. There is mild thickening of the aortic valve. There is mild aortic valve annular calcification. Aortic valve regurgitation is not  visualized. Aortic valve sclerosis is present, with no evidence of aortic valve stenosis. Aortic valve mean gradient measures 6.7 mmHg. Aortic valve peak gradient measures 12.0 mmHg. Aortic valve area, by VTI measures 2.61 cm. Pulmonic Valve: The pulmonic valve was not well visualized. Pulmonic valve regurgitation is not visualized. No evidence of pulmonic stenosis. Aorta: Aortic dilatation noted. There is mild dilatation of the ascending aorta, measuring 42 mm. Venous: The inferior vena cava is normal in size with greater than 50% respiratory variability, suggesting right atrial pressure of 3 mmHg. IAS/Shunts: No atrial level shunt detected by color flow Doppler.  LEFT VENTRICLE PLAX 2D LVIDd:         4.80 cm         Diastology LVIDs:         3.10 cm         LV e' medial:    5.73 cm/s LV PW:         1.30 cm         LV E/e' medial:  13.1 LV IVS:        1.30  cm         LV e' lateral:   7.80 cm/s LVOT diam:     2.40 cm         LV E/e' lateral: 9.6 LV SV:         110 LV SV Index:   48              2D LVOT Area:     4.52 cm        Longitudinal                                Strain                                2D Strain GLS  -23.0 % LV Volumes (MOD)               Avg: LV vol d, MOD    120.0 ml A2C:                           3D Volume EF LV vol d, MOD    108.0 ml      LV 3D EF:    Left A4C:                                        ventricul LV vol s, MOD    45.6 ml                    ar A2C:                                        ejection LV vol s, MOD    41.2 ml                    fraction A4C:                                        by 3D LV SV MOD A2C:   74.4 ml                    volume is LV SV MOD A4C:   108.0 ml                   60 %. LV SV MOD BP:    71.4 ml                                 3D Volume EF:                                3D EF:        60 %                                LV EDV:       201 ml  LV ESV:       80 ml                                LV SV:        121 ml RIGHT  VENTRICLE             IVC RV S prime:     12.90 cm/s  IVC diam: 1.90 cm TAPSE (M-mode): 2.9 cm LEFT ATRIUM             Index        RIGHT ATRIUM           Index LA diam:        3.50 cm 1.52 cm/m   RA Area:     22.00 cm LA Vol (A2C):   56.9 ml 24.71 ml/m  RA Volume:   70.30 ml  30.53 ml/m LA Vol (A4C):   37.2 ml 16.15 ml/m LA Biplane Vol: 47.8 ml 20.76 ml/m  AORTIC VALVE AV Area (Vmax):    2.57 cm AV Area (Vmean):   2.36 cm AV Area (VTI):     2.61 cm AV Vmax:           173.00 cm/s AV Vmean:          116.333 cm/s AV VTI:            0.422 m AV Peak Grad:      12.0 mmHg AV Mean Grad:      6.7 mmHg LVOT Vmax:         98.10 cm/s LVOT Vmean:        60.800 cm/s LVOT VTI:          0.243 m LVOT/AV VTI ratio: 0.58  AORTA Ao Root diam: 3.90 cm Ao Asc diam:  4.00 cm MITRAL VALVE MV Area (PHT): 2.83 cm    SHUNTS MV Decel Time: 268 msec    Systemic VTI:  0.24 m MV E velocity: 75.00 cm/s  Systemic Diam: 2.40 cm MV A velocity: 77.10 cm/s MV E/A ratio:  0.97 Rudean Haskell MD Electronically signed by Rudean Haskell MD Signature Date/Time: 06/10/2022/12:39:30 PM    Final     ASSESSMENT & PLAN Katherine Roan 66 y.o. male with medical history significant for diagnosed follicular lymphoma who presents for a follow up visit.   He completed 6 cycles of Bendamustine/Rituximab therapy from 04/30/2020-09/17/2020. Repeat PET CT scan on 10/26/2020 showed a partial response. OK to enter observation.   CT scan from 04/30/2022 and doppler US from 05/05/2022 showed enlarging adenopathy. Recommended second line therapy with R-CHOP. Regimen consists of Rituximab 375 mg/m2, cytoxan 750 mg/m2, vincristine 2 mg IV, adriamycin 50 mg/m2 along with prednisone 60 mg PO on days 1-5. This regimen is given every 21 days.   # Follicular Lymphoma. Stage III --patient completed Cycle 6 of R-Benda therapy. This was administered with curative intent.  --PET CT scan after 6 cycles of R-Benda showed substantial partial response. Residual  mild hypermetabolism within the retroperitoneal and left common iliac chains (Deauville score 3). No residual metabolically active adenopathy in the neck or chest --per [place] Criteria, patient is a Partial Response.  --CT neck and CAP from 04/30/2022 showed progressive adenopathy.  --Doppler  US from 05/05/2022 showed progressive inguinal adenopathy, bilateral. --Recommend systemic therapy with R-CHOP, started today 06/29/2022 Plan:  --Due to start Cycle 1 of R-CHOP today --Baseline ECHO from 06/10/2022 shows EF 60-65% --Labs  from today were reviewed and adequate for treatment.  --Proceed with treatment today without any modifications --RTC in 3 weeks for labs, f/u visit with Dr. Lorenso Courier before Cycle 2 of R-CHOP.   # Left Lower Leg Swelling, stable --Patient has redness and +2 pitting edema of left lower extremity --Doppler US from 05/05/2022 showed no evidence of DVT. There are enlarged lymph nodes noted in the groin, bilaterally which is likely causing the swelling.   #Supportive Care -- port placement done -- zofran '8mg'$  q8H PRN and compazine '10mg'$  PO q6H for nausea -- EMLA cream for port -- no pain medication required at this time.     No orders of the defined types were placed in this encounter.   All questions were answered. The patient knows to call the clinic with any problems, questions or concerns.  I have spent a total of 30 minutes minutes of face-to-face and non-face-to-face time, preparing to see the patient,  performing a medically appropriate examination, counseling and educating the patient, ordering medications, documenting clinical information in the electronic health record, and care coordination.   Dede Query PA-C Dept of Hematology and [place] at [facility] Phone: [phone]   06/29/2022 9:41 AM

## 2022-06-29 NOTE — Patient Instructions (Signed)
[facility]  Discharge Instructions: Thank you for choosing [facility] to provide your oncology and hematology care.   If you have a lab appointment with the [facility], please go directly to the [facility] and check in at the registration area.   Wear comfortable clothing and clothing appropriate for easy access to any Portacath or PICC line.   We strive to give you quality time with your provider. You may need to reschedule your appointment if you arrive late (15 or more minutes).  Arriving late affects you and other patients whose appointments are after yours.  Also, if you miss three or more appointments without notifying the office, you may be dismissed from the clinic at the provider's discretion.      For prescription refill requests, have your pharmacy contact our office and allow 72 hours for refills to be completed.    Today you received the following chemotherapy and/or immunotherapy agents: Doxorubicin (Adriamycin), Vincristine (Oncovin), Cyclophosphamide (Cytoxan), Rituximab (Ruxience)   To help prevent nausea and vomiting after your treatment, we encourage you to take your nausea medication as directed.  BELOW ARE SYMPTOMS THAT SHOULD BE REPORTED IMMEDIATELY: *FEVER GREATER THAN 100.4 F (38 C) OR HIGHER *CHILLS OR SWEATING *NAUSEA AND VOMITING THAT IS NOT CONTROLLED WITH YOUR NAUSEA MEDICATION *UNUSUAL SHORTNESS OF BREATH *UNUSUAL BRUISING OR BLEEDING *URINARY PROBLEMS (pain or burning when urinating, or frequent urination) *BOWEL PROBLEMS (unusual diarrhea, constipation, pain near the anus) TENDERNESS IN MOUTH AND THROAT WITH OR WITHOUT PRESENCE OF ULCERS (sore throat, sores in mouth, or a toothache) UNUSUAL RASH, SWELLING OR PAIN  UNUSUAL VAGINAL DISCHARGE OR ITCHING   Items with * indicate a potential emergency and should be followed up as soon as possible or go to the Emergency Department if any problems should  occur.  Please show the CHEMOTHERAPY ALERT CARD or IMMUNOTHERAPY ALERT CARD at check-in to the Emergency Department and triage nurse.  Should you have questions after your visit or need to cancel or reschedule your appointment, please contact [facility]  Dept: [phone]  and follow the prompts.  Office hours are 8:00 a.m. to 4:30 p.m. Monday - Friday. Please note that voicemails left after 4:00 p.m. may not be returned until the following business day.  We are closed weekends and major holidays. You have access to a nurse at all times for urgent questions. Please call the main number to the clinic Dept: [phone] and follow the prompts.   For any non-urgent questions, you may also contact your provider using MyChart. We now offer e-Visits for anyone 44 and older to request care online for non-urgent symptoms. For details visit [email or web address].   Also download the MyChart app! Go to the app store, search "MyChart", open the app, select [facility], and log in with your MyChart username and password.  Masks are optional in the cancer centers. If you would like for your care team to wear a mask while they are taking care of you, please let them know. You may have one support person who is at least 68 years old accompany you for your appointments.

## 2022-06-30 ENCOUNTER — Telehealth: Payer: Self-pay | Admitting: Hematology and Oncology

## 2022-06-30 NOTE — Telephone Encounter (Signed)
Per 8/22 los called and spoke to pt about appointment.  Pt confirmed appointment

## 2022-07-01 ENCOUNTER — Inpatient Hospital Stay: Payer: Medicare Other

## 2022-07-01 ENCOUNTER — Other Ambulatory Visit: Payer: Self-pay

## 2022-07-01 VITALS — BP 146/80 | HR 50 | Temp 98.2°F | Resp 20

## 2022-07-01 DIAGNOSIS — C8233 Follicular lymphoma grade IIIa, intra-abdominal lymph nodes: Secondary | ICD-10-CM

## 2022-07-01 DIAGNOSIS — Z5111 Encounter for antineoplastic chemotherapy: Secondary | ICD-10-CM | POA: Diagnosis not present

## 2022-07-01 MED ORDER — PEGFILGRASTIM-CBQV 6 MG/0.6ML ~~LOC~~ SOSY
6.0000 mg | PREFILLED_SYRINGE | Freq: Once | SUBCUTANEOUS | Status: AC
  Administered 2022-07-01: 6 mg via SUBCUTANEOUS
  Filled 2022-07-01: qty 0.6

## 2022-07-01 NOTE — Patient Instructions (Signed)

## 2022-07-02 ENCOUNTER — Ambulatory Visit: Payer: Medicare Other

## 2022-07-02 ENCOUNTER — Ambulatory Visit: Payer: Medicare Other | Admitting: Physician Assistant

## 2022-07-02 ENCOUNTER — Other Ambulatory Visit: Payer: Medicare Other

## 2022-07-02 ENCOUNTER — Inpatient Hospital Stay: Payer: Medicare Other | Admitting: Licensed Clinical Social Worker

## 2022-07-02 DIAGNOSIS — C8233 Follicular lymphoma grade IIIa, intra-abdominal lymph nodes: Secondary | ICD-10-CM

## 2022-07-02 NOTE — Progress Notes (Signed)
[facility]  Initial Assessment   Jorge Gould is a 66 y.o. year old male contacted by phone. Clinical Social Work was referred by medical provider for assessment of psychosocial needs.   SDOH (Social Determinants of Health) assessments performed: Yes   SDOH Screenings   Alcohol Screen: Not on file  Depression (PHQ2-9): Low Risk  (11/27/2019)   Depression (PHQ2-9)    PHQ-2 Score: 0  Financial Resource Strain: Not on file  Food Insecurity: Not on file  Housing: Not on file  Physical Activity: Not on file  Social Connections: Not on file  Stress: Not on file  Tobacco Use: Low Risk  (06/17/2022)   Patient History    Smoking Tobacco Use: Never    Smokeless Tobacco Use: Never    Passive Exposure: Not on file  Transportation Needs: Not on file     Distress Screen completed: No     No data to display            Family/Social Information:  Housing Arrangement: patient lives with spouse. Family members/support persons in your life? 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  Income source: Employment Financial concerns: No Type of concern: None Food access concerns: no Religious or spiritual practice: Hydrographic surveyor Currently in place:  none  Coping/ Adjustment to diagnosis: Patient understands treatment plan and what happens next? yes Concerns about diagnosis and/or treatment: I'm not especially worried about anything Patient reported stressors:  none Hopes and/or priorities: Pt's priority is to continue treatment w/ the hope of positive results Patient enjoys time with family/ friends Current coping skills/ strengths: Motivation for treatment/growth , Physical Health , and Supportive family/friends     SUMMARY: Current SDOH Barriers:  None identified  Clinical Social Work Clinical Goal(s):  No clinical social work goals at this  time  Interventions: Discussed common feeling and emotions when being diagnosed with cancer, and the importance of support during treatment Informed patient of the support team roles and support services at [facility] Provided [facility] contact information and encouraged patient to call with any questions or concerns Pt reports he feels well following treatment and is optimistic.  Pt receptive to receiving contact details for CSW should he need support in the future.   Follow Up Plan: Patient will contact CSW with any support or resource needs Patient verbalizes understanding of plan: Yes    Henriette Combs, LCSW

## 2022-07-09 ENCOUNTER — Other Ambulatory Visit: Payer: Self-pay

## 2022-07-12 ENCOUNTER — Other Ambulatory Visit: Payer: Self-pay | Admitting: Hematology and Oncology

## 2022-07-12 DIAGNOSIS — C8233 Follicular lymphoma grade IIIa, intra-abdominal lymph nodes: Secondary | ICD-10-CM

## 2022-07-12 NOTE — Progress Notes (Signed)
OFF PATHWAY REGIMEN - Lymphoma and CLL  No Change  Continue With Treatment as Ordered.  Original Decision Date/Time: 06/13/2022 18:50   [id number]:R-CHOP + G-CSF q21 Days:   A cycle is every 21 days:     Prednisone      Rituximab-xxxx      Cyclophosphamide      Doxorubicin      Vincristine      Pegfilgrastim-xxxx   **Always confirm dose/schedule in your pharmacy ordering system**  Patient Characteristics: Follicular Lymphoma, Grades 1, 2, and 3A, Second Line, Prior Treatment with Bendamustine + Rituximab, Relapse ? 24 Months, Treating as Specialist or Consulting with Specialist Disease Type: Follicular Lymphoma, Grade 1, 2, or 3A Disease Type: Not Applicable Disease Type: Not Applicable Line of Therapy: Second Line Prior Treatment: Prior Treatment with Bendamustine + Rituximab Time to Relapse: Relapse ? 24 Months Please indicate whether you are: A specialist Intent of Therapy: Curative Intent, Discussed with Patient

## 2022-07-14 ENCOUNTER — Other Ambulatory Visit: Payer: Self-pay

## 2022-07-19 ENCOUNTER — Other Ambulatory Visit: Payer: Self-pay | Admitting: Physician Assistant

## 2022-07-19 NOTE — Telephone Encounter (Signed)
Per last OV, continue compazine. Gardiner Rhyme, RN

## 2022-07-23 ENCOUNTER — Ambulatory Visit (HOSPITAL_COMMUNITY)
Admission: RE | Admit: 2022-07-23 | Discharge: 2022-07-23 | Disposition: A | Discharge status: Home / Self Care | Payer: Medicare Other | Source: Ambulatory Visit | Attending: Hematology and Oncology | Admitting: Hematology and Oncology

## 2022-07-23 ENCOUNTER — Encounter (HOSPITAL_COMMUNITY): Payer: Self-pay

## 2022-07-23 DIAGNOSIS — C8293 Follicular lymphoma, unspecified, intra-abdominal lymph nodes: Secondary | ICD-10-CM | POA: Insufficient documentation

## 2022-07-23 MED ORDER — SODIUM CHLORIDE (PF) 0.9 % IJ SOLN
INTRAMUSCULAR | Status: AC
  Filled 2022-07-23: qty 50

## 2022-07-23 MED ORDER — HEPARIN SOD (PORK) LOCK FLUSH 100 UNIT/ML IV SOLN
INTRAVENOUS | Status: AC
  Filled 2022-07-23: qty 5

## 2022-07-23 MED ORDER — IOHEXOL 300 MG/ML  SOLN
100.0000 mL | Freq: Once | INTRAMUSCULAR | Status: AC | PRN
  Administered 2022-07-23: 100 mL via INTRAVENOUS

## 2022-07-23 MED ORDER — HEPARIN SOD (PORK) LOCK FLUSH 100 UNIT/ML IV SOLN
500.0000 [IU] | Freq: Once | INTRAVENOUS | Status: AC
  Administered 2022-07-23: 500 [IU] via INTRAVENOUS

## 2022-07-23 MED FILL — Fosaprepitant Dimeglumine For IV Infusion 150 MG (Base Eq): INTRAVENOUS | Qty: 5 | Status: AC

## 2022-07-23 MED FILL — Dexamethasone Sodium Phosphate Inj 100 MG/10ML: INTRAMUSCULAR | Qty: 1 | Status: AC

## 2022-07-26 ENCOUNTER — Other Ambulatory Visit: Payer: Self-pay | Admitting: *Deleted

## 2022-07-26 ENCOUNTER — Inpatient Hospital Stay: Payer: Medicare Other | Attending: Hematology and Oncology

## 2022-07-26 ENCOUNTER — Inpatient Hospital Stay (HOSPITAL_BASED_OUTPATIENT_CLINIC_OR_DEPARTMENT_OTHER): Payer: Medicare Other | Admitting: Hematology and Oncology

## 2022-07-26 ENCOUNTER — Other Ambulatory Visit: Payer: Self-pay

## 2022-07-26 ENCOUNTER — Inpatient Hospital Stay: Payer: Medicare Other

## 2022-07-26 VITALS — BP 157/92 | HR 61 | Temp 97.8°F | Resp 18

## 2022-07-26 VITALS — BP 179/91 | HR 45 | Temp 97.4°F | Resp 17 | Wt 241.7 lb

## 2022-07-26 DIAGNOSIS — C8233 Follicular lymphoma grade IIIa, intra-abdominal lymph nodes: Secondary | ICD-10-CM

## 2022-07-26 DIAGNOSIS — Z5111 Encounter for antineoplastic chemotherapy: Secondary | ICD-10-CM | POA: Diagnosis present

## 2022-07-26 DIAGNOSIS — R590 Localized enlarged lymph nodes: Secondary | ICD-10-CM | POA: Diagnosis not present

## 2022-07-26 DIAGNOSIS — Z79899 Other long term (current) drug therapy: Secondary | ICD-10-CM | POA: Diagnosis not present

## 2022-07-26 DIAGNOSIS — C8220 Follicular lymphoma grade III, unspecified, unspecified site: Secondary | ICD-10-CM | POA: Insufficient documentation

## 2022-07-26 DIAGNOSIS — Z95828 Presence of other vascular implants and grafts: Secondary | ICD-10-CM

## 2022-07-26 LAB — CBC WITH DIFFERENTIAL (CANCER CENTER ONLY)
Abs Immature Granulocytes: 0.08 10*3/uL — ABNORMAL HIGH (ref 0.00–0.07)
Basophils Absolute: 0.1 10*3/uL (ref 0.0–0.1)
Basophils Relative: 1 %
Eosinophils Absolute: 0.1 10*3/uL (ref 0.0–0.5)
Eosinophils Relative: 2 %
HCT: 40.4 % (ref 39.0–52.0)
Hemoglobin: 14.1 g/dL (ref 13.0–17.0)
Immature Granulocytes: 2 %
Lymphocytes Relative: 8 %
Lymphs Abs: 0.4 10*3/uL — ABNORMAL LOW (ref 0.7–4.0)
MCH: 32.4 pg (ref 26.0–34.0)
MCHC: 34.9 g/dL (ref 30.0–36.0)
MCV: 92.9 fL (ref 80.0–100.0)
Monocytes Absolute: 0.6 10*3/uL (ref 0.1–1.0)
Monocytes Relative: 12 %
Neutro Abs: 3.7 10*3/uL (ref 1.7–7.7)
Neutrophils Relative %: 75 %
Platelet Count: 245 10*3/uL (ref 150–400)
RBC: 4.35 MIL/uL (ref 4.22–5.81)
RDW: 13.9 % (ref 11.5–15.5)
WBC Count: 5 10*3/uL (ref 4.0–10.5)
nRBC: 0 % (ref 0.0–0.2)

## 2022-07-26 LAB — CMP (CANCER CENTER ONLY)
ALT: 15 U/L (ref 0–44)
AST: 15 U/L (ref 15–41)
Albumin: 4.2 g/dL (ref 3.5–5.0)
Alkaline Phosphatase: 77 U/L (ref 38–126)
Anion gap: 10 (ref 5–15)
BUN: 15 mg/dL (ref 8–23)
CO2: 24 mmol/L (ref 22–32)
Calcium: 9.1 mg/dL (ref 8.9–10.3)
Chloride: 104 mmol/L (ref 98–111)
Creatinine: 1.05 mg/dL (ref 0.61–1.24)
GFR, Estimated: >60 mL/min (ref >60)
Glucose, Bld: 106 mg/dL — ABNORMAL HIGH (ref 70–99)
Potassium: 4 mmol/L (ref 3.5–5.1)
Sodium: 138 mmol/L (ref 135–145)
Total Bilirubin: 0.7 mg/dL (ref 0.3–1.2)
Total Protein: 6.7 g/dL (ref 6.5–8.1)

## 2022-07-26 MED ORDER — SODIUM CHLORIDE 0.9 % IV SOLN
750.0000 mg/m2 | Freq: Once | INTRAVENOUS | Status: AC
  Administered 2022-07-26: 1780 mg via INTRAVENOUS
  Filled 2022-07-26: qty 89

## 2022-07-26 MED ORDER — DOXORUBICIN HCL CHEMO IV INJECTION 2 MG/ML
50.0000 mg/m2 | Freq: Once | INTRAVENOUS | Status: AC
  Administered 2022-07-26: 118 mg via INTRAVENOUS
  Filled 2022-07-26: qty 59

## 2022-07-26 MED ORDER — SODIUM CHLORIDE 0.9 % IV SOLN
375.0000 mg/m2 | Freq: Once | INTRAVENOUS | Status: AC
  Administered 2022-07-26: 900 mg via INTRAVENOUS
  Filled 2022-07-26: qty 50

## 2022-07-26 MED ORDER — ACETAMINOPHEN 325 MG PO TABS
650.0000 mg | ORAL_TABLET | Freq: Once | ORAL | Status: AC
  Administered 2022-07-26: 650 mg via ORAL
  Filled 2022-07-26: qty 2

## 2022-07-26 MED ORDER — HEPARIN SOD (PORK) LOCK FLUSH 100 UNIT/ML IV SOLN
500.0000 [IU] | Freq: Once | INTRAVENOUS | Status: AC | PRN
  Administered 2022-07-26: 500 [IU]

## 2022-07-26 MED ORDER — PALONOSETRON HCL INJECTION 0.25 MG/5ML
0.2500 mg | Freq: Once | INTRAVENOUS | Status: AC
  Administered 2022-07-26: 0.25 mg via INTRAVENOUS
  Filled 2022-07-26: qty 5

## 2022-07-26 MED ORDER — SODIUM CHLORIDE 0.9% FLUSH
10.0000 mL | Freq: Once | INTRAVENOUS | Status: AC
  Administered 2022-07-26: 10 mL

## 2022-07-26 MED ORDER — SODIUM CHLORIDE 0.9 % IV SOLN: Freq: Once | INTRAVENOUS | Status: AC

## 2022-07-26 MED ORDER — SODIUM CHLORIDE 0.9% FLUSH
10.0000 mL | INTRAVENOUS | Status: DC | PRN
  Administered 2022-07-26: 10 mL

## 2022-07-26 MED ORDER — PREDNISONE 20 MG PO TABS: 60.0000 mg | ORAL_TABLET | Freq: Every day | ORAL | 3 refills | Status: DC

## 2022-07-26 MED ORDER — SODIUM CHLORIDE 0.9 % IV SOLN
150.0000 mg | Freq: Once | INTRAVENOUS | Status: AC
  Administered 2022-07-26: 150 mg via INTRAVENOUS
  Filled 2022-07-26: qty 150

## 2022-07-26 MED ORDER — SODIUM CHLORIDE 0.9 % IV SOLN
10.0000 mg | Freq: Once | INTRAVENOUS | Status: AC
  Administered 2022-07-26: 10 mg via INTRAVENOUS
  Filled 2022-07-26: qty 10

## 2022-07-26 MED ORDER — CETIRIZINE HCL 10 MG/ML IV SOLN
10.0000 mg | Freq: Once | INTRAVENOUS | Status: AC
  Administered 2022-07-26: 10 mg via INTRAVENOUS
  Filled 2022-07-26: qty 1

## 2022-07-26 MED ORDER — VINCRISTINE SULFATE CHEMO INJECTION 1 MG/ML
2.0000 mg | Freq: Once | INTRAVENOUS | Status: AC
  Administered 2022-07-26: 2 mg via INTRAVENOUS
  Filled 2022-07-26: qty 2

## 2022-07-26 NOTE — Patient Instructions (Signed)
[facility]  Discharge Instructions: Thank you for choosing [facility] to provide your oncology and hematology care.   If you have a lab appointment with the [facility], please go directly to the [facility] and check in at the registration area.   Wear comfortable clothing and clothing appropriate for easy access to any Portacath or PICC line.   We strive to give you quality time with your provider. You may need to reschedule your appointment if you arrive late (15 or more minutes).  Arriving late affects you and other patients whose appointments are after yours.  Also, if you miss three or more appointments without notifying the office, you may be dismissed from the clinic at the provider's discretion.      For prescription refill requests, have your pharmacy contact our office and allow 72 hours for refills to be completed.    Today you received the following chemotherapy and/or immunotherapy agents: Doxorubicin (Adriamycin), Vincristine (Oncovin), Cyclophosphamide (Cytoxan), Rituximab (Ruxience)   To help prevent nausea and vomiting after your treatment, we encourage you to take your nausea medication as directed.  BELOW ARE SYMPTOMS THAT SHOULD BE REPORTED IMMEDIATELY: *FEVER GREATER THAN 100.4 F (38 C) OR HIGHER *CHILLS OR SWEATING *NAUSEA AND VOMITING THAT IS NOT CONTROLLED WITH YOUR NAUSEA MEDICATION *UNUSUAL SHORTNESS OF BREATH *UNUSUAL BRUISING OR BLEEDING *URINARY PROBLEMS (pain or burning when urinating, or frequent urination) *BOWEL PROBLEMS (unusual diarrhea, constipation, pain near the anus) TENDERNESS IN MOUTH AND THROAT WITH OR WITHOUT PRESENCE OF ULCERS (sore throat, sores in mouth, or a toothache) UNUSUAL RASH, SWELLING OR PAIN  UNUSUAL VAGINAL DISCHARGE OR ITCHING   Items with * indicate a potential emergency and should be followed up as soon as possible or go to the Emergency Department if any problems should  occur.  Please show the CHEMOTHERAPY ALERT CARD or IMMUNOTHERAPY ALERT CARD at check-in to the Emergency Department and triage nurse.  Should you have questions after your visit or need to cancel or reschedule your appointment, please contact [facility]  Dept: [phone]  and follow the prompts.  Office hours are 8:00 a.m. to 4:30 p.m. Monday - Friday. Please note that voicemails left after 4:00 p.m. may not be returned until the following business day.  We are closed weekends and major holidays. You have access to a nurse at all times for urgent questions. Please call the main number to the clinic Dept: [phone] and follow the prompts.   For any non-urgent questions, you may also contact your provider using MyChart. We now offer e-Visits for anyone 28 and older to request care online for non-urgent symptoms. For details visit [email or web address].   Also download the MyChart app! Go to the app store, search "MyChart", open the app, select , and log in with your MyChart username and password.  Masks are optional in the cancer centers. If you would like for your care team to wear a mask while they are taking care of you, please let them know. You may have one support person who is at least 68 years old accompany you for your appointments.

## 2022-07-26 NOTE — Progress Notes (Signed)
Good blood return before, during and after adriamycin infusion.

## 2022-07-26 NOTE — Progress Notes (Signed)
[place] [phone]   Fax:([phone]  PROGRESS NOTE  Patient Care Team: Jorge Slick, MD as PCP - General (Hematology and Oncology)  Hematological/Oncological History # Follicular Lymphoma. Stage III 1) 01/07/2020: CT Neck W contrast showed cervical lymphadenopathy bilaterally, left greater than right. Numerous enlarged lymph nodes are present 2)  01/22/2020: [place] Core biopsy performed which revealed overall features consistent with involvement by non-Hodgkin's B-cell lymphoma and the morphologic and phenotypic findings favor follicular lymphoma.  3) 01/31/2020: establish care with Jorge Gould  4) 02/12/2020: PET CT scan demonstrates bulky adenopathy in the neck, chest, abdomen and pelvis and diffuse skeletal uptake is suspicious for (but not diagnostic) of marrow involvement. 5) 04/30/2020: Cycle 1 Day 1 of Bendamustine/Rituximab 6) 05/29/2020: Cycle 2 Day 1 of Bendamustine/Rituximab 7) 06/25/2020: Cycle 3 Day 1 of Bendamustine/Rituximab 8) 07/23/2020: Cycle 4 Day 1 of Bendamustine/Rituximab 9) 08/25/2020: Cycle 5 Day 1 of Bendamustine/Rituximab 10)09/17/2020: Cycle 6 Day 1 of Bendamustine/Rituximab 11) 10/26/2020: PET CT scan shows partial response (Deauville Score 3 in lymph nodes but some residual FDG avidity in the bone marrow).  12) 04/27/2021: CT scan showed interval decreased adenopathy below the diaphragm, consistent with treatment response. No new or enlarging suspicious lymph nodes 13) 10/27/2021: CT scan showed increased left inguinal adenopathy, including a 1.6 cm left inguinal lymph node which was previously 0.9 cm in short axis. 14) 04/30/2022: CT scan shows progressive adenopathy below the diaphragm with new thoracic adenopathy and increased hazy retroperitoneal stranding, consistent with worsening lymphomatous disease. 15) 05/05/2022: Doppler US of lower extremities revealed bilateral lymph nodes in the groin.   16) 06/10/2022: Echo: EF 60-65% 17)  06/29/2022: Cycle 1 of R-CHOP 18) 07/26/2022: Cycle 2 of R-CHOP  Interval History:  Jorge Gould 66 y.o. male with medical history significant for Stage III follicular lymphoma who presents for a follow up visit. The patient's last visit was on 07/26/2022. In the interim since the last visit, he started Cycle 1 of R-CHOP and had a CT scan which showed stable disease.   On exam today Jorge Gould reports that he has been well in the interim since her last visit.  Unfortunately due to misunderstanding he was taking steroids and appropriately.  He was taking 60 mg p.o. daily for the entire course.  He is taking his daily since cycle 1 day 1.  He reports that he has felt well otherwise.  He notes that his leg is improving and that the left cervical lymph node has decreased in size.  He reports he is not having infectious symptoms such as fevers, chills, sweats.  He notes his appetite is been good and his weight has been steady.  He denies easy bruising or signs of active bleeding.  He has no other complaints.  Rest of 10 point ROS is below.  Overall he is willing and able to proceed with treatment today.   MEDICAL HISTORY:  Past Medical History:  Diagnosis Date   Cancer ([place]) 3875   follicular lymphoma   Obesity     SURGICAL HISTORY: Past Surgical History:  Procedure Laterality Date   ANKLE FRACTURE SURGERY  2016   right, trauma repair after fall down stairs   HERNIA REPAIR     umbilical   IR IMAGING GUIDED PORT INSERTION  04/09/2020   IR IMAGING GUIDED PORT INSERTION  06/17/2022   IR REMOVAL TUN ACCESS W/ PORT W/O FL MOD SED  11/27/2020   TONSILLECTOMY      SOCIAL  HISTORY: Social History   Socioeconomic History   Marital status: Married    Spouse name: Not on file   Number of children: Not on file   Years of education: Not on file   Highest education level: Not on file  Occupational History   Not on file  Tobacco Use   Smoking status: Never   Smokeless tobacco: Never  Vaping Use    Vaping Use: Never used  Substance and Sexual Activity   Alcohol use: Yes    Alcohol/week: 3.0 standard drinks of alcohol    Types: 3 Shots of liquor per week   Drug use: Not Currently   Sexual activity: Not on file  Other Topics Concern   Not on file  Social History Narrative   Engaged, plan to marry in March 2021, been together since 2014.  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   Most exercise on the job, working 7 days per week.   No children, but fiance has 5 children.    11/2019   Social Determinants of Health   Financial Resource Strain: Not on file  Food Insecurity: Not on file  Transportation Needs: Not on file  Physical Activity: Not on file  Stress: Not on file  Social Connections: Not on file  Intimate Partner Violence: Not on file    FAMILY HISTORY: Family History  Problem Relation Age of Onset   Cancer Mother        lung; former smoker   COPD Father        emphysema   Diabetes Maternal Aunt    Heart disease Maternal Uncle    Heart disease Maternal Uncle    Diabetes Maternal Aunt    Stroke Neg Hx    Hyperlipidemia Neg Hx    Hypertension Neg Hx     ALLERGIES:  has No Known Allergies.  MEDICATIONS:  Current Outpatient Medications  Medication Sig Dispense Refill   amLODipine (NORVASC) 5 MG tablet Take 1 tablet (5 mg total) by mouth daily. 30 tablet 3   acetaminophen (TYLENOL) 500 MG tablet Take 2 tablets (1,000 mg total) by mouth every 6 (six) hours as needed. 30 tablet 0   Guaifenesin 1200 MG TB12 Take 1 tablet (1,200 mg total) by mouth in the morning and at bedtime. (Patient not taking: Reported on 05/03/2022) 14 tablet 0   Naphazoline HCl (CLEAR EYES OP) Place 1 drop into both eyes daily as needed (redness). (Patient not taking: Reported on 05/28/2020)     ondansetron (ZOFRAN) 8 MG tablet Take 1 tablet (8 mg total) by mouth every 8 (eight) hours as needed for nausea or vomiting. 90 tablet 0   predniSONE (DELTASONE) 20 MG tablet Take 3 tablets  (60 mg total) by mouth daily. On days 1-5 after chemotherapy. 90 tablet 3   prochlorperazine (COMPAZINE) 10 MG tablet TAKE 1 TABLET(10 MG) BY MOUTH EVERY 6 HOURS AS NEEDED FOR NAUSEA OR VOMITING 90 tablet 0   promethazine-dextromethorphan (PROMETHAZINE-DM) 6.25-15 MG/5ML syrup Take 2.5 mLs by mouth 4 (four) times daily as needed for cough. 118 mL 0   sildenafil (VIAGRA) 100 MG tablet Take 0.5-1 tablets (50-100 mg total) by mouth daily as needed for erectile dysfunction. 10 tablet 0   No current facility-administered medications for this visit.    REVIEW OF SYSTEMS:   Constitutional: ( - ) fevers, ( - )  chills , ( - ) night sweats Eyes: ( - ) blurriness of vision, ( - ) double vision, ( - )  watery eyes Ears, nose, mouth, throat, and face: ( - ) mucositis, ( - ) sore throat Respiratory: ( - ) cough, ( - ) dyspnea, ( - ) wheezes Cardiovascular: ( - ) palpitation, ( - ) chest discomfort, ( - ) lower extremity swelling Gastrointestinal:  ( - ) nausea, ( - ) heartburn, ( - ) change in bowel habits Skin: ( - ) abnormal skin rashes Lymphatics: ( - ) new lymphadenopathy, ( - ) easy bruising Neurological: ( - ) numbness, ( - ) tingling, ( - ) new weaknesses Behavioral/Psych: ( - ) mood change, ( - ) new changes  All other systems were reviewed with the patient and are negative.  PHYSICAL EXAMINATION: ECOG PERFORMANCE STATUS: 1 - Symptomatic but completely ambulatory  Vitals:   07/26/22 0957  BP: (!) 179/91  Pulse: (!) 45  Resp: 17  Temp: (!) 97.4 F (36.3 C)  SpO2: 99%    Filed Weights   07/26/22 0957  Weight: 241 lb 11.2 oz (109.6 kg)     GENERAL: well appearing middle aged Caucasian male in NAD  SKIN: skin color, texture, turgor are normal, no rashes or significant lesions EYES: conjunctiva are pink and non-injected, sclera clear LUNGS: clear to auscultation and percussion with normal breathing effort HEART: regular rate & rhythm and no murmurs and no lower extremity  edema Musculoskeletal: no cyanosis of digits and no clubbing  PSYCH: alert & oriented x 3, fluent speech NEURO: no focal motor/sensory deficits  LABORATORY DATA:  I have reviewed the data as listed    Latest Ref Rng & Units 07/26/2022    8:41 AM 06/29/2022    8:36 AM 05/03/2022    2:32 PM  CBC  WBC 4.0 - 10.5 K/uL 5.0  5.4  7.4   Hemoglobin 13.0 - 17.0 g/dL 14.1  14.6  14.2   Hematocrit 39.0 - 52.0 % 40.4  40.8  40.5   Platelets 150 - 400 K/uL 245  181  217        Latest Ref Rng & Units 07/26/2022    8:41 AM 06/29/2022    8:36 AM 05/03/2022    2:32 PM  CMP  Glucose 70 - 99 mg/dL 106  101  96   BUN 8 - 23 mg/dL '15  13  17   '$ Creatinine 0.61 - 1.24 mg/dL 1.05  1.00  1.24   Sodium 135 - 145 mmol/L 138  140  140   Potassium 3.5 - 5.1 mmol/L 4.0  4.1  4.3   Chloride 98 - 111 mmol/L 104  106  105   CO2 22 - 32 mmol/L '24  29  28   '$ Calcium 8.9 - 10.3 mg/dL 9.1  9.6  9.6   Total Protein 6.5 - 8.1 g/dL 6.7  6.7  6.9   Total Bilirubin 0.3 - 1.2 mg/dL 0.7  0.8  0.6   Alkaline Phos 38 - 126 U/L 77  74  72   AST 15 - 41 U/L '15  12  13   '$ ALT 0 - 44 U/L '15  11  16    '$ RADIOGRAPHIC STUDIES: I have personally reviewed the radiological images as listed and agreed with the findings in the report: Marked response in the patient's lymphadenopathy and decrease in his FDG avidity.  Bone marrow involvement also appears considerably less.  Overall consistent with a partial response.  CT CHEST ABDOMEN PELVIS W CONTRAST  Result Date: 07/24/2022 CLINICAL DATA:  History of follicular lymphoma. Follow-up exam. *  Tracking Code: BO * EXAM: CT CHEST, ABDOMEN, AND PELVIS WITH CONTRAST TECHNIQUE: Multidetector CT imaging of the chest, abdomen and pelvis was performed following the standard protocol during bolus administration of intravenous contrast. RADIATION DOSE REDUCTION: This exam was performed according to the departmental dose-optimization program which includes automated exposure control, adjustment of the  mA and/or kV according to patient size and/or use of iterative reconstruction technique. CONTRAST:  136m OMNIPAQUE IOHEXOL 300 MG/ML  SOLN COMPARISON:  CT C AP April 30, 2022 FINDINGS: CT CHEST FINDINGS Cardiovascular: Right anterior chest wall Port-A-Cath is present with tip terminating in the superior vena cava. Normal heart size. Thoracic aortic vascular calcifications. Ascending thoracic aorta measures 4.0 cm, unchanged. Mediastinum/Nodes: Similar bilateral axillary lymphadenopathy. Reference left axillary lymph node measures 2.4 x 1.5 cm (image 13; series 2), previously 2.4 x 1.5 cm. Reference right axillary lymph node measures 1.5 x 1.9 cm (image 11; series 2), previously 1.9 x 1.3 cm. Normal appearance of the esophagus. Interval decrease in size of left axillary lymph node measuring 1.0 x 0.8 cm, previously 1.5 x 1.6 cm. Lungs/Pleura: The central airways are patent. Dependent atelectasis within the bilateral lower lobes. Stable 3 mm right lower lobe pulmonary nodule (image 89; series 4). No pleural effusion or pneumothorax. Musculoskeletal: Thoracic spine degenerative changes. No aggressive or acute appearing osseous lesions. CT ABDOMEN PELVIS FINDINGS Hepatobiliary: Liver is normal in size and contour. No focal lesion. Gallbladder is unremarkable. No intrahepatic or extrahepatic biliary ductal dilatation. Pancreas: Unremarkable Spleen: Unremarkable Adrenals/Urinary Tract: Unchanged 14 mm right adrenal nodule. Normal left adrenal gland. Grossly similar perinephric fat stranding as well as soft tissue about the superior poles of the kidneys bilaterally, left-greater-than-right measuring up to 20 mm overlying the left kidney and 10 mm overlying the right kidney. Urinary bladder is unremarkable. Stomach/Bowel: Normal appendix. Oral contrast material within the small bowel. No evidence for bowel obstruction. No free fluid or free intraperitoneal air. Vascular/Lymphatic: Normal caliber abdominal aorta. Persistent  retroperitoneal soft tissue and fat stranding. Similar left para-aortic node measuring 5.3 x 3.8 cm (image 74; series 2), previously 5.2 x 3.8 cm. Similar left inguinal lymph node measuring 3.0 x 3.4 cm (image 126; series 2), previously 3.3 x 3.2 cm. Reproductive: Heterogeneous prostate. Other: Small fat containing left inguinal hernia. Small fat containing ventral abdominal wall hernia. Musculoskeletal: Lumbar spine degenerative changes. No aggressive or acute appearing osseous lesions. IMPRESSION: Interval decrease in size of 1 left axillary lymph node. Otherwise, grossly similar adenopathy within the chest, abdomen and pelvis. Similar-appearing perirenal soft tissue bilaterally. Ascending thoracic aorta measures 4.0 cm. Recommend annual imaging followup by CTA or MRA. This recommendation follows 2010 ACCF/AHA/AATS/ACR/ASA/SCA/SCAI/SIR/STS/SVM Guidelines for the Diagnosis and Management of Patients with Thoracic Aortic Disease. Circulation. 2010; 121:: K025-K270 Aortic aneurysm NOS (ICD10-I71.9) Electronically Signed   By: DLovey NewcomerM.D.   On: 07/24/2022 07:26    ASSESSMENT & PLAN MTREMAYNE SHELDON89y.o. male with medical history significant for diagnosed follicular lymphoma who presents for a follow up visit.   He completed 6 cycles of Bendamustine/Rituximab therapy from 04/30/2020-09/17/2020. Repeat PET CT scan on 10/26/2020 showed a partial response. OK to enter observation.   CT scan from 04/30/2022 and doppler [place] 05/05/2022 showed enlarging adenopathy. Recommended second line therapy with R-CHOP. Regimen consists of Rituximab 375 mg/m2, cytoxan 750 mg/m2, vincristine 2 mg IV, adriamycin 50 mg/m2 along with prednisone 60 mg PO on days 1-5. This regimen is given every 21 days.   # Follicular Lymphoma. Stage III --  patient completed Cycle 6 of R-Benda therapy. This was administered with curative intent.  --PET CT scan after 6 cycles of R-Benda showed substantial partial response. Residual mild  hypermetabolism within the retroperitoneal and left common iliac chains (Deauville score 3). No residual metabolically active adenopathy in the neck or chest --per [place] Criteria, patient is a Partial Response.  --CT neck and CAP from 04/30/2022 showed progressive adenopathy.  --Doppler  US from 05/05/2022 showed progressive inguinal adenopathy, bilateral. --Recommend systemic therapy with R-CHOP, started today 06/29/2022 Plan:  --Due to start Cycle 1 of R-CHOP today --Baseline ECHO from 06/10/2022 shows EF 60-65% --Labs from today were reviewed and adequate for treatment.  -- labs today show white blood cell 5.0, hemoglobin 14.1, MCV 92.9, and platelets of 245 --Proceed with treatment today without any modifications --RTC in 3 weeks for labs, f/u visit with Jorge Gould before Cycle 3 of R-CHOP.   #Steroid Tapering -- Due to misunderstanding patient has been taking 60 mg of dexamethasone daily to the start of treatment. -- Would recommend to continue 60 mg p.o. daily for days 1 through 5 of the cycle and then decrease down to 40 mg for 1 week and 20 mg for the next week. -- During his next cycle we will proceed with steroids as prescribed.  # Left Lower Leg Swelling, improved --Patient has no more redness and +1 pitting edema of left lower extremity --Doppler US from 05/05/2022 showed no evidence of DVT. There are enlarged lymph nodes noted in the groin, bilaterally which is likely causing the swelling.   #Supportive Care -- port placement done -- zofran '8mg'$  q8H PRN and compazine '10mg'$  PO q6H for nausea -- EMLA cream for port -- no pain medication required at this time.   No orders of the defined types were placed in this encounter.   All questions were answered. The patient knows to call the clinic with any problems, questions or concerns.  I have spent a total of 30 minutes minutes of face-to-face and non-face-to-face time, preparing to see the patient,  performing a medically  appropriate examination, counseling and educating the patient, ordering medications, documenting clinical information in the electronic health record, and care coordination.   Ledell Peoples, MD Department of Hematology/Oncology [place] [facility] Phone: [phone] Pager: [phone] Email: [email or web address]  07/27/2022 2:10 PM

## 2022-07-27 ENCOUNTER — Encounter: Payer: Self-pay | Admitting: Hematology and Oncology

## 2022-07-27 ENCOUNTER — Other Ambulatory Visit: Payer: Self-pay

## 2022-07-27 MED ORDER — AMLODIPINE BESYLATE 5 MG PO TABS: 5.0000 mg | ORAL_TABLET | Freq: Every day | ORAL | 3 refills | Status: DC

## 2022-07-28 ENCOUNTER — Inpatient Hospital Stay: Payer: Medicare Other

## 2022-07-28 VITALS — BP 184/91 | HR 55 | Temp 98.0°F | Resp 18

## 2022-07-28 DIAGNOSIS — Z5111 Encounter for antineoplastic chemotherapy: Secondary | ICD-10-CM | POA: Diagnosis not present

## 2022-07-28 DIAGNOSIS — C8233 Follicular lymphoma grade IIIa, intra-abdominal lymph nodes: Secondary | ICD-10-CM

## 2022-07-28 MED ORDER — PEGFILGRASTIM-CBQV 6 MG/0.6ML ~~LOC~~ SOSY
6.0000 mg | PREFILLED_SYRINGE | Freq: Once | SUBCUTANEOUS | Status: AC
  Administered 2022-07-28: 6 mg via SUBCUTANEOUS
  Filled 2022-07-28: qty 0.6

## 2022-08-02 ENCOUNTER — Other Ambulatory Visit: Payer: Self-pay | Admitting: *Deleted

## 2022-08-02 ENCOUNTER — Ambulatory Visit: Payer: Medicare Other | Admitting: Hematology and Oncology

## 2022-08-02 ENCOUNTER — Other Ambulatory Visit: Payer: Medicare Other

## 2022-08-02 MED ORDER — ONDANSETRON HCL 8 MG PO TABS: 8.0000 mg | ORAL_TABLET | Freq: Three times a day (TID) | ORAL | 1 refills | Status: DC | PRN

## 2022-08-02 MED ORDER — PREDNISONE 20 MG PO TABS: 60.0000 mg | ORAL_TABLET | Freq: Every day | ORAL | 3 refills | Status: DC

## 2022-08-02 NOTE — Progress Notes (Signed)
Medication management provided for patient

## 2022-08-10 ENCOUNTER — Inpatient Hospital Stay: Payer: Medicare Other | Admitting: Hematology and Oncology

## 2022-08-10 ENCOUNTER — Inpatient Hospital Stay: Payer: Medicare Other

## 2022-08-12 ENCOUNTER — Ambulatory Visit: Payer: Medicare Other

## 2022-08-16 MED FILL — Fosaprepitant Dimeglumine For IV Infusion 150 MG (Base Eq): INTRAVENOUS | Qty: 5 | Status: AC

## 2022-08-16 MED FILL — Dexamethasone Sodium Phosphate Inj 100 MG/10ML: INTRAMUSCULAR | Qty: 1 | Status: AC

## 2022-08-17 ENCOUNTER — Inpatient Hospital Stay: Payer: Medicare Other | Attending: Hematology and Oncology

## 2022-08-17 ENCOUNTER — Inpatient Hospital Stay (HOSPITAL_BASED_OUTPATIENT_CLINIC_OR_DEPARTMENT_OTHER): Payer: Medicare Other | Admitting: Hematology and Oncology

## 2022-08-17 ENCOUNTER — Other Ambulatory Visit: Payer: Self-pay

## 2022-08-17 ENCOUNTER — Inpatient Hospital Stay: Payer: Medicare Other

## 2022-08-17 VITALS — BP 121/77 | HR 62 | Temp 98.3°F | Resp 17

## 2022-08-17 VITALS — BP 154/96 | HR 58 | Temp 98.0°F | Resp 16 | Wt 237.9 lb

## 2022-08-17 DIAGNOSIS — C8220 Follicular lymphoma grade III, unspecified, unspecified site: Secondary | ICD-10-CM | POA: Diagnosis present

## 2022-08-17 DIAGNOSIS — Z7952 Long term (current) use of systemic steroids: Secondary | ICD-10-CM | POA: Insufficient documentation

## 2022-08-17 DIAGNOSIS — Z5111 Encounter for antineoplastic chemotherapy: Secondary | ICD-10-CM | POA: Diagnosis present

## 2022-08-17 DIAGNOSIS — Z95828 Presence of other vascular implants and grafts: Secondary | ICD-10-CM

## 2022-08-17 DIAGNOSIS — Z79899 Other long term (current) drug therapy: Secondary | ICD-10-CM | POA: Insufficient documentation

## 2022-08-17 DIAGNOSIS — C8233 Follicular lymphoma grade IIIa, intra-abdominal lymph nodes: Secondary | ICD-10-CM

## 2022-08-17 DIAGNOSIS — Z5112 Encounter for antineoplastic immunotherapy: Secondary | ICD-10-CM | POA: Insufficient documentation

## 2022-08-17 DIAGNOSIS — Z5189 Encounter for other specified aftercare: Secondary | ICD-10-CM | POA: Diagnosis not present

## 2022-08-17 LAB — CMP (CANCER CENTER ONLY)
ALT: 13 U/L (ref 0–44)
AST: 13 U/L — ABNORMAL LOW (ref 15–41)
Albumin: 4.3 g/dL (ref 3.5–5.0)
Alkaline Phosphatase: 78 U/L (ref 38–126)
Anion gap: 5 (ref 5–15)
BUN: 18 mg/dL (ref 8–23)
CO2: 28 mmol/L (ref 22–32)
Calcium: 9.2 mg/dL (ref 8.9–10.3)
Chloride: 107 mmol/L (ref 98–111)
Creatinine: 0.92 mg/dL (ref 0.61–1.24)
GFR, Estimated: >60 mL/min (ref >60)
Glucose, Bld: 130 mg/dL — ABNORMAL HIGH (ref 70–99)
Potassium: 3.7 mmol/L (ref 3.5–5.1)
Sodium: 140 mmol/L (ref 135–145)
Total Bilirubin: 0.7 mg/dL (ref 0.3–1.2)
Total Protein: 6.8 g/dL (ref 6.5–8.1)

## 2022-08-17 LAB — CBC WITH DIFFERENTIAL (CANCER CENTER ONLY)
Abs Immature Granulocytes: 0.04 10*3/uL (ref 0.00–0.07)
Basophils Absolute: 0.1 10*3/uL (ref 0.0–0.1)
Basophils Relative: 1 %
Eosinophils Absolute: 0.1 10*3/uL (ref 0.0–0.5)
Eosinophils Relative: 2 %
HCT: 37.1 % — ABNORMAL LOW (ref 39.0–52.0)
Hemoglobin: 13.3 g/dL (ref 13.0–17.0)
Immature Granulocytes: 1 %
Lymphocytes Relative: 7 %
Lymphs Abs: 0.4 10*3/uL — ABNORMAL LOW (ref 0.7–4.0)
MCH: 33.5 pg (ref 26.0–34.0)
MCHC: 35.8 g/dL (ref 30.0–36.0)
MCV: 93.5 fL (ref 80.0–100.0)
Monocytes Absolute: 0.6 10*3/uL (ref 0.1–1.0)
Monocytes Relative: 12 %
Neutro Abs: 4.2 10*3/uL (ref 1.7–7.7)
Neutrophils Relative %: 77 %
Platelet Count: 266 10*3/uL (ref 150–400)
RBC: 3.97 MIL/uL — ABNORMAL LOW (ref 4.22–5.81)
RDW: 14.6 % (ref 11.5–15.5)
WBC Count: 5.4 10*3/uL (ref 4.0–10.5)
nRBC: 0 % (ref 0.0–0.2)

## 2022-08-17 MED ORDER — PALONOSETRON HCL INJECTION 0.25 MG/5ML
0.2500 mg | Freq: Once | INTRAVENOUS | Status: AC
  Administered 2022-08-17: 0.25 mg via INTRAVENOUS
  Filled 2022-08-17: qty 5

## 2022-08-17 MED ORDER — SODIUM CHLORIDE 0.9% FLUSH
10.0000 mL | Freq: Once | INTRAVENOUS | Status: AC
  Administered 2022-08-17: 10 mL

## 2022-08-17 MED ORDER — HEPARIN SOD (PORK) LOCK FLUSH 100 UNIT/ML IV SOLN
500.0000 [IU] | Freq: Once | INTRAVENOUS | Status: AC | PRN
  Administered 2022-08-17: 500 [IU]

## 2022-08-17 MED ORDER — SODIUM CHLORIDE 0.9 % IV SOLN
750.0000 mg/m2 | Freq: Once | INTRAVENOUS | Status: AC
  Administered 2022-08-17: 1780 mg via INTRAVENOUS
  Filled 2022-08-17: qty 89

## 2022-08-17 MED ORDER — CETIRIZINE HCL 10 MG/ML IV SOLN
10.0000 mg | Freq: Once | INTRAVENOUS | Status: AC
  Administered 2022-08-17: 10 mg via INTRAVENOUS
  Filled 2022-08-17: qty 1

## 2022-08-17 MED ORDER — DOXORUBICIN HCL CHEMO IV INJECTION 2 MG/ML
50.0000 mg/m2 | Freq: Once | INTRAVENOUS | Status: AC
  Administered 2022-08-17: 118 mg via INTRAVENOUS
  Filled 2022-08-17: qty 59

## 2022-08-17 MED ORDER — SODIUM CHLORIDE 0.9 % IV SOLN
150.0000 mg | Freq: Once | INTRAVENOUS | Status: AC
  Administered 2022-08-17: 150 mg via INTRAVENOUS
  Filled 2022-08-17: qty 150

## 2022-08-17 MED ORDER — SODIUM CHLORIDE 0.9% FLUSH
10.0000 mL | INTRAVENOUS | Status: DC | PRN
  Administered 2022-08-17: 10 mL

## 2022-08-17 MED ORDER — VINCRISTINE SULFATE CHEMO INJECTION 1 MG/ML
2.0000 mg | Freq: Once | INTRAVENOUS | Status: AC
  Administered 2022-08-17: 2 mg via INTRAVENOUS
  Filled 2022-08-17: qty 2

## 2022-08-17 MED ORDER — SODIUM CHLORIDE 0.9 % IV SOLN
10.0000 mg | Freq: Once | INTRAVENOUS | Status: AC
  Administered 2022-08-17: 10 mg via INTRAVENOUS
  Filled 2022-08-17: qty 10

## 2022-08-17 MED ORDER — SODIUM CHLORIDE 0.9 % IV SOLN: Freq: Once | INTRAVENOUS | Status: AC

## 2022-08-17 MED ORDER — SODIUM CHLORIDE 0.9 % IV SOLN
375.0000 mg/m2 | Freq: Once | INTRAVENOUS | Status: AC
  Administered 2022-08-17: 900 mg via INTRAVENOUS
  Filled 2022-08-17: qty 40

## 2022-08-17 MED ORDER — ACETAMINOPHEN 325 MG PO TABS
650.0000 mg | ORAL_TABLET | Freq: Once | ORAL | Status: AC
  Administered 2022-08-17: 650 mg via ORAL
  Filled 2022-08-17: qty 2

## 2022-08-17 NOTE — Patient Instructions (Signed)
[facility]  Discharge Instructions: Thank you for choosing [facility] to provide your oncology and hematology care.   If you have a lab appointment with the [facility], please go directly to the [facility] and check in at the registration area.   Wear comfortable clothing and clothing appropriate for easy access to any Portacath or PICC line.   We strive to give you quality time with your provider. You may need to reschedule your appointment if you arrive late (15 or more minutes).  Arriving late affects you and other patients whose appointments are after yours.  Also, if you miss three or more appointments without notifying the office, you may be dismissed from the clinic at the provider's discretion.      For prescription refill requests, have your pharmacy contact our office and allow 72 hours for refills to be completed.    Today you received the following chemotherapy and/or immunotherapy agents: Doxorubicin (Adriamycin), Vincristine (Oncovin), Cyclophosphamide (Cytoxan), Rituximab (Ruxience)   To help prevent nausea and vomiting after your treatment, we encourage you to take your nausea medication as directed.  BELOW ARE SYMPTOMS THAT SHOULD BE REPORTED IMMEDIATELY: *FEVER GREATER THAN 100.4 F (38 C) OR HIGHER *CHILLS OR SWEATING *NAUSEA AND VOMITING THAT IS NOT CONTROLLED WITH YOUR NAUSEA MEDICATION *UNUSUAL SHORTNESS OF BREATH *UNUSUAL BRUISING OR BLEEDING *URINARY PROBLEMS (pain or burning when urinating, or frequent urination) *BOWEL PROBLEMS (unusual diarrhea, constipation, pain near the anus) TENDERNESS IN MOUTH AND THROAT WITH OR WITHOUT PRESENCE OF ULCERS (sore throat, sores in mouth, or a toothache) UNUSUAL RASH, SWELLING OR PAIN  UNUSUAL VAGINAL DISCHARGE OR ITCHING   Items with * indicate a potential emergency and should be followed up as soon as possible or go to the Emergency Department if any problems should  occur.  Please show the CHEMOTHERAPY ALERT CARD or IMMUNOTHERAPY ALERT CARD at check-in to the Emergency Department and triage nurse.  Should you have questions after your visit or need to cancel or reschedule your appointment, please contact [facility]  Dept: [phone]  and follow the prompts.  Office hours are 8:00 a.m. to 4:30 p.m. Monday - Friday. Please note that voicemails left after 4:00 p.m. may not be returned until the following business day.  We are closed weekends and major holidays. You have access to a nurse at all times for urgent questions. Please call the main number to the clinic Dept: [phone] and follow the prompts.   For any non-urgent questions, you may also contact your provider using MyChart. We now offer e-Visits for anyone 11 and older to request care online for non-urgent symptoms. For details visit [email or web address].   Also download the MyChart app! Go to the app store, search "MyChart", open the app, select [facility], and log in with your MyChart username and password.  Masks are optional in the cancer centers. If you would like for your care team to wear a mask while they are taking care of you, please let them know. You may have one support Jorge Gould who is at least 68 years old accompany you for your appointments.

## 2022-08-17 NOTE — Progress Notes (Signed)
[facility] [phone]   [phone]  PROGRESS NOTE  Patient Care Team: Orson Slick, MD as PCP - General (Hematology and Oncology)  Hematological/Oncological History # Follicular Lymphoma. Stage III 1) 01/07/2020: CT Neck W contrast showed cervical lymphadenopathy bilaterally, left greater than right. Numerous enlarged lymph nodes are present 2)  01/22/2020: [place] Core biopsy performed which revealed overall features consistent with involvement by non-Hodgkin's B-cell lymphoma and the morphologic and phenotypic findings favor follicular lymphoma.  3) 01/31/2020: establish care with Dr. Lorenso Courier  4) 02/12/2020: PET CT scan demonstrates bulky adenopathy in the neck, chest, abdomen and pelvis and diffuse skeletal uptake is suspicious for (but not diagnostic) of marrow involvement. 5) 04/30/2020: Cycle 1 Day 1 of Bendamustine/Rituximab 6) 05/29/2020: Cycle 2 Day 1 of Bendamustine/Rituximab 7) 06/25/2020: Cycle 3 Day 1 of Bendamustine/Rituximab 8) 07/23/2020: Cycle 4 Day 1 of Bendamustine/Rituximab 9) 08/25/2020: Cycle 5 Day 1 of Bendamustine/Rituximab 10)09/17/2020: Cycle 6 Day 1 of Bendamustine/Rituximab 11) 10/26/2020: PET CT scan shows partial response (Deauville Score 3 in lymph nodes but some residual FDG avidity in the bone marrow).  12) 04/27/2021: CT scan showed interval decreased adenopathy below the diaphragm, consistent with treatment response. No new or enlarging suspicious lymph nodes 13) 10/27/2021: CT scan showed increased left inguinal adenopathy, including a 1.6 cm left inguinal lymph node which was previously 0.9 cm in short axis. 14) 04/30/2022: CT scan shows progressive adenopathy below the diaphragm with new thoracic adenopathy and increased hazy retroperitoneal stranding, consistent with worsening lymphomatous disease. 15) 05/05/2022: Doppler US of lower extremities revealed bilateral lymph nodes in the groin.   16) 06/10/2022: Echo: EF 60-65% 17)  06/29/2022: Cycle 1 of R-CHOP 18) 07/26/2022: Cycle 2 of R-CHOP 19) 08/17/2022: Cycle 3 of R-CHOP  Interval History:  Jorge Gould 66 y.o. male with medical history significant for Stage III follicular lymphoma who presents for a follow up visit. The patient's last visit was on 07/26/2022. In the interim since the last visit, he completed Cycle 2 of R-CHOP.  On exam today Jorge Gould reports he has straightened out his medications and is taking the prednisone and Zofran appropriately.  He reports he tolerated his last round of chemotherapy without any difficulty.  He does not have any trouble with nausea, vomiting, or diarrhea.  His energy has been "hanging in there".  He does occasionally get winded when trying to walk up large flights of stairs.  He notes his weight has dropped approximately 5 pounds in the interim since her last visit.  His appetite has been strong and he "eats whenever he can".  He has no other complaints.  Rest of 10 point ROS is below.  Overall he is willing and able to proceed with treatment today.   MEDICAL HISTORY:  Past Medical History:  Diagnosis Date   Cancer ([place]) 0630   follicular lymphoma   Obesity     SURGICAL HISTORY: Past Surgical History:  Procedure Laterality Date   ANKLE FRACTURE SURGERY  2016   right, trauma repair after fall down stairs   HERNIA REPAIR     umbilical   IR IMAGING GUIDED PORT INSERTION  04/09/2020   IR IMAGING GUIDED PORT INSERTION  06/17/2022   IR REMOVAL TUN ACCESS W/ PORT W/O FL MOD SED  11/27/2020   TONSILLECTOMY      SOCIAL HISTORY: Social History   Socioeconomic History   Marital status: Married    Spouse name: Not on file   Number of children:  Not on file   Years of education: Not on file   Highest education level: Not on file  Occupational History   Not on file  Tobacco Use   Smoking status: Never   Smokeless tobacco: Never  Vaping Use   Vaping Use: Never used  Substance and Sexual Activity   Alcohol use: Yes     Alcohol/week: 3.0 standard drinks of alcohol    Types: 3 Shots of liquor per week   Drug use: Not Currently   Sexual activity: Not on file  Other Topics Concern   Not on file  Social History Narrative   Engaged, plan to marry in March 2021, been together since 2014.  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   Most exercise on the job, working 7 days per week.   No children, but fiance has 5 children.    11/2019   Social Determinants of Health   Financial Resource Strain: Not on file  Food Insecurity: Not on file  Transportation Needs: Not on file  Physical Activity: Not on file  Stress: Not on file  Social Connections: Not on file  Intimate Partner Violence: Not on file    FAMILY HISTORY: Family History  Problem Relation Age of Onset   Cancer Mother        lung; former smoker   COPD Father        emphysema   Diabetes Maternal Aunt    Heart disease Maternal Uncle    Heart disease Maternal Uncle    Diabetes Maternal Aunt    Stroke Neg Hx    Hyperlipidemia Neg Hx    Hypertension Neg Hx     ALLERGIES:  has No Known Allergies.  MEDICATIONS:  Current Outpatient Medications  Medication Sig Dispense Refill   acetaminophen (TYLENOL) 500 MG tablet Take 2 tablets (1,000 mg total) by mouth every 6 (six) hours as needed. 30 tablet 0   amLODipine (NORVASC) 5 MG tablet Take 1 tablet (5 mg total) by mouth daily. 30 tablet 3   ondansetron (ZOFRAN) 8 MG tablet Take 1 tablet (8 mg total) by mouth every 8 (eight) hours as needed for nausea or vomiting. 30 tablet 1   predniSONE (DELTASONE) 20 MG tablet Take 3 tablets (60 mg total) by mouth daily with breakfast. Take 3 tablets by mouth on days 1-5 of chemotherapy. Take with food 15 tablet 3   prochlorperazine (COMPAZINE) 10 MG tablet TAKE 1 TABLET(10 MG) BY MOUTH EVERY 6 HOURS AS NEEDED FOR NAUSEA OR VOMITING 90 tablet 0   No current facility-administered medications for this visit.   Facility-Administered Medications Ordered  in Other Visits  Medication Dose Route Frequency Provider Last Rate Last Admin   heparin lock flush 100 unit/mL  500 Units Intracatheter Once PRN Ledell Peoples IV, MD       sodium chloride flush (NS) 0.9 % injection 10 mL  10 mL Intracatheter PRN Orson Slick, MD        REVIEW OF SYSTEMS:   Constitutional: ( - ) fevers, ( - )  chills , ( - ) night sweats Eyes: ( - ) blurriness of vision, ( - ) double vision, ( - ) watery eyes Ears, nose, mouth, throat, and face: ( - ) mucositis, ( - ) sore throat Respiratory: ( - ) cough, ( - ) dyspnea, ( - ) wheezes Cardiovascular: ( - ) palpitation, ( - ) chest discomfort, ( - ) lower extremity swelling Gastrointestinal:  ( - )  nausea, ( - ) heartburn, ( - ) change in bowel habits Skin: ( - ) abnormal skin rashes Lymphatics: ( - ) new lymphadenopathy, ( - ) easy bruising Neurological: ( - ) numbness, ( - ) tingling, ( - ) new weaknesses Behavioral/Psych: ( - ) mood change, ( - ) new changes  All other systems were reviewed with the patient and are negative.  PHYSICAL EXAMINATION: ECOG PERFORMANCE STATUS: 1 - Symptomatic but completely ambulatory  Vitals:   08/17/22 1010  BP: (!) 154/96  Pulse: (!) 58  Resp: 16  Temp: 98 F (36.7 C)  SpO2: 99%    Filed Weights   08/17/22 1010  Weight: 237 lb 14.4 oz (107.9 kg)     GENERAL: well appearing middle aged Caucasian male in NAD  SKIN: skin color, texture, turgor are normal, no rashes or significant lesions EYES: conjunctiva are pink and non-injected, sclera clear LUNGS: clear to auscultation and percussion with normal breathing effort HEART: regular rate & rhythm and no murmurs and no lower extremity edema Musculoskeletal: no cyanosis of digits and no clubbing  PSYCH: alert & oriented x 3, fluent speech NEURO: no focal motor/sensory deficits  LABORATORY DATA:  I have reviewed the data as listed    Latest Ref Rng & Units 08/17/2022    9:09 AM 07/26/2022    8:41 AM 06/29/2022    8:36  AM  CBC  WBC 4.0 - 10.5 K/uL 5.4  5.0  5.4   Hemoglobin 13.0 - 17.0 g/dL 13.3  14.1  14.6   Hematocrit 39.0 - 52.0 % 37.1  40.4  40.8   Platelets 150 - 400 K/uL 266  245  181        Latest Ref Rng & Units 08/17/2022    9:09 AM 07/26/2022    8:41 AM 06/29/2022    8:36 AM  CMP  Glucose 70 - 99 mg/dL 130  106  101   BUN 8 - 23 mg/dL '18  15  13   '$ Creatinine 0.61 - 1.24 mg/dL 0.92  1.05  1.00   Sodium 135 - 145 mmol/L 140  138  140   Potassium 3.5 - 5.1 mmol/L 3.7  4.0  4.1   Chloride 98 - 111 mmol/L 107  104  106   CO2 22 - 32 mmol/L '28  24  29   '$ Calcium 8.9 - 10.3 mg/dL 9.2  9.1  9.6   Total Protein 6.5 - 8.1 g/dL 6.8  6.7  6.7   Total Bilirubin 0.3 - 1.2 mg/dL 0.7  0.7  0.8   Alkaline Phos 38 - 126 U/L 78  77  74   AST 15 - 41 U/L '13  15  12   '$ ALT 0 - 44 U/L '13  15  11    '$ RADIOGRAPHIC STUDIES: I have personally reviewed the radiological images as listed and agreed with the findings in the report: Marked response in the patient's lymphadenopathy and decrease in his FDG avidity.  Bone marrow involvement also appears considerably less.  Overall consistent with a partial response.  CT CHEST ABDOMEN PELVIS W CONTRAST  Result Date: 07/24/2022 CLINICAL DATA:  History of follicular lymphoma. Follow-up exam. * Tracking Code: BO * EXAM: CT CHEST, ABDOMEN, AND PELVIS WITH CONTRAST TECHNIQUE: Multidetector CT imaging of the chest, abdomen and pelvis was performed following the standard protocol during bolus administration of intravenous contrast. RADIATION DOSE REDUCTION: This exam was performed according to the departmental dose-optimization program which includes automated exposure control,  adjustment of the mA and/or kV according to patient size and/or use of iterative reconstruction technique. CONTRAST:  156m OMNIPAQUE IOHEXOL 300 MG/ML  SOLN COMPARISON:  CT C AP April 30, 2022 FINDINGS: CT CHEST FINDINGS Cardiovascular: Right anterior chest wall Port-A-Cath is present with tip terminating in  the superior vena cava. Normal heart size. Thoracic aortic vascular calcifications. Ascending thoracic aorta measures 4.0 cm, unchanged. Mediastinum/Nodes: Similar bilateral axillary lymphadenopathy. Reference left axillary lymph node measures 2.4 x 1.5 cm (image 13; series 2), previously 2.4 x 1.5 cm. Reference right axillary lymph node measures 1.5 x 1.9 cm (image 11; series 2), previously 1.9 x 1.3 cm. Normal appearance of the esophagus. Interval decrease in size of left axillary lymph node measuring 1.0 x 0.8 cm, previously 1.5 x 1.6 cm. Lungs/Pleura: The central airways are patent. Dependent atelectasis within the bilateral lower lobes. Stable 3 mm right lower lobe pulmonary nodule (image 89; series 4). No pleural effusion or pneumothorax. Musculoskeletal: Thoracic spine degenerative changes. No aggressive or acute appearing osseous lesions. CT ABDOMEN PELVIS FINDINGS Hepatobiliary: Liver is normal in size and contour. No focal lesion. Gallbladder is unremarkable. No intrahepatic or extrahepatic biliary ductal dilatation. Pancreas: Unremarkable Spleen: Unremarkable Adrenals/Urinary Tract: Unchanged 14 mm right adrenal nodule. Normal left adrenal gland. Grossly similar perinephric fat stranding as well as soft tissue about the superior poles of the kidneys bilaterally, left-greater-than-right measuring up to 20 mm overlying the left kidney and 10 mm overlying the right kidney. Urinary bladder is unremarkable. Stomach/Bowel: Normal appendix. Oral contrast material within the small bowel. No evidence for bowel obstruction. No free fluid or free intraperitoneal air. Vascular/Lymphatic: Normal caliber abdominal aorta. Persistent retroperitoneal soft tissue and fat stranding. Similar left para-aortic node measuring 5.3 x 3.8 cm (image 74; series 2), previously 5.2 x 3.8 cm. Similar left inguinal lymph node measuring 3.0 x 3.4 cm (image 126; series 2), previously 3.3 x 3.2 cm. Reproductive: Heterogeneous prostate.  Other: Small fat containing left inguinal hernia. Small fat containing ventral abdominal wall hernia. Musculoskeletal: Lumbar spine degenerative changes. No aggressive or acute appearing osseous lesions. IMPRESSION: Interval decrease in size of 1 left axillary lymph node. Otherwise, grossly similar adenopathy within the chest, abdomen and pelvis. Similar-appearing perirenal soft tissue bilaterally. Ascending thoracic aorta measures 4.0 cm. Recommend annual imaging followup by CTA or MRA. This recommendation follows 2010 ACCF/AHA/AATS/ACR/ASA/SCA/SCAI/SIR/STS/SVM Guidelines for the Diagnosis and Management of Patients with Thoracic Aortic Disease. Circulation. 2010; 121:: E315-Q008 Aortic aneurysm NOS (ICD10-I71.9) Electronically Signed   By: DLovey NewcomerM.D.   On: 07/24/2022 07:26    ASSESSMENT & PLAN MWOODRUFF SKIRVIN89y.o. male with medical history significant for diagnosed follicular lymphoma who presents for a follow up visit.   He completed 6 cycles of Bendamustine/Rituximab therapy from 04/30/2020-09/17/2020. Repeat PET CT scan on 10/26/2020 showed a partial response. OK to enter observation.   CT scan from 04/30/2022 and doppler [place] 05/05/2022 showed enlarging adenopathy. Recommended second line therapy with R-CHOP. Regimen consists of Rituximab 375 mg/m2, cytoxan 750 mg/m2, vincristine 2 mg IV, adriamycin 50 mg/m2 along with prednisone 60 mg PO on days 1-5. This regimen is given every 21 days.   # Follicular Lymphoma. Stage III --patient completed Cycle 6 of R-Benda therapy. This was administered with curative intent.  --PET CT scan after 6 cycles of R-Benda showed substantial partial response. Residual mild hypermetabolism within the retroperitoneal and left common iliac chains (Deauville score 3). No residual metabolically active adenopathy in the neck or chest --per LFlavia Shipper  Criteria, patient is a Partial Response.  --CT neck and CAP from 04/30/2022 showed progressive adenopathy.  --Doppler   US from 05/05/2022 showed progressive inguinal adenopathy, bilateral. --Recommend systemic therapy with R-CHOP started on 06/29/2022 ----Baseline ECHO from 06/10/2022 shows EF 60-65% Plan:  --Due to start Cycle 3 of R-CHOP today --Labs from today were reviewed and adequate for treatment.  -- labs today show white blood cell 5.4, hemoglobin 13.3, MCV 93.5, and platelets of 245 --Proceed with treatment today without any modifications --RTC in 3 weeks for labs and f/u visit   # Left Lower Leg Swelling, improved --Patient has no more redness and +1 pitting edema of left lower extremity --Doppler US from 05/05/2022 showed no evidence of DVT. There are enlarged lymph nodes noted in the groin, bilaterally which is likely causing the swelling.   #Supportive Care -- port placement done -- zofran '8mg'$  q8H PRN and compazine '10mg'$  PO q6H for nausea -- EMLA cream for port -- no pain medication required at this time.   No orders of the defined types were placed in this encounter.   All questions were answered. The patient knows to call the clinic with any problems, questions or concerns.  I have spent a total of 30 minutes minutes of face-to-face and non-face-to-face time, preparing to see the patient,  performing a medically appropriate examination, counseling and educating the patient, ordering medications, documenting clinical information in the electronic health record, and care coordination.   Ledell Peoples, MD Department of Hematology/Oncology [place] [facility] Phone: [phone] Pager: [phone] Email: [email or web address]  08/17/2022 2:25 PM

## 2022-08-18 ENCOUNTER — Other Ambulatory Visit: Payer: Self-pay | Admitting: Physician Assistant

## 2022-08-18 ENCOUNTER — Other Ambulatory Visit: Payer: Self-pay

## 2022-08-19 ENCOUNTER — Other Ambulatory Visit: Payer: Self-pay

## 2022-08-19 ENCOUNTER — Inpatient Hospital Stay: Payer: Medicare Other

## 2022-08-19 VITALS — BP 164/82 | HR 57 | Temp 98.0°F | Resp 18

## 2022-08-19 DIAGNOSIS — C8233 Follicular lymphoma grade IIIa, intra-abdominal lymph nodes: Secondary | ICD-10-CM

## 2022-08-19 DIAGNOSIS — Z5112 Encounter for antineoplastic immunotherapy: Secondary | ICD-10-CM | POA: Diagnosis not present

## 2022-08-19 MED ORDER — PEGFILGRASTIM-CBQV 6 MG/0.6ML ~~LOC~~ SOSY
6.0000 mg | PREFILLED_SYRINGE | Freq: Once | SUBCUTANEOUS | Status: AC
  Administered 2022-08-19: 6 mg via SUBCUTANEOUS
  Filled 2022-08-19: qty 0.6

## 2022-09-06 MED FILL — Dexamethasone Sodium Phosphate Inj 100 MG/10ML: INTRAMUSCULAR | Qty: 1 | Status: AC

## 2022-09-06 MED FILL — Fosaprepitant Dimeglumine For IV Infusion 150 MG (Base Eq): INTRAVENOUS | Qty: 5 | Status: AC

## 2022-09-07 ENCOUNTER — Other Ambulatory Visit: Payer: Self-pay

## 2022-09-07 ENCOUNTER — Inpatient Hospital Stay: Payer: Medicare Other

## 2022-09-07 ENCOUNTER — Inpatient Hospital Stay (HOSPITAL_BASED_OUTPATIENT_CLINIC_OR_DEPARTMENT_OTHER): Payer: Medicare Other | Admitting: Hematology and Oncology

## 2022-09-07 VITALS — BP 120/80 | HR 57 | Temp 97.7°F | Resp 16

## 2022-09-07 DIAGNOSIS — C8233 Follicular lymphoma grade IIIa, intra-abdominal lymph nodes: Secondary | ICD-10-CM

## 2022-09-07 DIAGNOSIS — Z5112 Encounter for antineoplastic immunotherapy: Secondary | ICD-10-CM | POA: Diagnosis not present

## 2022-09-07 DIAGNOSIS — Z95828 Presence of other vascular implants and grafts: Secondary | ICD-10-CM

## 2022-09-07 LAB — CMP (CANCER CENTER ONLY)
ALT: 15 U/L (ref 0–44)
AST: 13 U/L — ABNORMAL LOW (ref 15–41)
Albumin: 4.1 g/dL (ref 3.5–5.0)
Alkaline Phosphatase: 75 U/L (ref 38–126)
Anion gap: 7 (ref 5–15)
BUN: 14 mg/dL (ref 8–23)
CO2: 28 mmol/L (ref 22–32)
Calcium: 9.1 mg/dL (ref 8.9–10.3)
Chloride: 106 mmol/L (ref 98–111)
Creatinine: 0.99 mg/dL (ref 0.61–1.24)
GFR, Estimated: >60 mL/min (ref >60)
Glucose, Bld: 119 mg/dL — ABNORMAL HIGH (ref 70–99)
Potassium: 4 mmol/L (ref 3.5–5.1)
Sodium: 141 mmol/L (ref 135–145)
Total Bilirubin: 0.6 mg/dL (ref 0.3–1.2)
Total Protein: 6.7 g/dL (ref 6.5–8.1)

## 2022-09-07 LAB — CBC WITH DIFFERENTIAL (CANCER CENTER ONLY)
Abs Immature Granulocytes: 0.05 10*3/uL (ref 0.00–0.07)
Basophils Absolute: 0 10*3/uL (ref 0.0–0.1)
Basophils Relative: 1 %
Eosinophils Absolute: 0 10*3/uL (ref 0.0–0.5)
Eosinophils Relative: 1 %
HCT: 35.6 % — ABNORMAL LOW (ref 39.0–52.0)
Hemoglobin: 12.5 g/dL — ABNORMAL LOW (ref 13.0–17.0)
Immature Granulocytes: 1 %
Lymphocytes Relative: 7 %
Lymphs Abs: 0.3 10*3/uL — ABNORMAL LOW (ref 0.7–4.0)
MCH: 33.8 pg (ref 26.0–34.0)
MCHC: 35.1 g/dL (ref 30.0–36.0)
MCV: 96.2 fL (ref 80.0–100.0)
Monocytes Absolute: 0.6 10*3/uL (ref 0.1–1.0)
Monocytes Relative: 13 %
Neutro Abs: 3.6 10*3/uL (ref 1.7–7.7)
Neutrophils Relative %: 77 %
Platelet Count: 292 10*3/uL (ref 150–400)
RBC: 3.7 MIL/uL — ABNORMAL LOW (ref 4.22–5.81)
RDW: 14.7 % (ref 11.5–15.5)
WBC Count: 4.6 10*3/uL (ref 4.0–10.5)
nRBC: 0 % (ref 0.0–0.2)

## 2022-09-07 MED ORDER — DOXORUBICIN HCL CHEMO IV INJECTION 2 MG/ML
50.0000 mg/m2 | Freq: Once | INTRAVENOUS | Status: AC
  Administered 2022-09-07: 118 mg via INTRAVENOUS
  Filled 2022-09-07: qty 59

## 2022-09-07 MED ORDER — VINCRISTINE SULFATE CHEMO INJECTION 1 MG/ML
2.0000 mg | Freq: Once | INTRAVENOUS | Status: AC
  Administered 2022-09-07: 2 mg via INTRAVENOUS
  Filled 2022-09-07: qty 2

## 2022-09-07 MED ORDER — SODIUM CHLORIDE 0.9% FLUSH
10.0000 mL | Freq: Once | INTRAVENOUS | Status: AC
  Administered 2022-09-07: 10 mL

## 2022-09-07 MED ORDER — SODIUM CHLORIDE 0.9 % IV SOLN
150.0000 mg | Freq: Once | INTRAVENOUS | Status: AC
  Administered 2022-09-07: 150 mg via INTRAVENOUS
  Filled 2022-09-07: qty 150

## 2022-09-07 MED ORDER — ACETAMINOPHEN 325 MG PO TABS
650.0000 mg | ORAL_TABLET | Freq: Once | ORAL | Status: AC
  Administered 2022-09-07: 650 mg via ORAL
  Filled 2022-09-07: qty 2

## 2022-09-07 MED ORDER — SODIUM CHLORIDE 0.9 % IV SOLN
375.0000 mg/m2 | Freq: Once | INTRAVENOUS | Status: AC
  Administered 2022-09-07: 900 mg via INTRAVENOUS
  Filled 2022-09-07: qty 50

## 2022-09-07 MED ORDER — SODIUM CHLORIDE 0.9% FLUSH
10.0000 mL | INTRAVENOUS | Status: DC | PRN
  Administered 2022-09-07: 10 mL

## 2022-09-07 MED ORDER — SODIUM CHLORIDE 0.9 % IV SOLN
10.0000 mg | Freq: Once | INTRAVENOUS | Status: AC
  Administered 2022-09-07: 10 mg via INTRAVENOUS
  Filled 2022-09-07: qty 10

## 2022-09-07 MED ORDER — SODIUM CHLORIDE 0.9 % IV SOLN: Freq: Once | INTRAVENOUS | Status: AC

## 2022-09-07 MED ORDER — PALONOSETRON HCL INJECTION 0.25 MG/5ML
0.2500 mg | Freq: Once | INTRAVENOUS | Status: AC
  Administered 2022-09-07: 0.25 mg via INTRAVENOUS
  Filled 2022-09-07: qty 5

## 2022-09-07 MED ORDER — HEPARIN SOD (PORK) LOCK FLUSH 100 UNIT/ML IV SOLN
500.0000 [IU] | Freq: Once | INTRAVENOUS | Status: AC | PRN
  Administered 2022-09-07: 500 [IU]

## 2022-09-07 MED ORDER — CETIRIZINE HCL 10 MG/ML IV SOLN
10.0000 mg | Freq: Once | INTRAVENOUS | Status: AC
  Administered 2022-09-07: 10 mg via INTRAVENOUS
  Filled 2022-09-07: qty 1

## 2022-09-07 MED ORDER — SODIUM CHLORIDE 0.9 % IV SOLN
750.0000 mg/m2 | Freq: Once | INTRAVENOUS | Status: AC
  Administered 2022-09-07: 1780 mg via INTRAVENOUS
  Filled 2022-09-07: qty 89

## 2022-09-07 NOTE — Patient Instructions (Signed)
[facility] ONCOLOG[facility]  Discharge Instructions: Thank you for choosing [facility] to provide your oncology and hematology care.   If you have a lab appointment with the [facility], please go directly to the [facility] and check in at the registration area.   Wear comfortable clothing and clothing appropriate for easy access to any Portacath or PICC line.   We strive to give you quality time with your provider. You may need to reschedule your appointment if you arrive late (15 or more minutes).  Arriving late affects you and other patients whose appointments are after yours.  Also, if you miss three or more appointments without notifying the office, you may be dismissed from the clinic at the provider's discretion.      For prescription refill requests, have your pharmacy contact our office and allow 72 hours for refills to be completed.    Today you received the following chemotherapy and/or immunotherapy agents: Adriamycin/Vincristine/Cytoxan/Ruxience      To help prevent nausea and vomiting after your treatment, we encourage you to take your nausea medication as directed.  BELOW ARE SYMPTOMS THAT SHOULD BE REPORTED IMMEDIATELY: *FEVER GREATER THAN 100.4 F (38 C) OR HIGHER *CHILLS OR SWEATING *NAUSEA AND VOMITING THAT IS NOT CONTROLLED WITH YOUR NAUSEA MEDICATION *UNUSUAL SHORTNESS OF BREATH *UNUSUAL BRUISING OR BLEEDING *URINARY PROBLEMS (pain or burning when urinating, or frequent urination) *BOWEL PROBLEMS (unusual diarrhea, constipation, pain near the anus) TENDERNESS IN MOUTH AND THROAT WITH OR WITHOUT PRESENCE OF ULCERS (sore throat, sores in mouth, or a toothache) UNUSUAL RASH, SWELLING OR PAIN  UNUSUAL VAGINAL DISCHARGE OR ITCHING   Items with * indicate a potential emergency and should be followed up as soon as possible or go to the Emergency Department if any problems should occur.  Please show the CHEMOTHERAPY ALERT CARD or  IMMUNOTHERAPY ALERT CARD at check-in to the Emergency Department and triage nurse.  Should you have questions after your visit or need to cancel or reschedule your appointment, please contact [facility]  Dept: [phone]  and follow the prompts.  Office hours are 8:00 a.m. to 4:30 p.m. Monday - Friday. Please note that voicemails left after 4:00 p.m. may not be returned until the following business day.  We are closed weekends and major holidays. You have access to a nurse at all times for urgent questions. Please call the main number to the clinic Dept: [phone] and follow the prompts.   For any non-urgent questions, you may also contact your provider using MyChart. We now offer e-Visits for anyone 62 and older to request care online for non-urgent symptoms. For details visit [email or web address].   Also download the MyChart app! Go to the app store, search "MyChart", open the app, select [facility], and log in with your MyChart username and password.  Masks are optional in the cancer centers. If you would like for your care team to wear a mask while they are taking care of you, please let them know. You may have one support person who is at least 68 years old accompany you for your appointments.

## 2022-09-07 NOTE — Progress Notes (Signed)
[place] [phone]   [phone]  PROGRESS NOTE  Patient Care Team: Orson Slick, MD as PCP - General (Hematology and Oncology)  Hematological/Oncological History # Follicular Lymphoma. Stage III 1) 01/07/2020: CT Neck W contrast showed cervical lymphadenopathy bilaterally, left greater than right. Numerous enlarged lymph nodes are present 2)  01/22/2020: [place] Core biopsy performed which revealed overall features consistent with involvement by non-Hodgkin's B-cell lymphoma and the morphologic and phenotypic findings favor follicular lymphoma.  3) 01/31/2020: establish care with Dr. Lorenso Courier  4) 02/12/2020: PET CT scan demonstrates bulky adenopathy in the neck, chest, abdomen and pelvis and diffuse skeletal uptake is suspicious for (but not diagnostic) of marrow involvement. 5) 04/30/2020: Cycle 1 Day 1 of Bendamustine/Rituximab 6) 05/29/2020: Cycle 2 Day 1 of Bendamustine/Rituximab 7) 06/25/2020: Cycle 3 Day 1 of Bendamustine/Rituximab 8) 07/23/2020: Cycle 4 Day 1 of Bendamustine/Rituximab 9) 08/25/2020: Cycle 5 Day 1 of Bendamustine/Rituximab 10)09/17/2020: Cycle 6 Day 1 of Bendamustine/Rituximab 11) 10/26/2020: PET CT scan shows partial response (Deauville Score 3 in lymph nodes but some residual FDG avidity in the bone marrow).  12) 04/27/2021: CT scan showed interval decreased adenopathy below the diaphragm, consistent with treatment response. No new or enlarging suspicious lymph nodes 13) 10/27/2021: CT scan showed increased left inguinal adenopathy, including a 1.6 cm left inguinal lymph node which was previously 0.9 cm in short axis. 14) 04/30/2022: CT scan shows progressive adenopathy below the diaphragm with new thoracic adenopathy and increased hazy retroperitoneal stranding, consistent with worsening lymphomatous disease. 15) 05/05/2022: Doppler US of lower extremities revealed bilateral lymph nodes in the groin.   16) 06/10/2022: Echo: EF 60-65% 17)  06/29/2022: Cycle 1 of R-CHOP 18) 07/26/2022: Cycle 2 of R-CHOP 19) 08/17/2022: Cycle 3 of R-CHOP 20) 09/07/2022: Cycle 4 of R-CHOP  Interval History:  Jorge Gould 66 y.o. male with medical history significant for Stage III follicular lymphoma who presents for a follow up visit. The patient's last visit was on 08/17/2022. In the interim since the last visit, he completed Cycle 3 of R-CHOP.  On exam today Jorge Gould reports he tolerated his last chemotherapy cycle well without any nausea, vomiting, or diarrhea.  He reports that he is "a little tired".  Though he is "pushing the limit".  He is having some issues with watering of his eyes but his appetite is strong.  He is lost about 5 pounds of weight since 07/26/2022 reports that he is actually delighted by this.  He notices that the swelling in his left leg is markedly improved and he is not noticing lymphadenopathy elsewhere in his body at this time.  He reports that he otherwise has not had any major changes in his health.  He has no other complaints.  Rest of 10 point ROS is below.  Overall he is willing and able to proceed with treatment today.   MEDICAL HISTORY:  Past Medical History:  Diagnosis Date   Cancer ([place]) 7893   follicular lymphoma   Obesity     SURGICAL HISTORY: Past Surgical History:  Procedure Laterality Date   ANKLE FRACTURE SURGERY  2016   right, trauma repair after fall down stairs   HERNIA REPAIR     umbilical   IR IMAGING GUIDED PORT INSERTION  04/09/2020   IR IMAGING GUIDED PORT INSERTION  06/17/2022   IR REMOVAL TUN ACCESS W/ PORT W/O FL MOD SED  11/27/2020   TONSILLECTOMY      SOCIAL HISTORY: Social History  Socioeconomic History   Marital status: Married    Spouse name: Not on file   Number of children: Not on file   Years of education: Not on file   Highest education level: Not on file  Occupational History   Not on file  Tobacco Use   Smoking status: Never   Smokeless tobacco: Never  Vaping Use    Vaping Use: Never used  Substance and Sexual Activity   Alcohol use: Yes    Alcohol/week: 3.0 standard drinks of alcohol    Types: 3 Shots of liquor per week   Drug use: Not Currently   Sexual activity: Not on file  Other Topics Concern   Not on file  Social History Narrative   Engaged, plan to marry in March 2021, been together since 2014.  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   Most exercise on the job, working 7 days per week.   No children, but fiance has 5 children.    11/2019   Social Determinants of Health   Financial Resource Strain: Not on file  Food Insecurity: Not on file  Transportation Needs: Not on file  Physical Activity: Not on file  Stress: Not on file  Social Connections: Not on file  Intimate Partner Violence: Not on file    FAMILY HISTORY: Family History  Problem Relation Age of Onset   Cancer Mother        lung; former smoker   COPD Father        emphysema   Diabetes Maternal Aunt    Heart disease Maternal Uncle    Heart disease Maternal Uncle    Diabetes Maternal Aunt    Stroke Neg Hx    Hyperlipidemia Neg Hx    Hypertension Neg Hx     ALLERGIES:  has No Known Allergies.  MEDICATIONS:  Current Outpatient Medications  Medication Sig Dispense Refill   acetaminophen (TYLENOL) 500 MG tablet Take 2 tablets (1,000 mg total) by mouth every 6 (six) hours as needed. 30 tablet 0   amLODipine (NORVASC) 5 MG tablet Take 1 tablet (5 mg total) by mouth daily. 30 tablet 3   ondansetron (ZOFRAN) 8 MG tablet Take 1 tablet (8 mg total) by mouth every 8 (eight) hours as needed for nausea or vomiting. 30 tablet 1   predniSONE (DELTASONE) 20 MG tablet Take 3 tablets (60 mg total) by mouth daily with breakfast. Take 3 tablets by mouth on days 1-5 of chemotherapy. Take with food 15 tablet 3   prochlorperazine (COMPAZINE) 10 MG tablet TAKE 1 TABLET(10 MG) BY MOUTH EVERY 6 HOURS AS NEEDED FOR NAUSEA OR VOMITING 90 tablet 0   No current  facility-administered medications for this visit.    REVIEW OF SYSTEMS:   Constitutional: ( - ) fevers, ( - )  chills , ( - ) night sweats Eyes: ( - ) blurriness of vision, ( - ) double vision, ( - ) watery eyes Ears, nose, mouth, throat, and face: ( - ) mucositis, ( - ) sore throat Respiratory: ( - ) cough, ( - ) dyspnea, ( - ) wheezes Cardiovascular: ( - ) palpitation, ( - ) chest discomfort, ( - ) lower extremity swelling Gastrointestinal:  ( - ) nausea, ( - ) heartburn, ( - ) change in bowel habits Skin: ( - ) abnormal skin rashes Lymphatics: ( - ) new lymphadenopathy, ( - ) easy bruising Neurological: ( - ) numbness, ( - ) tingling, ( - ) new  weaknesses Behavioral/Psych: ( - ) mood change, ( - ) new changes  All other systems were reviewed with the patient and are negative.  PHYSICAL EXAMINATION: ECOG PERFORMANCE STATUS: 1 - Symptomatic but completely ambulatory  Vitals:   09/07/22 0911  BP: (!) 149/90  Pulse: (!) 53  Resp: 17  Temp: 97.7 F (36.5 C)  SpO2: 100%    Filed Weights   09/07/22 0911  Weight: 236 lb 11.2 oz (107.4 kg)     GENERAL: well appearing middle aged Caucasian male in NAD  SKIN: skin color, texture, turgor are normal, no rashes or significant lesions EYES: conjunctiva are pink and non-injected, sclera clear LUNGS: clear to auscultation and percussion with normal breathing effort HEART: regular rate & rhythm and no murmurs and no lower extremity edema Musculoskeletal: no cyanosis of digits and no clubbing  PSYCH: alert & oriented x 3, fluent speech NEURO: no focal motor/sensory deficits  LABORATORY DATA:  I have reviewed the data as listed    Latest Ref Rng & Units 09/07/2022    8:45 AM 08/17/2022    9:09 AM 07/26/2022    8:41 AM  CBC  WBC 4.0 - 10.5 K/uL 4.6  5.4  5.0   Hemoglobin 13.0 - 17.0 g/dL 12.5  13.3  14.1   Hematocrit 39.0 - 52.0 % 35.6  37.1  40.4   Platelets 150 - 400 K/uL 292  266  245        Latest Ref Rng & Units  08/17/2022    9:09 AM 07/26/2022    8:41 AM 06/29/2022    8:36 AM  CMP  Glucose 70 - 99 mg/dL 130  106  101   BUN 8 - 23 mg/dL '18  15  13   '$ Creatinine 0.61 - 1.24 mg/dL 0.92  1.05  1.00   Sodium 135 - 145 mmol/L 140  138  140   Potassium 3.5 - 5.1 mmol/L 3.7  4.0  4.1   Chloride 98 - 111 mmol/L 107  104  106   CO2 22 - 32 mmol/L '28  24  29   '$ Calcium 8.9 - 10.3 mg/dL 9.2  9.1  9.6   Total Protein 6.5 - 8.1 g/dL 6.8  6.7  6.7   Total Bilirubin 0.3 - 1.2 mg/dL 0.7  0.7  0.8   Alkaline Phos 38 - 126 U/L 78  77  74   AST 15 - 41 U/L '13  15  12   '$ ALT 0 - 44 U/L '13  15  11    '$ RADIOGRAPHIC STUDIES: I have personally reviewed the radiological images as listed and agreed with the findings in the report: Marked response in the patient's lymphadenopathy and decrease in his FDG avidity.  Bone marrow involvement also appears considerably less.  Overall consistent with a partial response.  No results found.  ASSESSMENT & PLAN Jorge Gould 66 y.o. male with medical history significant for diagnosed follicular lymphoma who presents for a follow up visit.   He completed 6 cycles of Bendamustine/Rituximab therapy from 04/30/2020-09/17/2020. Repeat PET CT scan on 10/26/2020 showed a partial response. OK to enter observation.   CT scan from 04/30/2022 and doppler US from 05/05/2022 showed enlarging adenopathy. Recommended second line therapy with R-CHOP. Regimen consists of Rituximab 375 mg/m2, cytoxan 750 mg/m2, vincristine 2 mg IV, adriamycin 50 mg/m2 along with prednisone 60 mg PO on days 1-5. This regimen is given every 21 days.   # Follicular Lymphoma. Stage III --patient completed Cycle  6 of R-Benda therapy. This was administered with curative intent.  --PET CT scan after 6 cycles of R-Benda showed substantial partial response. Residual mild hypermetabolism within the retroperitoneal and left common iliac chains (Deauville score 3). No residual metabolically active adenopathy in the neck or  chest --per [place] Criteria, patient is a Partial Response.  --CT neck and CAP from 04/30/2022 showed progressive adenopathy.  --Doppler  US from 05/05/2022 showed progressive inguinal adenopathy, bilateral. --Recommend systemic therapy with R-CHOP started on 06/29/2022 ----Baseline ECHO from 06/10/2022 shows EF 60-65% Plan:  --Due to start Cycle 4 of R-CHOP today --Labs from today were reviewed and adequate for treatment.  -- labs today show white blood cell 4.6, hemoglobin 12.5, MCV 96.2, and platelets of 292 --Proceed with treatment today without any modifications --RTC in 3 weeks for labs and f/u visit   # Left Lower Leg Swelling, improved --Patient has no more redness and +1 pitting edema of left lower extremity --Doppler US from 05/05/2022 showed no evidence of DVT. There are enlarged lymph nodes noted in the groin, bilaterally which is likely causing the swelling.   #Supportive Care -- port placement done -- zofran '8mg'$  q8H PRN and compazine '10mg'$  PO q6H for nausea -- EMLA cream for port -- no pain medication required at this time.   No orders of the defined types were placed in this encounter.   All questions were answered. The patient knows to call the clinic with any problems, questions or concerns.  I have spent a total of 30 minutes minutes of face-to-face and non-face-to-face time, preparing to see the patient,  performing a medically appropriate examination, counseling and educating the patient, ordering medications, documenting clinical information in the electronic health record, and care coordination.   Ledell Peoples, MD Department of Hematology/Oncology [place] [facility] Phone: [phone] Pager: [phone] Email: [email or web address]  09/07/2022 9:29 AM

## 2022-09-09 ENCOUNTER — Telehealth: Payer: Self-pay | Admitting: Hematology and Oncology

## 2022-09-09 ENCOUNTER — Inpatient Hospital Stay: Payer: Medicare Other | Attending: Hematology and Oncology

## 2022-09-09 DIAGNOSIS — Z79899 Other long term (current) drug therapy: Secondary | ICD-10-CM | POA: Insufficient documentation

## 2022-09-09 DIAGNOSIS — Z5112 Encounter for antineoplastic immunotherapy: Secondary | ICD-10-CM | POA: Insufficient documentation

## 2022-09-09 DIAGNOSIS — C8220 Follicular lymphoma grade III, unspecified, unspecified site: Secondary | ICD-10-CM | POA: Insufficient documentation

## 2022-09-09 DIAGNOSIS — Z5111 Encounter for antineoplastic chemotherapy: Secondary | ICD-10-CM | POA: Insufficient documentation

## 2022-09-09 NOTE — Telephone Encounter (Signed)
Per 11/2 in basket, message has been left

## 2022-09-10 ENCOUNTER — Inpatient Hospital Stay: Payer: Medicare Other

## 2022-09-10 ENCOUNTER — Other Ambulatory Visit: Payer: Self-pay

## 2022-09-10 VITALS — BP 150/95 | HR 58 | Temp 98.3°F | Resp 16

## 2022-09-10 DIAGNOSIS — C8233 Follicular lymphoma grade IIIa, intra-abdominal lymph nodes: Secondary | ICD-10-CM

## 2022-09-10 DIAGNOSIS — C8220 Follicular lymphoma grade III, unspecified, unspecified site: Secondary | ICD-10-CM | POA: Diagnosis present

## 2022-09-10 DIAGNOSIS — Z79899 Other long term (current) drug therapy: Secondary | ICD-10-CM | POA: Diagnosis not present

## 2022-09-10 DIAGNOSIS — Z5112 Encounter for antineoplastic immunotherapy: Secondary | ICD-10-CM | POA: Diagnosis not present

## 2022-09-10 DIAGNOSIS — Z5111 Encounter for antineoplastic chemotherapy: Secondary | ICD-10-CM | POA: Diagnosis present

## 2022-09-10 MED ORDER — PEGFILGRASTIM-CBQV 6 MG/0.6ML ~~LOC~~ SOSY
6.0000 mg | PREFILLED_SYRINGE | Freq: Once | SUBCUTANEOUS | Status: AC
  Administered 2022-09-10: 6 mg via SUBCUTANEOUS
  Filled 2022-09-10: qty 0.6

## 2022-09-28 MED FILL — Dexamethasone Sodium Phosphate Inj 100 MG/10ML: INTRAMUSCULAR | Qty: 1 | Status: AC

## 2022-09-28 MED FILL — Fosaprepitant Dimeglumine For IV Infusion 150 MG (Base Eq): INTRAVENOUS | Qty: 5 | Status: AC

## 2022-09-28 NOTE — Progress Notes (Signed)
Rescheduled

## 2022-09-29 ENCOUNTER — Ambulatory Visit: Payer: Medicare Other

## 2022-09-29 ENCOUNTER — Other Ambulatory Visit: Payer: Medicare Other

## 2022-09-29 ENCOUNTER — Ambulatory Visit: Payer: Medicare Other | Admitting: Hematology and Oncology

## 2022-09-29 DIAGNOSIS — R591 Generalized enlarged lymph nodes: Secondary | ICD-10-CM

## 2022-09-29 DIAGNOSIS — C8233 Follicular lymphoma grade IIIa, intra-abdominal lymph nodes: Secondary | ICD-10-CM

## 2022-09-29 DIAGNOSIS — Z95828 Presence of other vascular implants and grafts: Secondary | ICD-10-CM

## 2022-09-29 DIAGNOSIS — M7989 Other specified soft tissue disorders: Secondary | ICD-10-CM

## 2022-09-30 ENCOUNTER — Other Ambulatory Visit: Payer: Self-pay

## 2022-10-01 ENCOUNTER — Inpatient Hospital Stay: Payer: Medicare Other

## 2022-10-06 ENCOUNTER — Inpatient Hospital Stay (HOSPITAL_BASED_OUTPATIENT_CLINIC_OR_DEPARTMENT_OTHER): Payer: Medicare Other | Admitting: Hematology and Oncology

## 2022-10-06 ENCOUNTER — Other Ambulatory Visit: Payer: Self-pay

## 2022-10-06 ENCOUNTER — Other Ambulatory Visit: Payer: Self-pay | Admitting: *Deleted

## 2022-10-06 ENCOUNTER — Inpatient Hospital Stay: Payer: Medicare Other

## 2022-10-06 VITALS — BP 157/94 | HR 60 | Temp 98.0°F | Resp 16 | Wt 238.5 lb

## 2022-10-06 VITALS — BP 159/85 | HR 64 | Temp 97.8°F | Resp 18

## 2022-10-06 DIAGNOSIS — C8233 Follicular lymphoma grade IIIa, intra-abdominal lymph nodes: Secondary | ICD-10-CM

## 2022-10-06 DIAGNOSIS — Z5112 Encounter for antineoplastic immunotherapy: Secondary | ICD-10-CM | POA: Diagnosis not present

## 2022-10-06 DIAGNOSIS — Z95828 Presence of other vascular implants and grafts: Secondary | ICD-10-CM

## 2022-10-06 LAB — CMP (CANCER CENTER ONLY)
ALT: 28 U/L (ref 0–44)
AST: 23 U/L (ref 15–41)
Albumin: 4.5 g/dL (ref 3.5–5.0)
Alkaline Phosphatase: 86 U/L (ref 38–126)
Anion gap: 8 (ref 5–15)
BUN: 15 mg/dL (ref 8–23)
CO2: 27 mmol/L (ref 22–32)
Calcium: 9.5 mg/dL (ref 8.9–10.3)
Chloride: 105 mmol/L (ref 98–111)
Creatinine: 0.98 mg/dL (ref 0.61–1.24)
GFR, Estimated: >60 mL/min (ref >60)
Glucose, Bld: 124 mg/dL — ABNORMAL HIGH (ref 70–99)
Potassium: 3.8 mmol/L (ref 3.5–5.1)
Sodium: 140 mmol/L (ref 135–145)
Total Bilirubin: 0.7 mg/dL (ref 0.3–1.2)
Total Protein: 7 g/dL (ref 6.5–8.1)

## 2022-10-06 LAB — CBC WITH DIFFERENTIAL (CANCER CENTER ONLY)
Abs Immature Granulocytes: 0.05 10*3/uL (ref 0.00–0.07)
Basophils Absolute: 0 10*3/uL (ref 0.0–0.1)
Basophils Relative: 1 %
Eosinophils Absolute: 0.2 10*3/uL (ref 0.0–0.5)
Eosinophils Relative: 4 %
HCT: 36.5 % — ABNORMAL LOW (ref 39.0–52.0)
Hemoglobin: 12.8 g/dL — ABNORMAL LOW (ref 13.0–17.0)
Immature Granulocytes: 1 %
Lymphocytes Relative: 9 %
Lymphs Abs: 0.5 10*3/uL — ABNORMAL LOW (ref 0.7–4.0)
MCH: 35.1 pg — ABNORMAL HIGH (ref 26.0–34.0)
MCHC: 35.1 g/dL (ref 30.0–36.0)
MCV: 100 fL (ref 80.0–100.0)
Monocytes Absolute: 0.5 10*3/uL (ref 0.1–1.0)
Monocytes Relative: 10 %
Neutro Abs: 4 10*3/uL (ref 1.7–7.7)
Neutrophils Relative %: 75 %
Platelet Count: 162 10*3/uL (ref 150–400)
RBC: 3.65 MIL/uL — ABNORMAL LOW (ref 4.22–5.81)
RDW: 14.6 % (ref 11.5–15.5)
WBC Count: 5.2 10*3/uL (ref 4.0–10.5)
nRBC: 0 % (ref 0.0–0.2)

## 2022-10-06 MED ORDER — SODIUM CHLORIDE 0.9% FLUSH
10.0000 mL | INTRAVENOUS | Status: DC | PRN
  Administered 2022-10-06: 10 mL

## 2022-10-06 MED ORDER — SODIUM CHLORIDE 0.9 % IV SOLN
10.0000 mg | Freq: Once | INTRAVENOUS | Status: AC
  Administered 2022-10-06: 10 mg via INTRAVENOUS
  Filled 2022-10-06: qty 10

## 2022-10-06 MED ORDER — ACETAMINOPHEN 325 MG PO TABS
650.0000 mg | ORAL_TABLET | Freq: Once | ORAL | Status: AC
  Administered 2022-10-06: 650 mg via ORAL
  Filled 2022-10-06: qty 2

## 2022-10-06 MED ORDER — PALONOSETRON HCL INJECTION 0.25 MG/5ML
0.2500 mg | Freq: Once | INTRAVENOUS | Status: AC
  Administered 2022-10-06: 0.25 mg via INTRAVENOUS
  Filled 2022-10-06: qty 5

## 2022-10-06 MED ORDER — SODIUM CHLORIDE 0.9 % IV SOLN
375.0000 mg/m2 | Freq: Once | INTRAVENOUS | Status: AC
  Administered 2022-10-06: 900 mg via INTRAVENOUS
  Filled 2022-10-06: qty 40

## 2022-10-06 MED ORDER — DOXORUBICIN HCL CHEMO IV INJECTION 2 MG/ML
50.0000 mg/m2 | Freq: Once | INTRAVENOUS | Status: AC
  Administered 2022-10-06: 118 mg via INTRAVENOUS
  Filled 2022-10-06: qty 59

## 2022-10-06 MED ORDER — SODIUM CHLORIDE 0.9 % IV SOLN: Freq: Once | INTRAVENOUS | Status: AC

## 2022-10-06 MED ORDER — PREDNISONE 20 MG PO TABS
60.0000 mg | ORAL_TABLET | Freq: Once | ORAL | Status: AC
  Administered 2022-10-06: 60 mg via ORAL
  Filled 2022-10-06: qty 3

## 2022-10-06 MED ORDER — VINCRISTINE SULFATE CHEMO INJECTION 1 MG/ML
2.0000 mg | Freq: Once | INTRAVENOUS | Status: AC
  Administered 2022-10-06: 2 mg via INTRAVENOUS
  Filled 2022-10-06: qty 2

## 2022-10-06 MED ORDER — SODIUM CHLORIDE 0.9 % IV SOLN
750.0000 mg/m2 | Freq: Once | INTRAVENOUS | Status: AC
  Administered 2022-10-06: 1780 mg via INTRAVENOUS
  Filled 2022-10-06: qty 89

## 2022-10-06 MED ORDER — CETIRIZINE HCL 10 MG/ML IV SOLN
10.0000 mg | Freq: Once | INTRAVENOUS | Status: AC
  Administered 2022-10-06: 10 mg via INTRAVENOUS
  Filled 2022-10-06: qty 1

## 2022-10-06 MED ORDER — SODIUM CHLORIDE 0.9% FLUSH
10.0000 mL | Freq: Once | INTRAVENOUS | Status: AC
  Administered 2022-10-06: 10 mL

## 2022-10-06 MED ORDER — SODIUM CHLORIDE 0.9 % IV SOLN
150.0000 mg | Freq: Once | INTRAVENOUS | Status: AC
  Administered 2022-10-06: 150 mg via INTRAVENOUS
  Filled 2022-10-06: qty 150

## 2022-10-06 MED ORDER — HEPARIN SOD (PORK) LOCK FLUSH 100 UNIT/ML IV SOLN
500.0000 [IU] | Freq: Once | INTRAVENOUS | Status: AC | PRN
  Administered 2022-10-06: 500 [IU]

## 2022-10-06 MED ORDER — HEPARIN SOD (PORK) LOCK FLUSH 100 UNIT/ML IV SOLN
500.0000 [IU] | Freq: Once | INTRAVENOUS | Status: AC
  Administered 2022-10-06: 500 [IU]

## 2022-10-06 MED FILL — Fosaprepitant Dimeglumine For IV Infusion 150 MG (Base Eq): INTRAVENOUS | Qty: 5 | Status: AC

## 2022-10-06 MED FILL — Dexamethasone Sodium Phosphate Inj 100 MG/10ML: INTRAMUSCULAR | Qty: 1 | Status: AC

## 2022-10-06 NOTE — Patient Instructions (Signed)
[facility]  Discharge Instructions: Thank you for choosing [facility] to provide your oncology and hematology care.   If you have a lab appointment with the [facility], please go directly to the [facility] and check in at the registration area.   Wear comfortable clothing and clothing appropriate for easy access to any Portacath or PICC line.   We strive to give you quality time with your provider. You may need to reschedule your appointment if you arrive late (15 or more minutes).  Arriving late affects you and other patients whose appointments are after yours.  Also, if you miss three or more appointments without notifying the office, you may be dismissed from the clinic at the provider's discretion.      For prescription refill requests, have your pharmacy contact our office and allow 72 hours for refills to be completed.    Today you received the following chemotherapy and/or immunotherapy agents: Doxorubicin, Vincristine, Cytoxan, Rituxan.       To help prevent nausea and vomiting after your treatment, we encourage you to take your nausea medication as directed.  BELOW ARE SYMPTOMS THAT SHOULD BE REPORTED IMMEDIATELY: *FEVER GREATER THAN 100.4 F (38 C) OR HIGHER *CHILLS OR SWEATING *NAUSEA AND VOMITING THAT IS NOT CONTROLLED WITH YOUR NAUSEA MEDICATION *UNUSUAL SHORTNESS OF BREATH *UNUSUAL BRUISING OR BLEEDING *URINARY PROBLEMS (pain or burning when urinating, or frequent urination) *BOWEL PROBLEMS (unusual diarrhea, constipation, pain near the anus) TENDERNESS IN MOUTH AND THROAT WITH OR WITHOUT PRESENCE OF ULCERS (sore throat, sores in mouth, or a toothache) UNUSUAL RASH, SWELLING OR PAIN  UNUSUAL VAGINAL DISCHARGE OR ITCHING   Items with * indicate a potential emergency and should be followed up as soon as possible or go to the Emergency Department if any problems should occur.  Please show the CHEMOTHERAPY ALERT CARD or  IMMUNOTHERAPY ALERT CARD at check-in to the Emergency Department and triage nurse.  Should you have questions after your visit or need to cancel or reschedule your appointment, please contact [facility]  Dept: [phone]  and follow the prompts.  Office hours are 8:00 a.m. to 4:30 p.m. Monday - Friday. Please note that voicemails left after 4:00 p.m. may not be returned until the following business day.  We are closed weekends and major holidays. You have access to a nurse at all times for urgent questions. Please call the main number to the clinic Dept: [phone] and follow the prompts.   For any non-urgent questions, you may also contact your provider using MyChart. We now offer e-Visits for anyone 49 and older to request care online for non-urgent symptoms. For details visit [email or web address].   Also download the MyChart app! Go to the app store, search "MyChart", open the app, select [facility], and log in with your MyChart username and password.  Masks are optional in the cancer centers. If you would like for your care team to wear a mask while they are taking care of you, please let them know. You may have one support person who is at least 68 years old accompany you for your appointments.

## 2022-10-06 NOTE — Progress Notes (Signed)
[facility] [phone]   [phone]  PROGRESS NOTE  Patient Care Team: Orson Slick, MD as PCP - General (Hematology and Oncology)  Hematological/Oncological History # Follicular Lymphoma. Stage III 1) 01/07/2020: CT Neck W contrast showed cervical lymphadenopathy bilaterally, left greater than right. Numerous enlarged lymph nodes are present 2)  01/22/2020: [place] Core biopsy performed which revealed overall features consistent with involvement by non-Hodgkin's B-cell lymphoma and the morphologic and phenotypic findings favor follicular lymphoma.  3) 01/31/2020: establish care with Dr. Lorenso Courier  4) 02/12/2020: PET CT scan demonstrates bulky adenopathy in the neck, chest, abdomen and pelvis and diffuse skeletal uptake is suspicious for (but not diagnostic) of marrow involvement. 5) 04/30/2020: Cycle 1 Day 1 of Bendamustine/Rituximab 6) 05/29/2020: Cycle 2 Day 1 of Bendamustine/Rituximab 7) 06/25/2020: Cycle 3 Day 1 of Bendamustine/Rituximab 8) 07/23/2020: Cycle 4 Day 1 of Bendamustine/Rituximab 9) 08/25/2020: Cycle 5 Day 1 of Bendamustine/Rituximab 10)09/17/2020: Cycle 6 Day 1 of Bendamustine/Rituximab 11) 10/26/2020: PET CT scan shows partial response (Deauville Score 3 in lymph nodes but some residual FDG avidity in the bone marrow).  12) 04/27/2021: CT scan showed interval decreased adenopathy below the diaphragm, consistent with treatment response. No new or enlarging suspicious lymph nodes 13) 10/27/2021: CT scan showed increased left inguinal adenopathy, including a 1.6 cm left inguinal lymph node which was previously 0.9 cm in short axis. 14) 04/30/2022: CT scan shows progressive adenopathy below the diaphragm with new thoracic adenopathy and increased hazy retroperitoneal stranding, consistent with worsening lymphomatous disease. 15) 05/05/2022: Doppler US of lower extremities revealed bilateral lymph nodes in the groin.   16) 06/10/2022: Echo: EF 60-65% 17)  06/29/2022: Cycle 1 of R-CHOP 18) 07/26/2022: Cycle 2 of R-CHOP 19) 08/17/2022: Cycle 3 of R-CHOP 20) 09/07/2022: Cycle 4 of R-CHOP 21) 09/29/2022: Cycle 5 of R-CHOP  Interval History:  Jorge Gould 66 y.o. male with medical history significant for Stage III follicular lymphoma who presents for a follow up visit. The patient's last visit was on 09/07/2022. In the interim since the last visit, he completed Cycle 4 of R-CHOP.  On exam today Jorge Gould reports he had a good trip to [place] last week.  He unfortunately missed his chemotherapy visit at that time.  He reports that the last chemotherapy did not result in any side effects.  He does not have any trouble with nausea, vomiting, or diarrhea.  He denies any fevers, chills, sweats.  He continues to have left leg swelling with the left leg greater than the right leg.  He notes in the morning they are about the same size but throughout the day of the left leg swells more.  He notes that he is not having any trouble with discomfort.  He notes that he is eating well and that his weight is at 238.  He reports that he feels "lighter and better".  He notes that rarely he has fatigue but if he does get tired he just sits down and rests.  The rest of 10 point ROS is below.  Overall he is willing and able to proceed with treatment today.   MEDICAL HISTORY:  Past Medical History:  Diagnosis Date   Cancer ([place]) 4540   follicular lymphoma   Obesity     SURGICAL HISTORY: Past Surgical History:  Procedure Laterality Date   ANKLE FRACTURE SURGERY  2016   right, trauma repair after fall down stairs   HERNIA REPAIR     umbilical   IR  IMAGING GUIDED PORT INSERTION  04/09/2020   IR IMAGING GUIDED PORT INSERTION  06/17/2022   IR REMOVAL TUN ACCESS W/ PORT W/O FL MOD SED  11/27/2020   TONSILLECTOMY      SOCIAL HISTORY: Social History   Socioeconomic History   Marital status: Married    Spouse name: Not on file   Number of children: Not on file    Years of education: Not on file   Highest education level: Not on file  Occupational History   Not on file  Tobacco Use   Smoking status: Never   Smokeless tobacco: Never  Vaping Use   Vaping Use: Never used  Substance and Sexual Activity   Alcohol use: Yes    Alcohol/week: 3.0 standard drinks of alcohol    Types: 3 Shots of liquor per week   Drug use: Not Currently   Sexual activity: Not on file  Other Topics Concern   Not on file  Social History Narrative   Engaged, plan to marry in March 2021, been together since 2014.  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   Most exercise on the job, working 7 days per week.   No children, but fiance has 5 children.    11/2019   Social Determinants of Health   Financial Resource Strain: Not on file  Food Insecurity: Not on file  Transportation Needs: Not on file  Physical Activity: Not on file  Stress: Not on file  Social Connections: Not on file  Intimate Partner Violence: Not on file    FAMILY HISTORY: Family History  Problem Relation Age of Onset   Cancer Mother        lung; former smoker   COPD Father        emphysema   Diabetes Maternal Aunt    Heart disease Maternal Uncle    Heart disease Maternal Uncle    Diabetes Maternal Aunt    Stroke Neg Hx    Hyperlipidemia Neg Hx    Hypertension Neg Hx     ALLERGIES:  has No Known Allergies.  MEDICATIONS:  Current Outpatient Medications  Medication Sig Dispense Refill   acetaminophen (TYLENOL) 500 MG tablet Take 2 tablets (1,000 mg total) by mouth every 6 (six) hours as needed. 30 tablet 0   amLODipine (NORVASC) 5 MG tablet Take 1 tablet (5 mg total) by mouth daily. 30 tablet 3   ondansetron (ZOFRAN) 8 MG tablet Take 1 tablet (8 mg total) by mouth every 8 (eight) hours as needed for nausea or vomiting. 30 tablet 1   predniSONE (DELTASONE) 20 MG tablet Take 3 tablets (60 mg total) by mouth daily with breakfast. Take 3 tablets by mouth on days 1-5 of chemotherapy.  Take with food 15 tablet 3   prochlorperazine (COMPAZINE) 10 MG tablet TAKE 1 TABLET(10 MG) BY MOUTH EVERY 6 HOURS AS NEEDED FOR NAUSEA OR VOMITING 90 tablet 0   No current facility-administered medications for this visit.   Facility-Administered Medications Ordered in Other Visits  Medication Dose Route Frequency Provider Last Rate Last Admin   sodium chloride flush (NS) 0.9 % injection 10 mL  10 mL Intracatheter PRN Ledell Peoples IV, MD   10 mL at 10/06/22 1506    REVIEW OF SYSTEMS:   Constitutional: ( - ) fevers, ( - )  chills , ( - ) night sweats Eyes: ( - ) blurriness of vision, ( - ) double vision, ( - ) watery eyes Ears, nose, mouth,  throat, and face: ( - ) mucositis, ( - ) sore throat Respiratory: ( - ) cough, ( - ) dyspnea, ( - ) wheezes Cardiovascular: ( - ) palpitation, ( - ) chest discomfort, ( - ) lower extremity swelling Gastrointestinal:  ( - ) nausea, ( - ) heartburn, ( - ) change in bowel habits Skin: ( - ) abnormal skin rashes Lymphatics: ( - ) new lymphadenopathy, ( - ) easy bruising Neurological: ( - ) numbness, ( - ) tingling, ( - ) new weaknesses Behavioral/Psych: ( - ) mood change, ( - ) new changes  All other systems were reviewed with the patient and are negative.  PHYSICAL EXAMINATION: ECOG PERFORMANCE STATUS: 1 - Symptomatic but completely ambulatory  Vitals:   10/06/22 0934  BP: (!) 157/94  Pulse: 60  Resp: 16  Temp: 98 F (36.7 C)  SpO2: 100%     Filed Weights   10/06/22 0934  Weight: 238 lb 8 oz (108.2 kg)      GENERAL: well appearing middle aged Caucasian male in NAD  SKIN: skin color, texture, turgor are normal, no rashes or significant lesions EYES: conjunctiva are pink and non-injected, sclera clear LUNGS: clear to auscultation and percussion with normal breathing effort HEART: regular rate & rhythm and no murmurs and +1 pitting edema in lower extremity bilaterally.  Musculoskeletal: no cyanosis of digits and no clubbing  PSYCH:  alert & oriented x 3, fluent speech NEURO: no focal motor/sensory deficits  LABORATORY DATA:  I have reviewed the data as listed    Latest Ref Rng & Units 10/06/2022    8:55 AM 09/07/2022    8:45 AM 08/17/2022    9:09 AM  CBC  WBC 4.0 - 10.5 K/uL 5.2  4.6  5.4   Hemoglobin 13.0 - 17.0 g/dL 12.8  12.5  13.3   Hematocrit 39.0 - 52.0 % 36.5  35.6  37.1   Platelets 150 - 400 K/uL 162  292  266        Latest Ref Rng & Units 10/06/2022    8:55 AM 09/07/2022    8:45 AM 08/17/2022    9:09 AM  CMP  Glucose 70 - 99 mg/dL 124  119  130   BUN 8 - 23 mg/dL '15  14  18   '$ Creatinine 0.61 - 1.24 mg/dL 0.98  0.99  0.92   Sodium 135 - 145 mmol/L 140  141  140   Potassium 3.5 - 5.1 mmol/L 3.8  4.0  3.7   Chloride 98 - 111 mmol/L 105  106  107   CO2 22 - 32 mmol/L '27  28  28   '$ Calcium 8.9 - 10.3 mg/dL 9.5  9.1  9.2   Total Protein 6.5 - 8.1 g/dL 7.0  6.7  6.8   Total Bilirubin 0.3 - 1.2 mg/dL 0.7  0.6  0.7   Alkaline Phos 38 - 126 U/L 86  75  78   AST 15 - 41 U/L '23  13  13   '$ ALT 0 - 44 U/L '28  15  13    '$ RADIOGRAPHIC STUDIES: I have personally reviewed the radiological images as listed and agreed with the findings in the report: Marked response in the patient's lymphadenopathy and decrease in his FDG avidity.  Bone marrow involvement also appears considerably less.  Overall consistent with a partial response.  No results found.  ASSESSMENT & PLAN Jorge Gould 66 y.o. male with medical history significant for diagnosed follicular lymphoma  who presents for a follow up visit.   He completed 6 cycles of Bendamustine/Rituximab therapy from 04/30/2020-09/17/2020. Repeat PET CT scan on 10/26/2020 showed a partial response. OK to enter observation.   CT scan from 04/30/2022 and doppler US from 05/05/2022 showed enlarging adenopathy. Recommended second line therapy with R-CHOP. Regimen consists of Rituximab 375 mg/m2, cytoxan 750 mg/m2, vincristine 2 mg IV, adriamycin 50 mg/m2 along with prednisone  60 mg PO on days 1-5. This regimen is given every 21 days.   # Follicular Lymphoma. Stage III --patient completed Cycle 6 of R-Benda therapy. This was administered with curative intent.  --PET CT scan after 6 cycles of R-Benda showed substantial partial response. Residual mild hypermetabolism within the retroperitoneal and left common iliac chains (Deauville score 3). No residual metabolically active adenopathy in the neck or chest --per [place] Criteria, patient is a Partial Response.  --CT neck and CAP from 04/30/2022 showed progressive adenopathy.  --Doppler  US from 05/05/2022 showed progressive inguinal adenopathy, bilateral. --Recommend systemic therapy with R-CHOP started on 06/29/2022 ----Baseline ECHO from 06/10/2022 shows EF 60-65% Plan:  --Due to start Cycle 5 of R-CHOP today. Delayed by 1 week due to patient cancelling visit last week.  --Labs from today were reviewed and adequate for treatment. The labs show white blood cell 5.2, hemoglobin 12.8, MCV 100, and platelets of 162 --Proceed with treatment today without any modifications --RTC in 3 weeks for labs and f/u visit   # Left Lower Leg Swelling, improved --Patient has no more redness and +1 pitting edema of left lower extremity. Swelling L>R --Doppler US from 05/05/2022 showed no evidence of DVT. There are enlarged lymph nodes noted in the groin, bilaterally which is likely causing the swelling.   #Supportive Care -- port placement done -- zofran '8mg'$  q8H PRN and compazine '10mg'$  PO q6H for nausea -- EMLA cream for port -- no pain medication required at this time.   Orders Placed This Encounter  Procedures   CBC with Differential ([facility] Only)    Standing Status:   Future    Standing Expiration Date:   10/27/2023   CMP ([facility] only)    Standing Status:   Future    Standing Expiration Date:   10/27/2023    All questions were answered. The patient knows to call the clinic with any problems, questions or  concerns.  I have spent a total of 30 minutes minutes of face-to-face and non-face-to-face time, preparing to see the patient,  performing a medically appropriate examination, counseling and educating the patient, ordering medications, documenting clinical information in the electronic health record, and care coordination.   Ledell Peoples, MD Department of Hematology/Oncology [facility] Phone: [phone] Pager: [phone] Email: [email or web address]  10/06/2022 6:34 PM

## 2022-10-07 ENCOUNTER — Other Ambulatory Visit: Payer: Self-pay

## 2022-10-07 ENCOUNTER — Inpatient Hospital Stay: Payer: Medicare Other

## 2022-10-08 ENCOUNTER — Inpatient Hospital Stay: Payer: Medicare Other | Attending: Hematology and Oncology

## 2022-10-08 VITALS — BP 145/87 | HR 51 | Temp 98.0°F | Resp 20

## 2022-10-08 DIAGNOSIS — C8220 Follicular lymphoma grade III, unspecified, unspecified site: Secondary | ICD-10-CM | POA: Insufficient documentation

## 2022-10-08 DIAGNOSIS — C8233 Follicular lymphoma grade IIIa, intra-abdominal lymph nodes: Secondary | ICD-10-CM

## 2022-10-08 DIAGNOSIS — Z79899 Other long term (current) drug therapy: Secondary | ICD-10-CM | POA: Diagnosis not present

## 2022-10-08 DIAGNOSIS — Z5111 Encounter for antineoplastic chemotherapy: Secondary | ICD-10-CM | POA: Diagnosis present

## 2022-10-08 DIAGNOSIS — Z5112 Encounter for antineoplastic immunotherapy: Secondary | ICD-10-CM | POA: Diagnosis present

## 2022-10-08 MED ORDER — PEGFILGRASTIM-CBQV 6 MG/0.6ML ~~LOC~~ SOSY
6.0000 mg | PREFILLED_SYRINGE | Freq: Once | SUBCUTANEOUS | Status: AC
  Administered 2022-10-08: 6 mg via SUBCUTANEOUS
  Filled 2022-10-08: qty 0.6

## 2022-10-09 ENCOUNTER — Other Ambulatory Visit: Payer: Self-pay

## 2022-10-10 ENCOUNTER — Encounter: Payer: Self-pay | Admitting: Hematology and Oncology

## 2022-10-26 MED FILL — Fosaprepitant Dimeglumine For IV Infusion 150 MG (Base Eq): INTRAVENOUS | Qty: 5 | Status: AC

## 2022-10-26 MED FILL — Dexamethasone Sodium Phosphate Inj 100 MG/10ML: INTRAMUSCULAR | Qty: 1 | Status: AC

## 2022-10-27 ENCOUNTER — Other Ambulatory Visit: Payer: Self-pay

## 2022-10-27 ENCOUNTER — Inpatient Hospital Stay (HOSPITAL_BASED_OUTPATIENT_CLINIC_OR_DEPARTMENT_OTHER): Payer: Medicare Other | Admitting: Hematology and Oncology

## 2022-10-27 ENCOUNTER — Inpatient Hospital Stay: Payer: Medicare Other

## 2022-10-27 VITALS — BP 136/94 | HR 58 | Resp 17

## 2022-10-27 DIAGNOSIS — C8233 Follicular lymphoma grade IIIa, intra-abdominal lymph nodes: Secondary | ICD-10-CM

## 2022-10-27 DIAGNOSIS — Z95828 Presence of other vascular implants and grafts: Secondary | ICD-10-CM

## 2022-10-27 DIAGNOSIS — Z5112 Encounter for antineoplastic immunotherapy: Secondary | ICD-10-CM | POA: Diagnosis not present

## 2022-10-27 LAB — CBC WITH DIFFERENTIAL (CANCER CENTER ONLY)
Abs Immature Granulocytes: 0.05 10*3/uL (ref 0.00–0.07)
Basophils Absolute: 0 10*3/uL (ref 0.0–0.1)
Basophils Relative: 1 %
Eosinophils Absolute: 0 10*3/uL (ref 0.0–0.5)
Eosinophils Relative: 1 %
HCT: 34.1 % — ABNORMAL LOW (ref 39.0–52.0)
Hemoglobin: 12 g/dL — ABNORMAL LOW (ref 13.0–17.0)
Immature Granulocytes: 1 %
Lymphocytes Relative: 7 %
Lymphs Abs: 0.3 10*3/uL — ABNORMAL LOW (ref 0.7–4.0)
MCH: 35 pg — ABNORMAL HIGH (ref 26.0–34.0)
MCHC: 35.2 g/dL (ref 30.0–36.0)
MCV: 99.4 fL (ref 80.0–100.0)
Monocytes Absolute: 0.6 10*3/uL (ref 0.1–1.0)
Monocytes Relative: 13 %
Neutro Abs: 3.7 10*3/uL (ref 1.7–7.7)
Neutrophils Relative %: 77 %
Platelet Count: 228 10*3/uL (ref 150–400)
RBC: 3.43 MIL/uL — ABNORMAL LOW (ref 4.22–5.81)
RDW: 13.4 % (ref 11.5–15.5)
WBC Count: 4.8 10*3/uL (ref 4.0–10.5)
nRBC: 0 % (ref 0.0–0.2)

## 2022-10-27 LAB — CMP (CANCER CENTER ONLY)
ALT: 17 U/L (ref 0–44)
AST: 21 U/L (ref 15–41)
Albumin: 4.2 g/dL (ref 3.5–5.0)
Alkaline Phosphatase: 69 U/L (ref 38–126)
Anion gap: 4 — ABNORMAL LOW (ref 5–15)
BUN: 16 mg/dL (ref 8–23)
CO2: 29 mmol/L (ref 22–32)
Calcium: 9.4 mg/dL (ref 8.9–10.3)
Chloride: 105 mmol/L (ref 98–111)
Creatinine: 1.04 mg/dL (ref 0.61–1.24)
GFR, Estimated: >60 mL/min (ref >60)
Glucose, Bld: 117 mg/dL — ABNORMAL HIGH (ref 70–99)
Potassium: 3.7 mmol/L (ref 3.5–5.1)
Sodium: 138 mmol/L (ref 135–145)
Total Bilirubin: 0.5 mg/dL (ref 0.3–1.2)
Total Protein: 6.6 g/dL (ref 6.5–8.1)

## 2022-10-27 MED ORDER — SODIUM CHLORIDE 0.9 % IV SOLN
150.0000 mg | Freq: Once | INTRAVENOUS | Status: AC
  Administered 2022-10-27: 150 mg via INTRAVENOUS
  Filled 2022-10-27: qty 150

## 2022-10-27 MED ORDER — SODIUM CHLORIDE 0.9 % IV SOLN
750.0000 mg/m2 | Freq: Once | INTRAVENOUS | Status: AC
  Administered 2022-10-27: 1780 mg via INTRAVENOUS
  Filled 2022-10-27: qty 89

## 2022-10-27 MED ORDER — DOXORUBICIN HCL CHEMO IV INJECTION 2 MG/ML
50.0000 mg/m2 | Freq: Once | INTRAVENOUS | Status: AC
  Administered 2022-10-27: 118 mg via INTRAVENOUS
  Filled 2022-10-27: qty 59

## 2022-10-27 MED ORDER — CETIRIZINE HCL 10 MG/ML IV SOLN
10.0000 mg | Freq: Once | INTRAVENOUS | Status: AC
  Administered 2022-10-27: 10 mg via INTRAVENOUS
  Filled 2022-10-27: qty 1

## 2022-10-27 MED ORDER — SODIUM CHLORIDE 0.9 % IV SOLN: Freq: Once | INTRAVENOUS | Status: AC

## 2022-10-27 MED ORDER — VINCRISTINE SULFATE CHEMO INJECTION 1 MG/ML
2.0000 mg | Freq: Once | INTRAVENOUS | Status: AC
  Administered 2022-10-27: 2 mg via INTRAVENOUS
  Filled 2022-10-27: qty 2

## 2022-10-27 MED ORDER — SODIUM CHLORIDE 0.9 % IV SOLN
10.0000 mg | Freq: Once | INTRAVENOUS | Status: AC
  Administered 2022-10-27: 10 mg via INTRAVENOUS
  Filled 2022-10-27: qty 10

## 2022-10-27 MED ORDER — PALONOSETRON HCL INJECTION 0.25 MG/5ML
0.2500 mg | Freq: Once | INTRAVENOUS | Status: AC
  Administered 2022-10-27: 0.25 mg via INTRAVENOUS
  Filled 2022-10-27: qty 5

## 2022-10-27 MED ORDER — HEPARIN SOD (PORK) LOCK FLUSH 100 UNIT/ML IV SOLN
500.0000 [IU] | Freq: Once | INTRAVENOUS | Status: AC | PRN
  Administered 2022-10-27: 500 [IU]

## 2022-10-27 MED ORDER — SODIUM CHLORIDE 0.9 % IV SOLN
375.0000 mg/m2 | Freq: Once | INTRAVENOUS | Status: AC
  Administered 2022-10-27: 900 mg via INTRAVENOUS
  Filled 2022-10-27: qty 50

## 2022-10-27 MED ORDER — SODIUM CHLORIDE 0.9% FLUSH
10.0000 mL | Freq: Once | INTRAVENOUS | Status: AC
  Administered 2022-10-27: 10 mL

## 2022-10-27 MED ORDER — SODIUM CHLORIDE 0.9% FLUSH
10.0000 mL | INTRAVENOUS | Status: DC | PRN
  Administered 2022-10-27: 10 mL

## 2022-10-27 MED ORDER — ACETAMINOPHEN 325 MG PO TABS
650.0000 mg | ORAL_TABLET | Freq: Once | ORAL | Status: AC
  Administered 2022-10-27: 650 mg via ORAL
  Filled 2022-10-27: qty 2

## 2022-10-27 NOTE — Progress Notes (Signed)
[place] [phone]) [phone]   Fax:(336) [phone]  PROGRESS NOTE  Patient Care Team: Orson Slick, MD as PCP - General (Hematology and Oncology)  Hematological/Oncological History # Follicular Lymphoma. Stage III 1) 01/07/2020: CT Neck W contrast showed cervical lymphadenopathy bilaterally, left greater than right. Numerous enlarged lymph nodes are present 2)  01/22/2020: [place] Core biopsy performed which revealed overall features consistent with involvement by non-Hodgkin's B-cell lymphoma and the morphologic and phenotypic findings favor follicular lymphoma.  3) 01/31/2020: establish care with Dr. Lorenso Courier  4) 02/12/2020: PET CT scan demonstrates bulky adenopathy in the neck, chest, abdomen and pelvis and diffuse skeletal uptake is suspicious for (but not diagnostic) of marrow involvement. 5) 04/30/2020: Cycle 1 Day 1 of Bendamustine/Rituximab 6) 05/29/2020: Cycle 2 Day 1 of Bendamustine/Rituximab 7) 06/25/2020: Cycle 3 Day 1 of Bendamustine/Rituximab 8) 07/23/2020: Cycle 4 Day 1 of Bendamustine/Rituximab 9) 08/25/2020: Cycle 5 Day 1 of Bendamustine/Rituximab 10)09/17/2020: Cycle 6 Day 1 of Bendamustine/Rituximab 11) 10/26/2020: PET CT scan shows partial response (Deauville Score 3 in lymph nodes but some residual FDG avidity in the bone marrow).  12) 04/27/2021: CT scan showed interval decreased adenopathy below the diaphragm, consistent with treatment response. No new or enlarging suspicious lymph nodes 13) 10/27/2021: CT scan showed increased left inguinal adenopathy, including a 1.6 cm left inguinal lymph node which was previously 0.9 cm in short axis. 14) 04/30/2022: CT scan shows progressive adenopathy below the diaphragm with new thoracic adenopathy and increased hazy retroperitoneal stranding, consistent with worsening lymphomatous disease. 15) 05/05/2022: Doppler US of lower extremities revealed bilateral lymph nodes in the groin.   16) 06/10/2022: Echo: EF 60-65% 17)  06/29/2022: Cycle 1 of R-CHOP 18) 07/26/2022: Cycle 2 of R-CHOP 19) 08/17/2022: Cycle 3 of R-CHOP 20) 09/07/2022: Cycle 4 of R-CHOP 21) 09/29/2022: Cycle 5 of R-CHOP 22) 10/27/2022: Cycle 6 of R-CHOP  Interval History:  Jorge Gould 66 y.o. male with medical history significant for Stage III follicular lymphoma who presents for a follow up visit. The patient's last visit was on 09/29/2022. In the interim since the last visit, he completed Cycle 5 of R-CHOP.  On exam today Jorge Gould reports he has had a stressful time since her last visit.  He unfortunately had to put his mom in nursing home in [place].  He notes that he does have more loose stools/diarrhea after his chemotherapy cycles.  He is also developing more fatigue though he is "pushing it to the limit".  He is down approximately 1.5 pounds in the interim since her last visit.  He notes he is not having any nausea or vomiting.  He notes that he feels "improved but not perfect" he is always "in a hurry".  He notes he is doing his best to try to eat well and unfortunately has been on the move.  He denies any fevers, chills, sweats.  Overall he is willing and able to proceed with treatment today. A full 10 point ROS was otherwise negative.    MEDICAL HISTORY:  Past Medical History:  Diagnosis Date   Cancer ([place]) 6720   follicular lymphoma   Obesity     SURGICAL HISTORY: Past Surgical History:  Procedure Laterality Date   ANKLE FRACTURE SURGERY  2016   right, trauma repair after fall down stairs   HERNIA REPAIR     umbilical   IR IMAGING GUIDED PORT INSERTION  04/09/2020   IR IMAGING GUIDED PORT INSERTION  06/17/2022   IR REMOVAL TUN ACCESS  W/ PORT W/O FL MOD SED  11/27/2020   TONSILLECTOMY      SOCIAL HISTORY: Social History   Socioeconomic History   Marital status: Married    Spouse name: Not on file   Number of children: Not on file   Years of education: Not on file   Highest education level: Not on file  Occupational  History   Not on file  Tobacco Use   Smoking status: Never   Smokeless tobacco: Never  Vaping Use   Vaping Use: Never used  Substance and Sexual Activity   Alcohol use: Yes    Alcohol/week: 3.0 standard drinks of alcohol    Types: 3 Shots of liquor per week   Drug use: Not Currently   Sexual activity: Not on file  Other Topics Concern   Not on file  Social History Narrative   Engaged, plan to marry in March 2021, been together since 2014.  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   Most exercise on the job, working 7 days per week.   No children, but fiance has 5 children.    11/2019   Social Determinants of Health   Financial Resource Strain: Not on file  Food Insecurity: Not on file  Transportation Needs: Not on file  Physical Activity: Not on file  Stress: Not on file  Social Connections: Not on file  Intimate Partner Violence: Not on file    FAMILY HISTORY: Family History  Problem Relation Age of Onset   Cancer Mother        lung; former smoker   COPD Father        emphysema   Diabetes Maternal Aunt    Heart disease Maternal Uncle    Heart disease Maternal Uncle    Diabetes Maternal Aunt    Stroke Neg Hx    Hyperlipidemia Neg Hx    Hypertension Neg Hx     ALLERGIES:  has No Known Allergies.  MEDICATIONS:  Current Outpatient Medications  Medication Sig Dispense Refill   acetaminophen (TYLENOL) 500 MG tablet Take 2 tablets (1,000 mg total) by mouth every 6 (six) hours as needed. 30 tablet 0   amLODipine (NORVASC) 5 MG tablet Take 1 tablet (5 mg total) by mouth daily. 30 tablet 3   ondansetron (ZOFRAN) 8 MG tablet Take 1 tablet (8 mg total) by mouth every 8 (eight) hours as needed for nausea or vomiting. 30 tablet 1   predniSONE (DELTASONE) 20 MG tablet Take 3 tablets (60 mg total) by mouth daily with breakfast. Take 3 tablets by mouth on days 1-5 of chemotherapy. Take with food 15 tablet 3   prochlorperazine (COMPAZINE) 10 MG tablet TAKE 1  TABLET(10 MG) BY MOUTH EVERY 6 HOURS AS NEEDED FOR NAUSEA OR VOMITING 90 tablet 0   No current facility-administered medications for this visit.   Facility-Administered Medications Ordered in Other Visits  Medication Dose Route Frequency Provider Last Rate Last Admin   sodium chloride flush (NS) 0.9 % injection 10 mL  10 mL Intracatheter PRN Orson Slick, MD   10 mL at 10/27/22 1750    REVIEW OF SYSTEMS:   Constitutional: ( - ) fevers, ( - )  chills , ( - ) night sweats Eyes: ( - ) blurriness of vision, ( - ) double vision, ( - ) watery eyes Ears, nose, mouth, throat, and face: ( - ) mucositis, ( - ) sore throat Respiratory: ( - ) cough, ( - ) dyspnea, ( - )  wheezes Cardiovascular: ( - ) palpitation, ( - ) chest discomfort, ( - ) lower extremity swelling Gastrointestinal:  ( - ) nausea, ( - ) heartburn, ( - ) change in bowel habits Skin: ( - ) abnormal skin rashes Lymphatics: ( - ) new lymphadenopathy, ( - ) easy bruising Neurological: ( - ) numbness, ( - ) tingling, ( - ) new weaknesses Behavioral/Psych: ( - ) mood change, ( - ) new changes  All other systems were reviewed with the patient and are negative.  PHYSICAL EXAMINATION: ECOG PERFORMANCE STATUS: 1 - Symptomatic but completely ambulatory  Vitals:   10/27/22 1304  BP: (!) 150/89  Pulse: (!) 54  Resp: 16  Temp: 98.1 F (36.7 C)  SpO2: 97%     Filed Weights   10/27/22 1304  Weight: 237 lb 8 oz (107.7 kg)   GENERAL: well appearing middle aged Caucasian male in NAD  SKIN: skin color, texture, turgor are normal, no rashes or significant lesions EYES: conjunctiva are pink and non-injected, sclera clear LUNGS: clear to auscultation and percussion with normal breathing effort HEART: regular rate & rhythm and no murmurs and +1 pitting edema in lower extremity bilaterally.  Musculoskeletal: no cyanosis of digits and no clubbing  PSYCH: alert & oriented x 3, fluent speech NEURO: no focal motor/sensory  deficits  LABORATORY DATA:  I have reviewed the data as listed    Latest Ref Rng & Units 10/27/2022   12:30 PM 10/06/2022    8:55 AM 09/07/2022    8:45 AM  CBC  WBC 4.0 - 10.5 K/uL 4.8  5.2  4.6   Hemoglobin 13.0 - 17.0 g/dL 12.0  12.8  12.5   Hematocrit 39.0 - 52.0 % 34.1  36.5  35.6   Platelets 150 - 400 K/uL 228  162  292        Latest Ref Rng & Units 10/27/2022   12:30 PM 10/06/2022    8:55 AM 09/07/2022    8:45 AM  CMP  Glucose 70 - 99 mg/dL 117  124  119   BUN 8 - 23 mg/dL '16  15  14   '$ Creatinine 0.61 - 1.24 mg/dL 1.04  0.98  0.99   Sodium 135 - 145 mmol/L 138  140  141   Potassium 3.5 - 5.1 mmol/L 3.7  3.8  4.0   Chloride 98 - 111 mmol/L 105  105  106   CO2 22 - 32 mmol/L '29  27  28   '$ Calcium 8.9 - 10.3 mg/dL 9.4  9.5  9.1   Total Protein 6.5 - 8.1 g/dL 6.6  7.0  6.7   Total Bilirubin 0.3 - 1.2 mg/dL 0.5  0.7  0.6   Alkaline Phos 38 - 126 U/L 69  86  75   AST 15 - 41 U/L '21  23  13   '$ ALT 0 - 44 U/L '17  28  15    '$ RADIOGRAPHIC STUDIES: I have personally reviewed the radiological images as listed and agreed with the findings in the report: Marked response in the patient's lymphadenopathy and decrease in his FDG avidity.  Bone marrow involvement also appears considerably less.  Overall consistent with a partial response.  No results found.  ASSESSMENT & PLAN DAMON HARGROVE 66 y.o. male with medical history significant for diagnosed follicular lymphoma who presents for a follow up visit.   He completed 6 cycles of Bendamustine/Rituximab therapy from 04/30/2020-09/17/2020. Repeat PET CT scan on 10/26/2020 showed a partial response.  OK to enter observation.   CT scan from 04/30/2022 and doppler US from 05/05/2022 showed enlarging adenopathy. Recommended second line therapy with R-CHOP. Regimen consists of Rituximab 375 mg/m2, cytoxan 750 mg/m2, vincristine 2 mg IV, adriamycin 50 mg/m2 along with prednisone 60 mg PO on days 1-5. This regimen is given every 21 days.   #  Follicular Lymphoma. Stage III --patient completed Cycle 6 of R-Benda therapy. This was administered with curative intent.  --PET CT scan after 6 cycles of R-Benda showed substantial partial response. Residual mild hypermetabolism within the retroperitoneal and left common iliac chains (Deauville score 3). No residual metabolically active adenopathy in the neck or chest --per [place] Criteria, patient is a Partial Response.  --CT neck and CAP from 04/30/2022 showed progressive adenopathy.  --Doppler  US from 05/05/2022 showed progressive inguinal adenopathy, bilateral. --Recommend systemic therapy with R-CHOP started on 06/29/2022 ----Baseline ECHO from 06/10/2022 shows EF 60-65% Plan:  --Due to start Cycle 6 of R-CHOP today. --Labs from today were reviewed and adequate for treatment. The labs show white blood cell 4.8, hemoglobin 12.0, MCV 99.4, and platelets of 228 --Proceed with treatment today without any modifications --RTC in 3 weeks for labs and f/u visit   # Left Lower Leg Swelling, improved --Patient has no more redness and +1 pitting edema of left lower extremity. Swelling L>R --Doppler US from 05/05/2022 showed no evidence of DVT. There are enlarged lymph nodes noted in the groin, bilaterally which is likely causing the swelling.   #Supportive Care -- port placement done -- zofran '8mg'$  q8H PRN and compazine '10mg'$  PO q6H for nausea -- EMLA cream for port -- no pain medication required at this time.   Orders Placed This Encounter  Procedures   CT CHEST ABDOMEN PELVIS W CONTRAST    Standing Status:   Future    Standing Expiration Date:   10/31/2023    Order Specific Question:   If indicated for the ordered procedure, I authorize the administration of contrast media per Radiology protocol    Answer:   Yes    Order Specific Question:   Does the patient have a contrast media/X-ray dye allergy?    Answer:   No    Order Specific Question:   Preferred imaging location?    Answer:   [facility]    Order Specific Question:   Release to patient    Answer:   Immediate    Order Specific Question:   Is Oral Contrast requested for this exam?    Answer:   Yes, Per Radiology protocol    All questions were answered. The patient knows to call the clinic with any problems, questions or concerns.  I have spent a total of 30 minutes minutes of face-to-face and non-face-to-face time, preparing to see the patient,  performing a medically appropriate examination, counseling and educating the patient, ordering medications, documenting clinical information in the electronic health record, and care coordination.   Ledell Peoples, MD Department of Hematology/Oncology [facility] Phone: [phone] Pager: [phone] Email: [email or web address]  10/30/2022 11:58 AM

## 2022-10-28 ENCOUNTER — Other Ambulatory Visit: Payer: Self-pay

## 2022-10-29 ENCOUNTER — Ambulatory Visit: Payer: Medicare Other

## 2022-10-30 ENCOUNTER — Encounter: Payer: Self-pay | Admitting: Hematology and Oncology

## 2022-10-30 ENCOUNTER — Inpatient Hospital Stay: Payer: Medicare Other

## 2022-10-30 ENCOUNTER — Other Ambulatory Visit: Payer: Self-pay

## 2022-10-30 VITALS — BP 153/87 | HR 50 | Temp 97.9°F | Resp 17 | Ht 72.0 in

## 2022-10-30 DIAGNOSIS — Z5112 Encounter for antineoplastic immunotherapy: Secondary | ICD-10-CM | POA: Diagnosis not present

## 2022-10-30 DIAGNOSIS — C8233 Follicular lymphoma grade IIIa, intra-abdominal lymph nodes: Secondary | ICD-10-CM

## 2022-10-30 MED ORDER — PEGFILGRASTIM-CBQV 6 MG/0.6ML ~~LOC~~ SOSY
6.0000 mg | PREFILLED_SYRINGE | Freq: Once | SUBCUTANEOUS | Status: AC
  Administered 2022-10-30: 6 mg via SUBCUTANEOUS

## 2022-11-02 ENCOUNTER — Telehealth: Payer: Self-pay | Admitting: Hematology and Oncology

## 2022-11-02 NOTE — Telephone Encounter (Signed)
Called patient to notify of new appointments. Left voicemail with new appointment information. Mailing reminder.

## 2022-11-03 ENCOUNTER — Other Ambulatory Visit: Payer: Self-pay

## 2022-11-08 ENCOUNTER — Other Ambulatory Visit: Payer: Self-pay

## 2022-11-16 ENCOUNTER — Encounter: Payer: Self-pay | Admitting: Hematology and Oncology

## 2022-11-22 ENCOUNTER — Other Ambulatory Visit: Payer: Self-pay | Admitting: Pharmacist

## 2022-11-23 ENCOUNTER — Encounter: Payer: Self-pay | Admitting: Hematology and Oncology

## 2022-11-23 ENCOUNTER — Other Ambulatory Visit: Payer: Self-pay

## 2022-11-23 ENCOUNTER — Inpatient Hospital Stay (HOSPITAL_BASED_OUTPATIENT_CLINIC_OR_DEPARTMENT_OTHER): Payer: Medicare Other

## 2022-11-23 ENCOUNTER — Other Ambulatory Visit: Payer: Self-pay | Admitting: Hematology and Oncology

## 2022-11-23 ENCOUNTER — Inpatient Hospital Stay: Payer: Medicare Other | Attending: Hematology and Oncology | Admitting: Hematology and Oncology

## 2022-11-23 VITALS — BP 153/84 | HR 58 | Temp 97.8°F | Resp 14 | Wt 237.3 lb

## 2022-11-23 DIAGNOSIS — Z8572 Personal history of non-Hodgkin lymphomas: Secondary | ICD-10-CM | POA: Diagnosis not present

## 2022-11-23 DIAGNOSIS — C8233 Follicular lymphoma grade IIIa, intra-abdominal lymph nodes: Secondary | ICD-10-CM

## 2022-11-23 DIAGNOSIS — Z9221 Personal history of antineoplastic chemotherapy: Secondary | ICD-10-CM | POA: Insufficient documentation

## 2022-11-23 DIAGNOSIS — Z95828 Presence of other vascular implants and grafts: Secondary | ICD-10-CM

## 2022-11-23 LAB — CBC WITH DIFFERENTIAL (CANCER CENTER ONLY)
Abs Immature Granulocytes: 0.04 10*3/uL (ref 0.00–0.07)
Basophils Absolute: 0 10*3/uL (ref 0.0–0.1)
Basophils Relative: 1 %
Eosinophils Absolute: 0.1 10*3/uL (ref 0.0–0.5)
Eosinophils Relative: 2 %
HCT: 34.5 % — ABNORMAL LOW (ref 39.0–52.0)
Hemoglobin: 12 g/dL — ABNORMAL LOW (ref 13.0–17.0)
Immature Granulocytes: 1 %
Lymphocytes Relative: 8 %
Lymphs Abs: 0.4 10*3/uL — ABNORMAL LOW (ref 0.7–4.0)
MCH: 34.6 pg — ABNORMAL HIGH (ref 26.0–34.0)
MCHC: 34.8 g/dL (ref 30.0–36.0)
MCV: 99.4 fL (ref 80.0–100.0)
Monocytes Absolute: 0.6 10*3/uL (ref 0.1–1.0)
Monocytes Relative: 13 %
Neutro Abs: 3.5 10*3/uL (ref 1.7–7.7)
Neutrophils Relative %: 75 %
Platelet Count: 188 10*3/uL (ref 150–400)
RBC: 3.47 MIL/uL — ABNORMAL LOW (ref 4.22–5.81)
RDW: 13 % (ref 11.5–15.5)
WBC Count: 4.6 10*3/uL (ref 4.0–10.5)
nRBC: 0 % (ref 0.0–0.2)

## 2022-11-23 LAB — CMP (CANCER CENTER ONLY)
ALT: 13 U/L (ref 0–44)
AST: 13 U/L — ABNORMAL LOW (ref 15–41)
Albumin: 3.8 g/dL (ref 3.5–5.0)
Alkaline Phosphatase: 72 U/L (ref 38–126)
Anion gap: 8 (ref 5–15)
BUN: 16 mg/dL (ref 8–23)
CO2: 26 mmol/L (ref 22–32)
Calcium: 9.1 mg/dL (ref 8.9–10.3)
Chloride: 107 mmol/L (ref 98–111)
Creatinine: 1.02 mg/dL (ref 0.61–1.24)
GFR, Estimated: >60 mL/min (ref >60)
Glucose, Bld: 140 mg/dL — ABNORMAL HIGH (ref 70–99)
Potassium: 3.9 mmol/L (ref 3.5–5.1)
Sodium: 141 mmol/L (ref 135–145)
Total Bilirubin: 0.4 mg/dL (ref 0.3–1.2)
Total Protein: 6.2 g/dL — ABNORMAL LOW (ref 6.5–8.1)

## 2022-11-23 LAB — LACTATE DEHYDROGENASE: LDH: 138 U/L (ref 98–192)

## 2022-11-23 MED ORDER — HEPARIN SOD (PORK) LOCK FLUSH 100 UNIT/ML IV SOLN
500.0000 [IU] | Freq: Once | INTRAVENOUS | Status: AC
  Administered 2022-11-23: 500 [IU]

## 2022-11-23 MED ORDER — SODIUM CHLORIDE 0.9% FLUSH
10.0000 mL | Freq: Once | INTRAVENOUS | Status: AC
  Administered 2022-11-23: 10 mL

## 2022-11-23 NOTE — Progress Notes (Signed)
Jorge Gould   [phone]  PROGRESS NOTE  Patient Care Team: Orson Slick, MD as PCP - General (Hematology and Oncology)  Hematological/Oncological History # Follicular Lymphoma. Stage III 1) 01/07/2020: CT Neck W contrast showed cervical lymphadenopathy bilaterally, left greater than right. Numerous enlarged lymph nodes are present 2)  01/22/2020: [place] Core biopsy performed which revealed overall features consistent with involvement by non-Hodgkin's B-cell lymphoma and the morphologic and phenotypic findings favor follicular lymphoma.  3) 01/31/2020: establish care with Dr. Lorenso Courier  4) 02/12/2020: PET CT scan demonstrates bulky adenopathy in the neck, chest, abdomen and pelvis and diffuse skeletal uptake is suspicious for (but not diagnostic) of marrow involvement. 5) 04/30/2020: Cycle 1 Day 1 of Bendamustine/Rituximab 6) 05/29/2020: Cycle 2 Day 1 of Bendamustine/Rituximab 7) 06/25/2020: Cycle 3 Day 1 of Bendamustine/Rituximab 8) 07/23/2020: Cycle 4 Day 1 of Bendamustine/Rituximab 9) 08/25/2020: Cycle 5 Day 1 of Bendamustine/Rituximab 10)09/17/2020: Cycle 6 Day 1 of Bendamustine/Rituximab 11) 10/26/2020: PET CT scan shows partial response (Deauville Score 3 in lymph nodes but some residual FDG avidity in the bone marrow).  12) 04/27/2021: CT scan showed interval decreased adenopathy below the diaphragm, consistent with treatment response. No new or enlarging suspicious lymph nodes 13) 10/27/2021: CT scan showed increased left inguinal adenopathy, including a 1.6 cm left inguinal lymph node which was previously 0.9 cm in short axis. 14) 04/30/2022: CT scan shows progressive adenopathy below the diaphragm with new thoracic adenopathy and increased hazy retroperitoneal stranding, consistent with worsening lymphomatous disease. 15) 05/05/2022: Doppler US of lower extremities revealed bilateral lymph nodes in the groin.   16) 06/10/2022: Echo: EF 60-65% 17)  06/29/2022: Cycle 1 of R-CHOP 18) 07/26/2022: Cycle 2 of R-CHOP 19) 08/17/2022: Cycle 3 of R-CHOP 20) 09/07/2022: Cycle 4 of R-CHOP 21) 09/29/2022: Cycle 5 of R-CHOP 22) 10/27/2022: Cycle 6 of R-CHOP  Interval History:  Jorge Gould 67 y.o. male with medical history significant for Stage III follicular lymphoma who presents for a follow up visit. The patient's last visit was on 10/27/2022. In the interim since the last visit, he completed Cycle 6 of R-CHOP.  On exam today [name] reports he burned his right arm while loading a pressure washer into his car.  He reports it happened 1 week ago and it is healing appropriately.  He notes there is no redness, tenderness, or signs or symptoms of infection.  He reports overall he has been pretty good.  He does have a little bit of pain still in his shoulder.  He also does have some occasional pain in his left rib particular when he coughs.  His energy levels have been "so-so" and he notes he has been slowing down.  He reports that he does get some hoarseness in his voice particularly at night.  He has been eating well however with a good appetite.  He is not having any nausea, vomiting, diarrhea, lightheadedness, dizziness, or shortness of breath.  He denies any fevers, chills, sweats.  Overall he is willing and able to proceed with treatment today. A full 10 point ROS was otherwise negative.    MEDICAL HISTORY:  Past Medical History:  Diagnosis Date   Cancer ([place]) 5726   follicular lymphoma   Obesity     SURGICAL HISTORY: Past Surgical History:  Procedure Laterality Date   ANKLE FRACTURE SURGERY  2016   right, trauma repair after fall down stairs   HERNIA REPAIR     umbilical   IR  IMAGING GUIDED PORT INSERTION  04/09/2020   IR IMAGING GUIDED PORT INSERTION  06/17/2022   IR REMOVAL TUN ACCESS W/ PORT W/O FL MOD SED  11/27/2020   TONSILLECTOMY      SOCIAL HISTORY: Social History   Socioeconomic History   Marital status: Married    Spouse  name: Not on file   Number of children: Not on file   Years of education: Not on file   Highest education level: Not on file  Occupational History   Not on file  Tobacco Use   Smoking status: Never   Smokeless tobacco: Never  Vaping Use   Vaping Use: Never used  Substance and Sexual Activity   Alcohol use: Yes    Alcohol/week: 3.0 standard drinks of alcohol    Types: 3 Shots of liquor per week   Drug use: Not Currently   Sexual activity: Not on file  Other Topics Concern   Not on file  Social History Narrative   Engaged, plan to marry in March 2021, been together since 2014.  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   Most exercise on the job, working 7 days per week.   No children, but fiance has 5 children.    11/2019   Social Determinants of Health   Financial Resource Strain: Not on file  Food Insecurity: Not on file  Transportation Needs: Not on file  Physical Activity: Not on file  Stress: Not on file  Social Connections: Not on file  Intimate Partner Violence: Not on file    FAMILY HISTORY: Family History  Problem Relation Age of Onset   Cancer Mother        lung; former smoker   COPD Father        emphysema   Diabetes Maternal Aunt    Heart disease Maternal Uncle    Heart disease Maternal Uncle    Diabetes Maternal Aunt    Stroke Neg Hx    Hyperlipidemia Neg Hx    Hypertension Neg Hx     ALLERGIES:  has No Known Allergies.  MEDICATIONS:  Current Outpatient Medications  Medication Sig Dispense Refill   acetaminophen (TYLENOL) 500 MG tablet Take 2 tablets (1,000 mg total) by mouth every 6 (six) hours as needed. 30 tablet 0   amLODipine (NORVASC) 5 MG tablet Take 1 tablet (5 mg total) by mouth daily. 30 tablet 3   ondansetron (ZOFRAN) 8 MG tablet Take 1 tablet (8 mg total) by mouth every 8 (eight) hours as needed for nausea or vomiting. 30 tablet 1   predniSONE (DELTASONE) 20 MG tablet Take 3 tablets (60 mg total) by mouth daily with  breakfast. Take 3 tablets by mouth on days 1-5 of chemotherapy. Take with food 15 tablet 3   prochlorperazine (COMPAZINE) 10 MG tablet TAKE 1 TABLET(10 MG) BY MOUTH EVERY 6 HOURS AS NEEDED FOR NAUSEA OR VOMITING 90 tablet 0   No current facility-administered medications for this visit.   Facility-Administered Medications Ordered in Other Visits  Medication Dose Route Frequency Provider Last Rate Last Admin   sodium chloride flush (NS) 0.9 % injection 10 mL  10 mL Intracatheter PRN Ledell Peoples IV, MD   10 mL at 10/27/22 1750    REVIEW OF SYSTEMS:   Constitutional: ( - ) fevers, ( - )  chills , ( - ) night sweats Eyes: ( - ) blurriness of vision, ( - ) double vision, ( - ) watery eyes Ears, nose, mouth,  throat, and face: ( - ) mucositis, ( - ) sore throat Respiratory: ( - ) cough, ( - ) dyspnea, ( - ) wheezes Cardiovascular: ( - ) palpitation, ( - ) chest discomfort, ( - ) lower extremity swelling Gastrointestinal:  ( - ) nausea, ( - ) heartburn, ( - ) change in bowel habits Skin: ( - ) abnormal skin rashes Lymphatics: ( - ) new lymphadenopathy, ( - ) easy bruising Neurological: ( - ) numbness, ( - ) tingling, ( - ) new weaknesses Behavioral/Psych: ( - ) mood change, ( - ) new changes  All other systems were reviewed with the patient and are negative.  PHYSICAL EXAMINATION: ECOG PERFORMANCE STATUS: 1 - Symptomatic but completely ambulatory  Vitals:   11/23/22 0943  BP: (!) 153/84  Pulse: (!) 58  Resp: 14  Temp: 97.8 F (36.6 C)  SpO2: 98%     Filed Weights   11/23/22 0943  Weight: 237 lb 4.8 oz (107.6 kg)   GENERAL: well appearing middle aged Caucasian male in NAD  SKIN: skin color, texture, turgor are normal, no rashes or significant lesions EYES: conjunctiva are pink and non-injected, sclera clear LUNGS: clear to auscultation and percussion with normal breathing effort HEART: regular rate & rhythm and no murmurs and +1 pitting edema in lower extremity bilaterally.   Musculoskeletal: no cyanosis of digits and no clubbing  PSYCH: alert & oriented x 3, fluent speech NEURO: no focal motor/sensory deficits  LABORATORY DATA:  I have reviewed the data as listed    Latest Ref Rng & Units 11/23/2022    9:25 AM 10/27/2022   12:30 PM 10/06/2022    8:55 AM  CBC  WBC 4.0 - 10.5 K/uL 4.6  4.8  5.2   Hemoglobin 13.0 - 17.0 g/dL 12.0  12.0  12.8   Hematocrit 39.0 - 52.0 % 34.5  34.1  36.5   Platelets 150 - 400 K/uL 188  228  162        Latest Ref Rng & Units 11/23/2022    9:25 AM 10/27/2022   12:30 PM 10/06/2022    8:55 AM  CMP  Glucose 70 - 99 mg/dL 140  117  124   BUN 8 - 23 mg/dL '16  16  15   '$ Creatinine 0.61 - 1.24 mg/dL 1.02  1.04  0.98   Sodium 135 - 145 mmol/L 141  138  140   Potassium 3.5 - 5.1 mmol/L 3.9  3.7  3.8   Chloride 98 - 111 mmol/L 107  105  105   CO2 22 - 32 mmol/L '26  29  27   '$ Calcium 8.9 - 10.3 mg/dL 9.1  9.4  9.5   Total Protein 6.5 - 8.1 g/dL 6.2  6.6  7.0   Total Bilirubin 0.3 - 1.2 mg/dL 0.4  0.5  0.7   Alkaline Phos 38 - 126 U/L 72  69  86   AST 15 - 41 U/L '13  21  23   '$ ALT 0 - 44 U/L '13  17  28    '$ RADIOGRAPHIC STUDIES: I have personally reviewed the radiological images as listed and agreed with the findings in the report: Marked response in the patient's lymphadenopathy and decrease in his FDG avidity.  Bone marrow involvement also appears considerably less.  Overall consistent with a partial response.  No results found.  ASSESSMENT & PLAN Jorge Gould 67 y.o. male with medical history significant for diagnosed follicular lymphoma who presents for a  follow up visit.   He completed 6 cycles of Bendamustine/Rituximab therapy from 04/30/2020-09/17/2020. Repeat PET CT scan on 10/26/2020 showed a partial response. OK to enter observation.   CT scan from 04/30/2022 and doppler US from 05/05/2022 showed enlarging adenopathy. Recommended second line therapy with R-CHOP. Regimen consists of Rituximab 375 mg/m2, cytoxan 750 mg/m2,  vincristine 2 mg IV, adriamycin 50 mg/m2 along with prednisone 60 mg PO on days 1-5. This regimen is given every 21 days.   # Follicular Lymphoma. Stage III --patient completed Cycle 6 of R-Benda therapy. This was administered with curative intent.  --PET CT scan after 6 cycles of R-Benda showed substantial partial response. Residual mild hypermetabolism within the retroperitoneal and left common iliac chains (Deauville score 3). No residual metabolically active adenopathy in the neck or chest --per [place] Criteria, patient is a Partial Response.  --CT neck and CAP from 04/30/2022 showed progressive adenopathy.  --Doppler  US from 05/05/2022 showed progressive inguinal adenopathy, bilateral. --Recommend systemic therapy with R-CHOP started on 06/29/2022 ----Baseline ECHO from 06/10/2022 shows EF 60-65% Plan:  --patient completed 6 cycles of R-CHOP --Labs from today were reviewed and adequate for treatment. The labs show white blood cell 4.6, hemoglobin 12.0, MCV 99.4, Plt 188 -- Patient currently due for CT scan.  Ordered last visit but not yet completed. --RTC in 3 weeks for labs and f/u visit   # Left Lower Leg Swelling, improved --Patient has no more redness and +1 pitting edema of left lower extremity. Swelling L>R --Doppler US from 05/05/2022 showed no evidence of DVT. There are enlarged lymph nodes noted in the groin, bilaterally which is likely causing the swelling.   #Supportive Care -- port placed -- zofran '8mg'$  q8H PRN and compazine '10mg'$  PO q6H for nausea -- EMLA cream for port -- no pain medication required at this time.   No orders of the defined types were placed in this encounter.   All questions were answered. The patient knows to call the clinic with any problems, questions or concerns.  I have spent a total of 30 minutes minutes of face-to-face and non-face-to-face time, preparing to see the patient,  performing a medically appropriate examination, counseling and educating  the patient, ordering medications, documenting clinical information in the electronic health record, and care coordination.   Ledell Peoples, MD Department of Hematology/Oncology [facility] Phone: [phone] Pager: [phone] Email: [email or web address]  11/23/2022 10:38 AM

## 2022-11-24 ENCOUNTER — Other Ambulatory Visit: Payer: Self-pay

## 2022-11-24 ENCOUNTER — Telehealth: Payer: Self-pay | Admitting: Hematology and Oncology

## 2022-11-24 NOTE — Telephone Encounter (Signed)
Called patient to schedule f/u appointments. Patient notified.

## 2022-11-25 ENCOUNTER — Telehealth: Payer: Self-pay

## 2022-11-25 ENCOUNTER — Other Ambulatory Visit: Payer: Self-pay

## 2022-11-25 DIAGNOSIS — C8233 Follicular lymphoma grade IIIa, intra-abdominal lymph nodes: Secondary | ICD-10-CM

## 2022-11-25 NOTE — Telephone Encounter (Signed)
Called patient to advice that his insurance requires that he have his CT scan at [place]. Provided patient the contact information to schedule his CT AP.   Called central scheduling to cancel his CT AP at [facility].

## 2022-11-26 ENCOUNTER — Other Ambulatory Visit: Payer: Self-pay

## 2022-11-28 ENCOUNTER — Other Ambulatory Visit: Payer: Self-pay

## 2022-11-30 ENCOUNTER — Ambulatory Visit (HOSPITAL_COMMUNITY): Payer: Medicare Other

## 2022-12-16 ENCOUNTER — Ambulatory Visit
Admission: RE | Admit: 2022-12-16 | Discharge: 2022-12-16 | Disposition: A | Discharge status: Home / Self Care | Payer: Medicare Other | Source: Ambulatory Visit | Attending: Hematology and Oncology | Admitting: Hematology and Oncology

## 2022-12-16 ENCOUNTER — Telehealth: Payer: Self-pay

## 2022-12-16 DIAGNOSIS — J9811 Atelectasis: Secondary | ICD-10-CM | POA: Diagnosis not present

## 2022-12-16 DIAGNOSIS — C8233 Follicular lymphoma grade IIIa, intra-abdominal lymph nodes: Secondary | ICD-10-CM

## 2022-12-16 DIAGNOSIS — I712 Thoracic aortic aneurysm, without rupture, unspecified: Secondary | ICD-10-CM | POA: Diagnosis not present

## 2022-12-16 DIAGNOSIS — J984 Other disorders of lung: Secondary | ICD-10-CM | POA: Diagnosis not present

## 2022-12-16 DIAGNOSIS — K4090 Unilateral inguinal hernia, without obstruction or gangrene, not specified as recurrent: Secondary | ICD-10-CM | POA: Diagnosis not present

## 2022-12-16 DIAGNOSIS — I359 Nonrheumatic aortic valve disorder, unspecified: Secondary | ICD-10-CM | POA: Diagnosis not present

## 2022-12-16 DIAGNOSIS — C8290 Follicular lymphoma, unspecified, unspecified site: Secondary | ICD-10-CM | POA: Diagnosis not present

## 2022-12-16 MED ORDER — IOPAMIDOL (ISOVUE-300) INJECTION 61%
100.0000 mL | Freq: Once | INTRAVENOUS | Status: AC | PRN
  Administered 2022-12-16: 100 mL via INTRAVENOUS

## 2022-12-16 NOTE — Telephone Encounter (Addendum)
Advised of message below. Reviewed upcoming appointments.  ----- Message from Orson Slick, MD sent at 12/16/2022  1:33 PM EST ----- Please let Mr. Ungaro know that his CT scan looks excellent. Most lymph nodes shrank by >50%. Overall this was a good response to therapy. We will see him back for labs later this month and a clinic visit in April 2024.   ----- Message ----- From: Buel Ream, Rad Results In Sent: 12/16/2022   1:29 PM EST To: Orson Slick, MD

## 2023-01-05 ENCOUNTER — Other Ambulatory Visit: Payer: Self-pay

## 2023-01-05 ENCOUNTER — Inpatient Hospital Stay: Payer: Medicare Other | Attending: Hematology and Oncology

## 2023-01-05 DIAGNOSIS — Z8572 Personal history of non-Hodgkin lymphomas: Secondary | ICD-10-CM | POA: Diagnosis not present

## 2023-01-05 DIAGNOSIS — Z452 Encounter for adjustment and management of vascular access device: Secondary | ICD-10-CM | POA: Diagnosis not present

## 2023-01-05 DIAGNOSIS — C8233 Follicular lymphoma grade IIIa, intra-abdominal lymph nodes: Secondary | ICD-10-CM

## 2023-01-05 DIAGNOSIS — Z95828 Presence of other vascular implants and grafts: Secondary | ICD-10-CM

## 2023-01-05 MED ORDER — SODIUM CHLORIDE 0.9% FLUSH
10.0000 mL | Freq: Once | INTRAVENOUS | Status: AC
  Administered 2023-01-05: 10 mL

## 2023-01-05 MED ORDER — HEPARIN SOD (PORK) LOCK FLUSH 100 UNIT/ML IV SOLN
500.0000 [IU] | Freq: Once | INTRAVENOUS | Status: AC
  Administered 2023-01-05: 500 [IU]

## 2023-01-19 DIAGNOSIS — Z86010 Personal history of colonic polyps: Secondary | ICD-10-CM | POA: Diagnosis not present

## 2023-01-19 DIAGNOSIS — E782 Mixed hyperlipidemia: Secondary | ICD-10-CM | POA: Diagnosis not present

## 2023-01-19 DIAGNOSIS — C8518 Unspecified B-cell lymphoma, lymph nodes of multiple sites: Secondary | ICD-10-CM | POA: Diagnosis not present

## 2023-02-15 NOTE — Progress Notes (Unsigned)
[facility] [phone]   [phone]  PROGRESS NOTE  Patient Care Team: Jaci Standard, MD as PCP - General (Hematology and Oncology)  Hematological/Oncological History # Follicular Lymphoma. Stage III 1) 01/07/2020: CT Neck W contrast showed cervical lymphadenopathy bilaterally, left greater than right. Numerous enlarged lymph nodes are present 2)  01/22/2020: [place] Core biopsy performed which revealed overall features consistent with involvement by non-Hodgkin's B-cell lymphoma and the morphologic and phenotypic findings favor follicular lymphoma.  3) 01/31/2020: establish care with Dr. Leonides Schanz  4) 02/12/2020: PET CT scan demonstrates bulky adenopathy in the neck, chest, abdomen and pelvis and diffuse skeletal uptake is suspicious for (but not diagnostic) of marrow involvement. 5) 04/30/2020: Cycle 1 Day 1 of Bendamustine/Rituximab 6) 05/29/2020: Cycle 2 Day 1 of Bendamustine/Rituximab 7) 06/25/2020: Cycle 3 Day 1 of Bendamustine/Rituximab 8) 07/23/2020: Cycle 4 Day 1 of Bendamustine/Rituximab 9) 08/25/2020: Cycle 5 Day 1 of Bendamustine/Rituximab 10)09/17/2020: Cycle 6 Day 1 of Bendamustine/Rituximab 11) 10/26/2020: PET CT scan shows partial response (Deauville Score 3 in lymph nodes but some residual FDG avidity in the bone marrow).  12) 04/27/2021: CT scan showed interval decreased adenopathy below the diaphragm, consistent with treatment response. No new or enlarging suspicious lymph nodes 13) 10/27/2021: CT scan showed increased left inguinal adenopathy, including a 1.6 cm left inguinal lymph node which was previously 0.9 cm in short axis. 14) 04/30/2022: CT scan shows progressive adenopathy below the diaphragm with new thoracic adenopathy and increased hazy retroperitoneal stranding, consistent with worsening lymphomatous disease. 15) 05/05/2022: Doppler US of lower extremities revealed bilateral lymph nodes in the groin.   16) 06/10/2022: Echo: EF 60-65% 17)  06/29/2022: Cycle 1 of R-CHOP 18) 07/26/2022: Cycle 2 of R-CHOP 19) 08/17/2022: Cycle 3 of R-CHOP 20) 09/07/2022: Cycle 4 of R-CHOP 21) 09/29/2022: Cycle 5 of R-CHOP 22) 10/27/2022: Cycle 6 of R-CHOP  Interval History:  Jorge Gould 67 y.o. male with medical history significant for Stage III follicular lymphoma who presents for a follow up visit. The patient's last visit was on 11/23/2022. In the interim since the last visit he has purchased a new home.  On exam today Jorge Gould reports he has been well overall in the interim since her last visit.  He reports that he has discovered a lump in his left groin which he is concerned about.  He reports otherwise he has been feeling okay.  He recently bought a new home in [place].  He reports that the lump in the groin was something he just noticed this morning and that it is "not huge, only prominent".  He notes that it is not causing any pain or discomfort.  He reports he also had a scrotal cyst at 1 point which "busted" and drained.  It is subsequently resolved.  He has lost 5 pounds in the interim since her last visit but that was unintentional.  He continues to work 7 days a week and unfortunate has been doing this for 7 straight months.  He is not having any nausea, vomiting, diarrhea, lightheadedness, dizziness, or shortness of breath.  He denies any fevers, chills, sweats.  Overall he has no signs or symptoms concerning for recurrence of his lymphoma.  A full 10 point ROS was otherwise negative.    MEDICAL HISTORY:  Past Medical History:  Diagnosis Date   Cancer (HCC) 2021   follicular lymphoma   Obesity     SURGICAL HISTORY: Past Surgical History:  Procedure Laterality Date  ANKLE FRACTURE SURGERY  2016   right, trauma repair after fall down stairs   HERNIA REPAIR     umbilical   IR IMAGING GUIDED PORT INSERTION  04/09/2020   IR IMAGING GUIDED PORT INSERTION  06/17/2022   IR REMOVAL TUN ACCESS W/ PORT W/O FL MOD SED  11/27/2020    TONSILLECTOMY      SOCIAL HISTORY: Social History   Socioeconomic History   Marital status: Married    Spouse name: Not on file   Number of children: Not on file   Years of education: Not on file   Highest education level: Not on file  Occupational History   Not on file  Tobacco Use   Smoking status: Never   Smokeless tobacco: Never  Vaping Use   Vaping Use: Never used  Substance and Sexual Activity   Alcohol use: Yes    Alcohol/week: 3.0 standard drinks of alcohol    Types: 3 Shots of liquor per week   Drug use: Not Currently   Sexual activity: Not on file  Other Topics Concern   Not on file  Social History Narrative   Engaged, plan to marry in March 2021, been together since 2014.  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    [employer].   Most exercise on the job, working 7 days per week.   No children, but fiance has 5 children.    11/2019   Social Determinants of Health   Financial Resource Strain: Not on file  Food Insecurity: Not on file  Transportation Needs: Not on file  Physical Activity: Not on file  Stress: Not on file  Social Connections: Not on file  Intimate Partner Violence: Not on file    FAMILY HISTORY: Family History  Problem Relation Age of Onset   Cancer Mother        lung; former smoker   COPD Father        emphysema   Diabetes Maternal Aunt    Heart disease Maternal Uncle    Heart disease Maternal Uncle    Diabetes Maternal Aunt    Stroke Neg Hx    Hyperlipidemia Neg Hx    Hypertension Neg Hx     ALLERGIES:  has No Known Allergies.  MEDICATIONS:  Current Outpatient Medications  Medication Sig Dispense Refill   acetaminophen (TYLENOL) 500 MG tablet Take 2 tablets (1,000 mg total) by mouth every 6 (six) hours as needed. 30 tablet 0   amLODipine (NORVASC) 5 MG tablet Take 1 tablet (5 mg total) by mouth daily. 30 tablet 3   No current facility-administered medications for this visit.   Facility-Administered Medications Ordered in  Other Visits  Medication Dose Route Frequency Provider Last Rate Last Admin   sodium chloride flush (NS) 0.9 % injection 10 mL  10 mL Intracatheter PRN Jaci Standardorsey, Latha Staunton T IV, MD   10 mL at 10/27/22 1750    REVIEW OF SYSTEMS:   Constitutional: ( - ) fevers, ( - )  chills , ( - ) night sweats Eyes: ( - ) blurriness of vision, ( - ) double vision, ( - ) watery eyes Ears, nose, mouth, throat, and face: ( - ) mucositis, ( - ) sore throat Respiratory: ( - ) cough, ( - ) dyspnea, ( - ) wheezes Cardiovascular: ( - ) palpitation, ( - ) chest discomfort, ( - ) lower extremity swelling Gastrointestinal:  ( - ) nausea, ( - ) heartburn, ( - ) change in bowel habits Skin: ( - )  abnormal skin rashes Lymphatics: ( - ) new lymphadenopathy, ( - ) easy bruising Neurological: ( - ) numbness, ( - ) tingling, ( - ) new weaknesses Behavioral/Psych: ( - ) mood change, ( - ) new changes  All other systems were reviewed with the patient and are negative.  PHYSICAL EXAMINATION: ECOG PERFORMANCE STATUS: 1 - Symptomatic but completely ambulatory  Vitals:   02/16/23 1048  BP: (!) 147/99  Pulse: (!) 54  Resp: 17  Temp: 97.7 F (36.5 C)  SpO2: 100%      Filed Weights   02/16/23 1048  Weight: 232 lb 9.6 oz (105.5 kg)    GENERAL: well appearing middle aged Caucasian male in NAD  SKIN: skin color, texture, turgor are normal, no rashes or significant lesions EYES: conjunctiva are pink and non-injected, sclera clear LUNGS: clear to auscultation and percussion with normal breathing effort HEART: regular rate & rhythm and no murmurs and +1 pitting edema in lower extremity bilaterally.  Musculoskeletal: no cyanosis of digits and no clubbing  PSYCH: alert & oriented x 3, fluent speech NEURO: no focal motor/sensory deficits  LABORATORY DATA:  I have reviewed the data as listed    Latest Ref Rng & Units 02/16/2023   10:35 AM 11/23/2022    9:25 AM 10/27/2022   12:30 PM  CBC  WBC 4.0 - 10.5 K/uL 4.8  4.6  4.8    Hemoglobin 13.0 - 17.0 g/dL 73.4  28.7  68.1   Hematocrit 39.0 - 52.0 % 40.1  34.5  34.1   Platelets 150 - 400 K/uL 236  188  228        Latest Ref Rng & Units 02/16/2023   10:35 AM 11/23/2022    9:25 AM 10/27/2022   12:30 PM  CMP  Glucose 70 - 99 mg/dL 92  157  262   BUN 8 - 23 mg/dL 12  16  16    Creatinine 0.61 - 1.24 mg/dL 0.35  5.97  4.16   Sodium 135 - 145 mmol/L 142  141  138   Potassium 3.5 - 5.1 mmol/L 3.9  3.9  3.7   Chloride 98 - 111 mmol/L 106  107  105   CO2 22 - 32 mmol/L 30  26  29    Calcium 8.9 - 10.3 mg/dL 9.6  9.1  9.4   Total Protein 6.5 - 8.1 g/dL 7.1  6.2  6.6   Total Bilirubin 0.3 - 1.2 mg/dL 0.5  0.4  0.5   Alkaline Phos 38 - 126 U/L 85  72  69   AST 15 - 41 U/L 24  13  21    ALT 0 - 44 U/L 22  13  17     RADIOGRAPHIC STUDIES: I have personally reviewed the radiological images as listed and agreed with the findings in the report: Marked response in the patient's lymphadenopathy and decrease in his FDG avidity.  Bone marrow involvement also appears considerably less.  Overall consistent with a partial response.  No results found.  ASSESSMENT & PLAN Jorge Gould 67 y.o. male with medical history significant for diagnosed follicular lymphoma who presents for a follow up visit.   He completed 6 cycles of Bendamustine/Rituximab therapy from 04/30/2020-09/17/2020. Repeat PET CT scan on 10/26/2020 showed a partial response. OK to enter observation.   CT scan from 04/30/2022 and doppler US from 05/05/2022 showed enlarging adenopathy. Recommended second line therapy with R-CHOP. Regimen consists of Rituximab 375 mg/m2, cytoxan 750 mg/m2, vincristine 2 mg IV, adriamycin  50 mg/m2 along with prednisone 60 mg PO on days 1-5. This regimen is given every 21 days.   # Follicular Lymphoma. Stage III --patient completed Cycle 6 of R-Benda therapy. This was administered with curative intent.  --PET CT scan after 6 cycles of R-Benda showed substantial partial response. Residual  mild hypermetabolism within the retroperitoneal and left common iliac chains (Deauville score 3). No residual metabolically active adenopathy in the neck or chest --per [place] Criteria, patient is a Partial Response.  --CT neck and CAP from 04/30/2022 showed progressive adenopathy.  --Doppler  US from 05/05/2022 showed progressive inguinal adenopathy, bilateral. --Recommend systemic therapy with R-CHOP started on 06/29/2022 ----Baseline ECHO from 06/10/2022 shows EF 60-65% Plan:  --patient completed 6 cycles of R-CHOP --Labs from today were reviewed and show white blood cell 4.8, hemoglobin 14.3, MCV 90.9, and platelets of 236.  LFTs and creatinine are within normal limits. -- Patient will be due for a CT scan in August 2024. Last scan in Feb 2024 showed response to disease.  --RTC in 3 weeks for labs and f/u visit   #Left Groin Lesion -- Notably deep on exam, may be consistent with cyst versus prominent lymph node.  Does not feel enlarged. -- Continue to monitor and special attention on next CT scan.  # Left Lower Leg Swelling, improved --Patient has no more redness and +1 pitting edema of left lower extremity. Swelling L>R --Doppler US from 05/05/2022 showed no evidence of DVT. There are enlarged lymph nodes noted in the groin, bilaterally which is likely causing the swelling.   #Supportive Care -- port placed -- zofran 8mg  q8H PRN and compazine 10mg  PO q6H for nausea -- EMLA cream for port -- no pain medication required at this time.   Orders Placed This Encounter  Procedures   CT CHEST ABDOMEN PELVIS W CONTRAST    Standing Status:   Future    Standing Expiration Date:   02/16/2024    Order Specific Question:   If indicated for the ordered procedure, I authorize the administration of contrast media per Radiology protocol    Answer:   Yes    Order Specific Question:   Does the patient have a contrast media/X-ray dye allergy?    Answer:   No    Order Specific Question:   Preferred  imaging location?    Answer:   [facility]    Order Specific Question:   Is Oral Contrast requested for this exam?    Answer:   Yes, Per Radiology protocol    All questions were answered. The patient knows to call the clinic with any problems, questions or concerns.  I have spent a total of 30 minutes minutes of face-to-face and non-face-to-face time, preparing to see the patient,  performing a medically appropriate examination, counseling and educating the patient, ordering medications, documenting clinical information in the electronic health record, and care coordination.   Ulysees Barns, MD Department of Hematology/Oncology [facility] at [facility] Phone: [phone] Pager: [phone] Email: [email or web address]  02/16/2023 3:42 PM

## 2023-02-16 ENCOUNTER — Inpatient Hospital Stay: Payer: Medicare Other

## 2023-02-16 ENCOUNTER — Other Ambulatory Visit: Payer: Self-pay | Admitting: *Deleted

## 2023-02-16 ENCOUNTER — Telehealth: Payer: Self-pay | Admitting: Hematology and Oncology

## 2023-02-16 ENCOUNTER — Inpatient Hospital Stay: Payer: Medicare Other | Attending: Hematology and Oncology | Admitting: Hematology and Oncology

## 2023-02-16 ENCOUNTER — Other Ambulatory Visit: Payer: Self-pay | Admitting: Hematology and Oncology

## 2023-02-16 ENCOUNTER — Other Ambulatory Visit: Payer: Self-pay

## 2023-02-16 VITALS — BP 147/99 | HR 54 | Temp 97.7°F | Resp 17 | Wt 232.6 lb

## 2023-02-16 DIAGNOSIS — Z95828 Presence of other vascular implants and grafts: Secondary | ICD-10-CM | POA: Diagnosis not present

## 2023-02-16 DIAGNOSIS — C8233 Follicular lymphoma grade IIIa, intra-abdominal lymph nodes: Secondary | ICD-10-CM

## 2023-02-16 DIAGNOSIS — R1909 Other intra-abdominal and pelvic swelling, mass and lump: Secondary | ICD-10-CM | POA: Insufficient documentation

## 2023-02-16 DIAGNOSIS — M7989 Other specified soft tissue disorders: Secondary | ICD-10-CM | POA: Diagnosis not present

## 2023-02-16 DIAGNOSIS — Z801 Family history of malignant neoplasm of trachea, bronchus and lung: Secondary | ICD-10-CM | POA: Diagnosis not present

## 2023-02-16 DIAGNOSIS — C8228 Follicular lymphoma grade III, unspecified, lymph nodes of multiple sites: Secondary | ICD-10-CM | POA: Diagnosis not present

## 2023-02-16 LAB — CBC WITH DIFFERENTIAL (CANCER CENTER ONLY)
Abs Immature Granulocytes: 0.08 10*3/uL — ABNORMAL HIGH (ref 0.00–0.07)
Basophils Absolute: 0 10*3/uL (ref 0.0–0.1)
Basophils Relative: 1 %
Eosinophils Absolute: 0.1 10*3/uL (ref 0.0–0.5)
Eosinophils Relative: 3 %
HCT: 40.1 % (ref 39.0–52.0)
Hemoglobin: 14.3 g/dL (ref 13.0–17.0)
Immature Granulocytes: 2 %
Lymphocytes Relative: 8 %
Lymphs Abs: 0.4 10*3/uL — ABNORMAL LOW (ref 0.7–4.0)
MCH: 32.4 pg (ref 26.0–34.0)
MCHC: 35.7 g/dL (ref 30.0–36.0)
MCV: 90.9 fL (ref 80.0–100.0)
Monocytes Absolute: 0.5 10*3/uL (ref 0.1–1.0)
Monocytes Relative: 9 %
Neutro Abs: 3.8 10*3/uL (ref 1.7–7.7)
Neutrophils Relative %: 77 %
Platelet Count: 236 10*3/uL (ref 150–400)
RBC: 4.41 MIL/uL (ref 4.22–5.81)
RDW: 11.9 % (ref 11.5–15.5)
WBC Count: 4.8 10*3/uL (ref 4.0–10.5)
nRBC: 0 % (ref 0.0–0.2)

## 2023-02-16 LAB — CMP (CANCER CENTER ONLY)
ALT: 22 U/L (ref 0–44)
AST: 24 U/L (ref 15–41)
Albumin: 4.3 g/dL (ref 3.5–5.0)
Alkaline Phosphatase: 85 U/L (ref 38–126)
Anion gap: 6 (ref 5–15)
BUN: 12 mg/dL (ref 8–23)
CO2: 30 mmol/L (ref 22–32)
Calcium: 9.6 mg/dL (ref 8.9–10.3)
Chloride: 106 mmol/L (ref 98–111)
Creatinine: 0.86 mg/dL (ref 0.61–1.24)
GFR, Estimated: >60 mL/min (ref >60)
Glucose, Bld: 92 mg/dL (ref 70–99)
Potassium: 3.9 mmol/L (ref 3.5–5.1)
Sodium: 142 mmol/L (ref 135–145)
Total Bilirubin: 0.5 mg/dL (ref 0.3–1.2)
Total Protein: 7.1 g/dL (ref 6.5–8.1)

## 2023-02-16 LAB — LACTATE DEHYDROGENASE: LDH: 187 U/L (ref 98–192)

## 2023-02-16 MED ORDER — SODIUM CHLORIDE 0.9% FLUSH
10.0000 mL | Freq: Once | INTRAVENOUS | Status: AC
  Administered 2023-02-16: 10 mL

## 2023-02-16 MED ORDER — AMLODIPINE BESYLATE 5 MG PO TABS: 5.0000 mg | ORAL_TABLET | Freq: Every day | ORAL | 3 refills | Status: DC

## 2023-02-16 MED ORDER — HEPARIN SOD (PORK) LOCK FLUSH 100 UNIT/ML IV SOLN
500.0000 [IU] | Freq: Once | INTRAVENOUS | Status: AC
  Administered 2023-02-16: 500 [IU]

## 2023-02-16 NOTE — Telephone Encounter (Signed)
Reached out to patient to schedule per 4/10 LOS, patient aware of date and time of appointment.

## 2023-02-18 ENCOUNTER — Other Ambulatory Visit: Payer: Self-pay

## 2023-03-08 DIAGNOSIS — K5730 Diverticulosis of large intestine without perforation or abscess without bleeding: Secondary | ICD-10-CM | POA: Diagnosis not present

## 2023-03-08 DIAGNOSIS — K635 Polyp of colon: Secondary | ICD-10-CM | POA: Diagnosis not present

## 2023-03-08 DIAGNOSIS — Z86010 Personal history of colonic polyps: Secondary | ICD-10-CM | POA: Diagnosis not present

## 2023-03-08 DIAGNOSIS — D123 Benign neoplasm of transverse colon: Secondary | ICD-10-CM | POA: Diagnosis not present

## 2023-03-08 DIAGNOSIS — D3A8 Other benign neuroendocrine tumors: Secondary | ICD-10-CM | POA: Diagnosis not present

## 2023-03-21 ENCOUNTER — Encounter: Payer: Self-pay | Admitting: Hematology and Oncology

## 2023-03-24 ENCOUNTER — Telehealth: Payer: Self-pay | Admitting: *Deleted

## 2023-03-24 NOTE — Telephone Encounter (Signed)
TCT patient regarding pathology report from pt's GI doctor and having him come in to speak with Dr. Leonides Schanz about the path report. Pt states he has spoken with his GI doctor and is aware of the results. Advised that it is a different type of tumor than his lymphoma and grows very slowly. Advised that Dr. Leonides Schanz will go over the details at his next visit. This visit is scheduled for 03/29/23 @ 12noon for labs and 12:30 with Dr. Leonides Schanz. Pt voiced understanding.

## 2023-03-29 ENCOUNTER — Inpatient Hospital Stay: Payer: Medicare Other | Attending: Hematology and Oncology | Admitting: Hematology and Oncology

## 2023-03-29 ENCOUNTER — Other Ambulatory Visit: Payer: Self-pay | Admitting: Hematology and Oncology

## 2023-03-29 ENCOUNTER — Other Ambulatory Visit: Payer: Self-pay

## 2023-03-29 ENCOUNTER — Inpatient Hospital Stay: Payer: Medicare Other

## 2023-03-29 VITALS — BP 157/93 | HR 58 | Temp 98.1°F | Resp 16 | Wt 236.6 lb

## 2023-03-29 DIAGNOSIS — C8233 Follicular lymphoma grade IIIa, intra-abdominal lymph nodes: Secondary | ICD-10-CM

## 2023-03-29 DIAGNOSIS — Z79899 Other long term (current) drug therapy: Secondary | ICD-10-CM | POA: Insufficient documentation

## 2023-03-29 DIAGNOSIS — C8228 Follicular lymphoma grade III, unspecified, lymph nodes of multiple sites: Secondary | ICD-10-CM | POA: Insufficient documentation

## 2023-03-29 DIAGNOSIS — D3A8 Other benign neuroendocrine tumors: Secondary | ICD-10-CM

## 2023-03-29 DIAGNOSIS — Z95828 Presence of other vascular implants and grafts: Secondary | ICD-10-CM | POA: Diagnosis not present

## 2023-03-29 DIAGNOSIS — R591 Generalized enlarged lymph nodes: Secondary | ICD-10-CM

## 2023-03-29 LAB — CBC WITH DIFFERENTIAL (CANCER CENTER ONLY)
Abs Immature Granulocytes: 0.03 10*3/uL (ref 0.00–0.07)
Basophils Absolute: 0 10*3/uL (ref 0.0–0.1)
Basophils Relative: 1 %
Eosinophils Absolute: 0.2 10*3/uL (ref 0.0–0.5)
Eosinophils Relative: 3 %
HCT: 39.2 % (ref 39.0–52.0)
Hemoglobin: 14.2 g/dL (ref 13.0–17.0)
Immature Granulocytes: 0 %
Lymphocytes Relative: 9 %
Lymphs Abs: 0.6 10*3/uL — ABNORMAL LOW (ref 0.7–4.0)
MCH: 32.9 pg (ref 26.0–34.0)
MCHC: 36.2 g/dL — ABNORMAL HIGH (ref 30.0–36.0)
MCV: 91 fL (ref 80.0–100.0)
Monocytes Absolute: 0.6 10*3/uL (ref 0.1–1.0)
Monocytes Relative: 8 %
Neutro Abs: 5.4 10*3/uL (ref 1.7–7.7)
Neutrophils Relative %: 79 %
Platelet Count: 218 10*3/uL (ref 150–400)
RBC: 4.31 MIL/uL (ref 4.22–5.81)
RDW: 13.5 % (ref 11.5–15.5)
WBC Count: 6.8 10*3/uL (ref 4.0–10.5)
nRBC: 0 % (ref 0.0–0.2)

## 2023-03-29 LAB — CMP (CANCER CENTER ONLY)
ALT: 13 U/L (ref 0–44)
AST: 14 U/L — ABNORMAL LOW (ref 15–41)
Albumin: 4.3 g/dL (ref 3.5–5.0)
Alkaline Phosphatase: 70 U/L (ref 38–126)
Anion gap: 8 (ref 5–15)
BUN: 19 mg/dL (ref 8–23)
CO2: 26 mmol/L (ref 22–32)
Calcium: 9.2 mg/dL (ref 8.9–10.3)
Chloride: 104 mmol/L (ref 98–111)
Creatinine: 1.02 mg/dL (ref 0.61–1.24)
GFR, Estimated: >60 mL/min (ref >60)
Glucose, Bld: 125 mg/dL — ABNORMAL HIGH (ref 70–99)
Potassium: 3.8 mmol/L (ref 3.5–5.1)
Sodium: 138 mmol/L (ref 135–145)
Total Bilirubin: 0.9 mg/dL (ref 0.3–1.2)
Total Protein: 6.5 g/dL (ref 6.5–8.1)

## 2023-03-29 LAB — LACTATE DEHYDROGENASE: LDH: 138 U/L (ref 98–192)

## 2023-03-29 NOTE — Progress Notes (Signed)
[facility] [phone]   [phone]  PROGRESS NOTE  Patient Care Team: Jaci Standard, MD as PCP - General (Hematology and Oncology)  Hematological/Oncological History # Follicular Lymphoma. Stage III 1) 01/07/2020: CT Neck W contrast showed cervical lymphadenopathy bilaterally, left greater than right. Numerous enlarged lymph nodes are present 2)  01/22/2020: [place] Core biopsy performed which revealed overall features consistent with involvement by non-Hodgkin's B-cell lymphoma and the morphologic and phenotypic findings favor follicular lymphoma.  3) 01/31/2020: establish care with Dr. Leonides Gould  4) 02/12/2020: PET CT scan demonstrates bulky adenopathy in the neck, chest, abdomen and pelvis and diffuse skeletal uptake is suspicious for (but not diagnostic) of marrow involvement. 5) 04/30/2020: Cycle 1 Day 1 of Bendamustine/Rituximab 6) 05/29/2020: Cycle 2 Day 1 of Bendamustine/Rituximab 7) 06/25/2020: Cycle 3 Day 1 of Bendamustine/Rituximab 8) 07/23/2020: Cycle 4 Day 1 of Bendamustine/Rituximab 9) 08/25/2020: Cycle 5 Day 1 of Bendamustine/Rituximab 10)09/17/2020: Cycle 6 Day 1 of Bendamustine/Rituximab 11) 10/26/2020: PET CT scan shows partial response (Deauville Score 3 in lymph nodes but some residual FDG avidity in the bone marrow).  12) 04/27/2021: CT scan showed interval decreased adenopathy below the diaphragm, consistent with treatment response. No new or enlarging suspicious lymph nodes 13) 10/27/2021: CT scan showed increased left inguinal adenopathy, including a 1.6 cm left inguinal lymph node which was previously 0.9 cm in short axis. 14) 04/30/2022: CT scan shows progressive adenopathy below the diaphragm with new thoracic adenopathy and increased hazy retroperitoneal stranding, consistent with worsening lymphomatous disease. 15) 05/05/2022: Doppler US of lower extremities revealed bilateral lymph nodes in the groin.   16) 06/10/2022: Echo: EF 60-65% 17)  06/29/2022: Cycle 1 of R-CHOP 18) 07/26/2022: Cycle 2 of R-CHOP 19) 08/17/2022: Cycle 3 of R-CHOP 20) 09/07/2022: Cycle 4 of R-CHOP 21) 09/29/2022: Cycle 5 of R-CHOP 22) 10/27/2022: Cycle 6 of R-CHOP  # Well Differentiated Neuroendocrine Tumor 03/08/2023: Routine colonoscopy performed and patient was found to have a well-differentiated neuroendocrine tumor in the ileum.  Tumor was 0.5 cm with a Ki-67 of less than 1%  Interval History:  Jorge Gould 67 y.o. male with medical history significant for Stage III follicular lymphoma who presents for a follow up visit. The patient's last visit was on 11/23/2022. In the interim since the last visit he has purchased a new home.  On exam today Jorge Gould reports he has been well overall interim since her last visit.  He reports he is not having any signs or symptoms concerning for GI bleeding.  He reports he underwent his colonoscopy and was told he had a "tumor with minimal slow growth".  He reports that his energy levels have been good and he is not having any nausea, vomiting, or diarrhea.  He reports he unloaded 30 bags of 60 pounds concrete today.  He denies any fevers, chills, sweats.  Overall he has no signs or symptoms concerning for recurrence of his lymphoma.  A full 10 point ROS was otherwise negative.   The bulk of our discussion focused on his new diagnosis of well-differentiated neuroendocrine tumor found on routine colonoscopy.   MEDICAL HISTORY:  Past Medical History:  Diagnosis Date   Cancer (HCC) 2021   follicular lymphoma   Obesity     SURGICAL HISTORY: Past Surgical History:  Procedure Laterality Date   ANKLE FRACTURE SURGERY  2016   right, trauma repair after fall down stairs   HERNIA REPAIR     umbilical   IR IMAGING GUIDED  PORT INSERTION  04/09/2020   IR IMAGING GUIDED PORT INSERTION  06/17/2022   IR REMOVAL TUN ACCESS W/ PORT W/O FL MOD SED  11/27/2020   TONSILLECTOMY      SOCIAL HISTORY: Social History    Socioeconomic History   Marital status: Married    Spouse name: Not on file   Number of children: Not on file   Years of education: Not on file   Highest education level: Not on file  Occupational History   Not on file  Tobacco Use   Smoking status: Never   Smokeless tobacco: Never  Vaping Use   Vaping Use: Never used  Substance and Sexual Activity   Alcohol use: Yes    Alcohol/week: 3.0 standard drinks of alcohol    Types: 3 Shots of liquor per week   Drug use: Not Currently   Sexual activity: Not on file  Other Topics Concern   Not on file  Social History Narrative   Engaged, plan to marry in March 2021, been together since 2014.  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    [employer].   Most exercise on the job, working 7 days per week.   No children, but fiance has 5 children.    11/2019   Social Determinants of Health   Financial Resource Strain: Not on file  Food Insecurity: Not on file  Transportation Needs: Not on file  Physical Activity: Not on file  Stress: Not on file  Social Connections: Not on file  Intimate Partner Violence: Not on file    FAMILY HISTORY: Family History  Problem Relation Age of Onset   Cancer Mother        lung; former smoker   COPD Father        emphysema   Diabetes Maternal Aunt    Heart disease Maternal Uncle    Heart disease Maternal Uncle    Diabetes Maternal Aunt    Stroke Neg Hx    Hyperlipidemia Neg Hx    Hypertension Neg Hx     ALLERGIES:  has No Known Allergies.  MEDICATIONS:  Current Outpatient Medications  Medication Sig Dispense Refill   acetaminophen (TYLENOL) 500 MG tablet Take 2 tablets (1,000 mg total) by mouth every 6 (six) hours as needed. 30 tablet 0   amLODipine (NORVASC) 5 MG tablet Take 1 tablet (5 mg total) by mouth daily. 30 tablet 3   No current facility-administered medications for this visit.   Facility-Administered Medications Ordered in Other Visits  Medication Dose Route Frequency Provider  Last Rate Last Admin   sodium chloride flush (NS) 0.9 % injection 10 mL  10 mL Intracatheter PRN Jaci Standard, MD   10 mL at 10/27/22 1750    REVIEW OF SYSTEMS:   Constitutional: ( - ) fevers, ( - )  chills , ( - ) night sweats Eyes: ( - ) blurriness of vision, ( - ) double vision, ( - ) watery eyes Ears, nose, mouth, throat, and face: ( - ) mucositis, ( - ) sore throat Respiratory: ( - ) cough, ( - ) dyspnea, ( - ) wheezes Cardiovascular: ( - ) palpitation, ( - ) chest discomfort, ( - ) lower extremity swelling Gastrointestinal:  ( - ) nausea, ( - ) heartburn, ( - ) change in bowel habits Skin: ( - ) abnormal skin rashes Lymphatics: ( - ) new lymphadenopathy, ( - ) easy bruising Neurological: ( - ) numbness, ( - ) tingling, ( - ) new  weaknesses Behavioral/Psych: ( - ) mood change, ( - ) new changes  All other systems were reviewed with the patient and are negative.  PHYSICAL EXAMINATION: ECOG PERFORMANCE STATUS: 1 - Symptomatic but completely ambulatory  There were no vitals filed for this visit. There were no vitals filed for this visit.  GENERAL: well appearing middle aged Caucasian male in NAD  SKIN: skin color, texture, turgor are normal, no rashes or significant lesions EYES: conjunctiva are pink and non-injected, sclera clear LUNGS: clear to auscultation and percussion with normal breathing effort HEART: regular rate & rhythm and no murmurs and +1 pitting edema in lower extremity bilaterally.  Musculoskeletal: no cyanosis of digits and no clubbing  PSYCH: alert & oriented x 3, fluent speech NEURO: no focal motor/sensory deficits  LABORATORY DATA:  I have reviewed the data as listed    Latest Ref Rng & Units 02/16/2023   10:35 AM 11/23/2022    9:25 AM 10/27/2022   12:30 PM  CBC  WBC 4.0 - 10.5 K/uL 4.8  4.6  4.8   Hemoglobin 13.0 - 17.0 g/dL 40.9  81.1  91.4   Hematocrit 39.0 - 52.0 % 40.1  34.5  34.1   Platelets 150 - 400 K/uL 236  188  228        Latest Ref  Rng & Units 02/16/2023   10:35 AM 11/23/2022    9:25 AM 10/27/2022   12:30 PM  CMP  Glucose 70 - 99 mg/dL 92  782  956   BUN 8 - 23 mg/dL 12  16  16    Creatinine 0.61 - 1.24 mg/dL 2.13  0.86  5.78   Sodium 135 - 145 mmol/L 142  141  138   Potassium 3.5 - 5.1 mmol/L 3.9  3.9  3.7   Chloride 98 - 111 mmol/L 106  107  105   CO2 22 - 32 mmol/L 30  26  29    Calcium 8.9 - 10.3 mg/dL 9.6  9.1  9.4   Total Protein 6.5 - 8.1 g/dL 7.1  6.2  6.6   Total Bilirubin 0.3 - 1.2 mg/dL 0.5  0.4  0.5   Alkaline Phos 38 - 126 U/L 85  72  69   AST 15 - 41 U/L 24  13  21    ALT 0 - 44 U/L 22  13  17     RADIOGRAPHIC STUDIES: I have personally reviewed the radiological images as listed and agreed with the findings in the report: Marked response in the patient's lymphadenopathy and decrease in his FDG avidity.  Bone marrow involvement also appears considerably less.  Overall consistent with a partial response.  No results found.  ASSESSMENT & PLAN Jorge Gould 67 y.o. male with medical history significant for diagnosed follicular lymphoma who presents for a follow up visit.   He completed 6 cycles of Bendamustine/Rituximab therapy from 04/30/2020-09/17/2020. Repeat PET CT scan on 10/26/2020 showed a partial response. OK to enter observation.   CT scan from 04/30/2022 and doppler US from 05/05/2022 showed enlarging adenopathy. Recommended second line therapy with R-CHOP. Regimen consists of Rituximab 375 mg/m2, cytoxan 750 mg/m2, vincristine 2 mg IV, adriamycin 50 mg/m2 along with prednisone 60 mg PO on days 1-5. This regimen is given every 21 days.   # Follicular Lymphoma. Stage III --patient completed Cycle 6 of R-Benda therapy. This was administered with curative intent.  --PET CT scan after 6 cycles of R-Benda showed substantial partial response. Residual mild hypermetabolism within the retroperitoneal  and left common iliac chains (Deauville score 3). No residual metabolically active adenopathy in the neck  or chest --per [place] Criteria, patient is a Partial Response.  --CT neck and CAP from 04/30/2022 showed progressive adenopathy.  --Doppler  US from 05/05/2022 showed progressive inguinal adenopathy, bilateral. --Recommend systemic therapy with R-CHOP started on 06/29/2022 ----Baseline ECHO from 06/10/2022 shows EF 60-65% Plan:  --patient completed 6 cycles of R-CHOP --Labs from today were reviewed and show white blood cell 6.8, hemoglobin 14.2, MCV 91, and platelets of 218.  LFTs and creatinine are within normal limits. -- Patient will be due for a CT scan in August 2024. Last scan in Feb 2024 showed response to disease.  --RTC as scheduled in August 2024.   #Left Groin Lesion -- Notably deep on exam, may be consistent with cyst versus prominent lymph node.  Does not feel enlarged. -- Continue to monitor and special attention on next CT scan.  # Left Lower Leg Swelling, improved --Patient has no more redness and +1 pitting edema of left lower extremity. Swelling L>R --Doppler US from 05/05/2022 showed no evidence of DVT. There are enlarged lymph nodes noted in the groin, bilaterally which is likely causing the swelling.   #Supportive Care -- port placed -- zofran 8mg  q8H PRN and compazine 10mg  PO q6H for nausea -- EMLA cream for port -- no pain medication required at this time.   No orders of the defined types were placed in this encounter.   All questions were answered. The patient knows to call the clinic with any problems, questions or concerns.  I have spent a total of 30 minutes minutes of face-to-face and non-face-to-face time, preparing to see the patient,  performing a medically appropriate examination, counseling and educating the patient, ordering medications, documenting clinical information in the electronic health record, and care coordination.   Ulysees Barns, MD Department of Hematology/Oncology [facility] Phone:  [phone] Pager: [phone] Email: [email or web address]  03/29/2023 7:12 AM

## 2023-03-31 LAB — CHROMOGRANIN A: Chromogranin A (ng/mL): 92 ng/mL (ref 0.0–101.8)

## 2023-04-21 ENCOUNTER — Encounter (HOSPITAL_COMMUNITY)
Admission: RE | Admit: 2023-04-21 | Discharge: 2023-04-21 | Disposition: A | Discharge status: Home / Self Care | Payer: Medicare Other | Source: Ambulatory Visit | Attending: Hematology and Oncology | Admitting: Hematology and Oncology

## 2023-04-21 DIAGNOSIS — D3A8 Other benign neuroendocrine tumors: Secondary | ICD-10-CM | POA: Diagnosis not present

## 2023-04-21 DIAGNOSIS — R918 Other nonspecific abnormal finding of lung field: Secondary | ICD-10-CM | POA: Diagnosis not present

## 2023-04-21 MED ORDER — COPPER CU 64 DOTATATE 1 MCI/ML IV SOLN
4.0000 | Freq: Once | INTRAVENOUS | Status: AC
  Administered 2023-04-21: 4.1 via INTRAVENOUS

## 2023-04-28 ENCOUNTER — Other Ambulatory Visit: Payer: Self-pay

## 2023-05-02 ENCOUNTER — Telehealth: Payer: Self-pay | Admitting: *Deleted

## 2023-05-02 NOTE — Telephone Encounter (Signed)
-----   Message from Jaci Standard, MD sent at 05/01/2023 11:26 AM EDT ----- Please let Mr. Medal know that his scan did not show any evidence of neuroendocrine tumor elsewhere in the body. It is likely that the tissue removed was the only neuroendocrine tumor. We will continue with routine monitoring of his lymphoma. Our next visit in August 2024.   ----- Message ----- From: Leory Plowman, Rad Results In Sent: 04/29/2023   3:06 PM EDT To: Jaci Standard, MD

## 2023-05-02 NOTE — Telephone Encounter (Signed)
TCT patient regarding recent scan. Spoke with him and advised that his scan did not show any evidence of neuroendocrine tumor elsewhere in the body. It is likely that the tissue removed was the only neuroendocrine tumor. We will continue with routine monitoring of his lymphoma. Our next visit in August 2024.   Pt voiced understanding.

## 2023-06-01 ENCOUNTER — Telehealth: Payer: Self-pay | Admitting: Hematology and Oncology

## 2023-06-10 ENCOUNTER — Ambulatory Visit (HOSPITAL_COMMUNITY)
Admission: RE | Admit: 2023-06-10 | Discharge: 2023-06-10 | Disposition: A | Discharge status: Home / Self Care | Payer: Medicare Other | Source: Ambulatory Visit | Attending: Hematology and Oncology | Admitting: Hematology and Oncology

## 2023-06-10 ENCOUNTER — Encounter (HOSPITAL_COMMUNITY): Payer: Self-pay

## 2023-06-10 DIAGNOSIS — C8233 Follicular lymphoma grade IIIa, intra-abdominal lymph nodes: Secondary | ICD-10-CM | POA: Insufficient documentation

## 2023-06-10 DIAGNOSIS — I700 Atherosclerosis of aorta: Secondary | ICD-10-CM | POA: Diagnosis not present

## 2023-06-10 DIAGNOSIS — I7121 Aneurysm of the ascending aorta, without rupture: Secondary | ICD-10-CM | POA: Diagnosis not present

## 2023-06-10 DIAGNOSIS — C8290 Follicular lymphoma, unspecified, unspecified site: Secondary | ICD-10-CM | POA: Diagnosis not present

## 2023-06-10 MED ORDER — HEPARIN SOD (PORK) LOCK FLUSH 100 UNIT/ML IV SOLN
500.0000 [IU] | Freq: Once | INTRAVENOUS | Status: AC
  Administered 2023-06-10: 500 [IU] via INTRAVENOUS

## 2023-06-10 MED ORDER — SODIUM CHLORIDE (PF) 0.9 % IJ SOLN
INTRAMUSCULAR | Status: AC
  Filled 2023-06-10: qty 50

## 2023-06-10 MED ORDER — IOHEXOL 9 MG/ML PO SOLN
1000.0000 mL | Freq: Once | ORAL | Status: AC
  Administered 2023-06-10: 1000 mL via ORAL

## 2023-06-10 MED ORDER — IOHEXOL 300 MG/ML  SOLN
100.0000 mL | Freq: Once | INTRAMUSCULAR | Status: AC | PRN
  Administered 2023-06-10: 100 mL via INTRAVENOUS

## 2023-06-10 MED ORDER — HEPARIN SOD (PORK) LOCK FLUSH 100 UNIT/ML IV SOLN
INTRAVENOUS | Status: AC
  Filled 2023-06-10: qty 5

## 2023-06-10 MED ORDER — IOHEXOL 9 MG/ML PO SOLN
ORAL | Status: AC
  Filled 2023-06-10: qty 1000

## 2023-06-14 ENCOUNTER — Inpatient Hospital Stay: Payer: Medicare Other | Admitting: Hematology and Oncology

## 2023-06-14 ENCOUNTER — Other Ambulatory Visit: Payer: Self-pay | Admitting: Hematology and Oncology

## 2023-06-14 ENCOUNTER — Other Ambulatory Visit: Payer: Self-pay

## 2023-06-14 ENCOUNTER — Inpatient Hospital Stay: Payer: Medicare Other | Attending: Hematology and Oncology

## 2023-06-14 VITALS — BP 168/99 | HR 57 | Temp 97.8°F | Resp 16 | Wt 239.3 lb

## 2023-06-14 DIAGNOSIS — M7989 Other specified soft tissue disorders: Secondary | ICD-10-CM | POA: Diagnosis not present

## 2023-06-14 DIAGNOSIS — C8228 Follicular lymphoma grade III, unspecified, lymph nodes of multiple sites: Secondary | ICD-10-CM | POA: Diagnosis not present

## 2023-06-14 DIAGNOSIS — C8233 Follicular lymphoma grade IIIa, intra-abdominal lymph nodes: Secondary | ICD-10-CM

## 2023-06-14 DIAGNOSIS — Z95828 Presence of other vascular implants and grafts: Secondary | ICD-10-CM | POA: Diagnosis not present

## 2023-06-14 DIAGNOSIS — F1090 Alcohol use, unspecified, uncomplicated: Secondary | ICD-10-CM | POA: Insufficient documentation

## 2023-06-14 DIAGNOSIS — C7A8 Other malignant neuroendocrine tumors: Secondary | ICD-10-CM | POA: Insufficient documentation

## 2023-06-14 LAB — CBC WITH DIFFERENTIAL (CANCER CENTER ONLY)
Abs Immature Granulocytes: 0.03 10*3/uL (ref 0.00–0.07)
Basophils Absolute: 0 10*3/uL (ref 0.0–0.1)
Basophils Relative: 1 %
Eosinophils Absolute: 0.2 10*3/uL (ref 0.0–0.5)
Eosinophils Relative: 4 %
HCT: 40.7 % (ref 39.0–52.0)
Hemoglobin: 14.4 g/dL (ref 13.0–17.0)
Immature Granulocytes: 1 %
Lymphocytes Relative: 9 %
Lymphs Abs: 0.6 10*3/uL — ABNORMAL LOW (ref 0.7–4.0)
MCH: 33.2 pg (ref 26.0–34.0)
MCHC: 35.4 g/dL (ref 30.0–36.0)
MCV: 93.8 fL (ref 80.0–100.0)
Monocytes Absolute: 0.4 10*3/uL (ref 0.1–1.0)
Monocytes Relative: 7 %
Neutro Abs: 4.8 10*3/uL (ref 1.7–7.7)
Neutrophils Relative %: 78 %
Platelet Count: 226 10*3/uL (ref 150–400)
RBC: 4.34 MIL/uL (ref 4.22–5.81)
RDW: 12.7 % (ref 11.5–15.5)
WBC Count: 6 10*3/uL (ref 4.0–10.5)
nRBC: 0 % (ref 0.0–0.2)

## 2023-06-14 LAB — CMP (CANCER CENTER ONLY)
ALT: 13 U/L (ref 0–44)
AST: 12 U/L — ABNORMAL LOW (ref 15–41)
Albumin: 4.4 g/dL (ref 3.5–5.0)
Alkaline Phosphatase: 84 U/L (ref 38–126)
Anion gap: 7 (ref 5–15)
BUN: 16 mg/dL (ref 8–23)
CO2: 28 mmol/L (ref 22–32)
Calcium: 9.3 mg/dL (ref 8.9–10.3)
Chloride: 106 mmol/L (ref 98–111)
Creatinine: 1.01 mg/dL (ref 0.61–1.24)
GFR, Estimated: >60 mL/min (ref >60)
Glucose, Bld: 100 mg/dL — ABNORMAL HIGH (ref 70–99)
Potassium: 4.3 mmol/L (ref 3.5–5.1)
Sodium: 141 mmol/L (ref 135–145)
Total Bilirubin: 0.6 mg/dL (ref 0.3–1.2)
Total Protein: 7.1 g/dL (ref 6.5–8.1)

## 2023-06-14 LAB — LACTATE DEHYDROGENASE: LDH: 154 U/L (ref 98–192)

## 2023-06-14 NOTE — Progress Notes (Signed)
[facility] [phone]   [phone]  PROGRESS NOTE  Patient Care Team: Jaci Standard, MD as PCP - General (Hematology and Oncology)  Hematological/Oncological History # Follicular Lymphoma. Stage III 1) 01/07/2020: CT Neck W contrast showed cervical lymphadenopathy bilaterally, left greater than right. Numerous enlarged lymph nodes are present 2)  01/22/2020: [place] Core biopsy performed which revealed overall features consistent with involvement by non-Hodgkin's B-cell lymphoma and the morphologic and phenotypic findings favor follicular lymphoma.  3) 01/31/2020: establish care with Dr. Leonides Schanz  4) 02/12/2020: PET CT scan demonstrates bulky adenopathy in the neck, chest, abdomen and pelvis and diffuse skeletal uptake is suspicious for (but not diagnostic) of marrow involvement. 5) 04/30/2020: Cycle 1 Day 1 of Bendamustine/Rituximab 6) 05/29/2020: Cycle 2 Day 1 of Bendamustine/Rituximab 7) 06/25/2020: Cycle 3 Day 1 of Bendamustine/Rituximab 8) 07/23/2020: Cycle 4 Day 1 of Bendamustine/Rituximab 9) 08/25/2020: Cycle 5 Day 1 of Bendamustine/Rituximab 10)09/17/2020: Cycle 6 Day 1 of Bendamustine/Rituximab 11) 10/26/2020: PET CT scan shows partial response (Deauville Score 3 in lymph nodes but some residual FDG avidity in the bone marrow).  12) 04/27/2021: CT scan showed interval decreased adenopathy below the diaphragm, consistent with treatment response. No new or enlarging suspicious lymph nodes 13) 10/27/2021: CT scan showed increased left inguinal adenopathy, including a 1.6 cm left inguinal lymph node which was previously 0.9 cm in short axis. 14) 04/30/2022: CT scan shows progressive adenopathy below the diaphragm with new thoracic adenopathy and increased hazy retroperitoneal stranding, consistent with worsening lymphomatous disease. 15) 05/05/2022: Doppler US of lower extremities revealed bilateral lymph nodes in the groin.   16) 06/10/2022: Echo: EF 60-65% 17)  06/29/2022: Cycle 1 of R-CHOP 18) 07/26/2022: Cycle 2 of R-CHOP 19) 08/17/2022: Cycle 3 of R-CHOP 20) 09/07/2022: Cycle 4 of R-CHOP 21) 09/29/2022: Cycle 5 of R-CHOP 22) 10/27/2022: Cycle 6 of R-CHOP  # Well Differentiated Neuroendocrine Tumor 03/08/2023: Routine colonoscopy performed and patient was found to have a well-differentiated neuroendocrine tumor in the ileum.  Tumor was 0.5 cm with a Ki-67 of less than 1%  Interval History:  Jorge Gould 67 y.o. male with medical history significant for Stage III follicular lymphoma who presents for a follow up visit. The patient's last visit was on 03/29/2023. In the interim since the last visit he has purchased a new home.  On exam today [name] reports he has been well overall in the [place] last visit.  He is currently growing a beard because he would like to dress up as [employer] Hawking for Christmas.  He reports overall he is feeling good and his strength is steady.  His energy levels are strong.  He reports he is doing his best to try to eat healthy but his weight has steadily increased up to 239 pounds from 232 pounds in April.  He reports his blood pressure is high today because of some stress at work with employees.  He reports that he also is working on his new house that he purchased with his wife.  He is not having any extra symptom such as runny nose, sore throat, or cough.  He denies any fevers, chills, sweats.  Overall he has no signs or symptoms concerning for recurrence of his lymphoma.  A full 10 point ROS was otherwise negative.   MEDICAL HISTORY:  Past Medical History:  Diagnosis Date   Cancer (HCC) 2021   follicular lymphoma   Obesity     SURGICAL HISTORY: Past Surgical History:  Procedure  Laterality Date   ANKLE FRACTURE SURGERY  2016   right, trauma repair after fall down stairs   HERNIA REPAIR     umbilical   IR IMAGING GUIDED PORT INSERTION  04/09/2020   IR IMAGING GUIDED PORT INSERTION  06/17/2022   IR REMOVAL TUN ACCESS  W/ PORT W/O FL MOD SED  11/27/2020   TONSILLECTOMY      SOCIAL HISTORY: Social History   Socioeconomic History   Marital status: Married    Spouse name: Not on file   Number of children: Not on file   Years of education: Not on file   Highest education level: Not on file  Occupational History   Not on file  Tobacco Use   Smoking status: Never   Smokeless tobacco: Never  Vaping Use   Vaping status: Never Used  Substance and Sexual Activity   Alcohol use: Yes    Alcohol/week: 3.0 standard drinks of alcohol    Types: 3 Shots of liquor per week   Drug use: Not Currently   Sexual activity: Not on file  Other Topics Concern   Not on file  Social History Narrative   Engaged, plan to marry in March 2021, been together since 2014.  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    [employer].   Most exercise on the job, working 7 days per week.   No children, but fiance has 5 children.    11/2019   Social Determinants of Health   Financial Resource Strain: Not on file  Food Insecurity: Not on file  Transportation Needs: Not on file  Physical Activity: Not on file  Stress: Not on file  Social Connections: Not on file  Intimate Partner Violence: Not on file    FAMILY HISTORY: Family History  Problem Relation Age of Onset   Cancer Mother        lung; former smoker   COPD Father        emphysema   Diabetes Maternal Aunt    Heart disease Maternal Uncle    Heart disease Maternal Uncle    Diabetes Maternal Aunt    Stroke Neg Hx    Hyperlipidemia Neg Hx    Hypertension Neg Hx     ALLERGIES:  has No Known Allergies.  MEDICATIONS:  Current Outpatient Medications  Medication Sig Dispense Refill   acetaminophen (TYLENOL) 500 MG tablet Take 2 tablets (1,000 mg total) by mouth every 6 (six) hours as needed. 30 tablet 0   amLODipine (NORVASC) 5 MG tablet Take 1 tablet (5 mg total) by mouth daily. 30 tablet 3   No current facility-administered medications for this visit.    Facility-Administered Medications Ordered in Other Visits  Medication Dose Route Frequency Provider Last Rate Last Admin   sodium chloride flush (NS) 0.9 % injection 10 mL  10 mL Intracatheter PRN Jaci Standard, MD   10 mL at 10/27/22 1750    REVIEW OF SYSTEMS:   Constitutional: ( - ) fevers, ( - )  chills , ( - ) night sweats Eyes: ( - ) blurriness of vision, ( - ) double vision, ( - ) watery eyes Ears, nose, mouth, throat, and face: ( - ) mucositis, ( - ) sore throat Respiratory: ( - ) cough, ( - ) dyspnea, ( - ) wheezes Cardiovascular: ( - ) palpitation, ( - ) chest discomfort, ( - ) lower extremity swelling Gastrointestinal:  ( - ) nausea, ( - ) heartburn, ( - ) change in bowel  habits Skin: ( - ) abnormal skin rashes Lymphatics: ( - ) new lymphadenopathy, ( - ) easy bruising Neurological: ( - ) numbness, ( - ) tingling, ( - ) new weaknesses Behavioral/Psych: ( - ) mood change, ( - ) new changes  All other systems were reviewed with the patient and are negative.  PHYSICAL EXAMINATION: ECOG PERFORMANCE STATUS: 1 - Symptomatic but completely ambulatory  Vitals:   06/14/23 0952  BP: (!) 168/99  Pulse: (!) 57  Resp: 16  Temp: 97.8 F (36.6 C)  SpO2: 100%   Filed Weights   06/14/23 0952  Weight: 239 lb 4.8 oz (108.5 kg)    GENERAL: well appearing middle aged Caucasian male in NAD  SKIN: skin color, texture, turgor are normal, no rashes or significant lesions EYES: conjunctiva are pink and non-injected, sclera clear LUNGS: clear to auscultation and percussion with normal breathing effort HEART: regular rate & rhythm and no murmurs and +1 pitting edema in lower extremity bilaterally.  Musculoskeletal: no cyanosis of digits and no clubbing  PSYCH: alert & oriented x 3, fluent speech NEURO: no focal motor/sensory deficits  LABORATORY DATA:  I have reviewed the data as listed    Latest Ref Rng & Units 06/14/2023    9:33 AM 03/29/2023   11:44 AM 02/16/2023   10:35 AM   CBC  WBC 4.0 - 10.5 K/uL 6.0  6.8  4.8   Hemoglobin 13.0 - 17.0 g/dL 16.1  09.6  04.5   Hematocrit 39.0 - 52.0 % 40.7  39.2  40.1   Platelets 150 - 400 K/uL 226  218  236        Latest Ref Rng & Units 06/14/2023    9:33 AM 03/29/2023   11:44 AM 02/16/2023   10:35 AM  CMP  Glucose 70 - 99 mg/dL 409  811  92   BUN 8 - 23 mg/dL 16  19  12    Creatinine 0.61 - 1.24 mg/dL 9.14  7.82  9.56   Sodium 135 - 145 mmol/L 141  138  142   Potassium 3.5 - 5.1 mmol/L 4.3  3.8  3.9   Chloride 98 - 111 mmol/L 106  104  106   CO2 22 - 32 mmol/L 28  26  30    Calcium 8.9 - 10.3 mg/dL 9.3  9.2  9.6   Total Protein 6.5 - 8.1 g/dL 7.1  6.5  7.1   Total Bilirubin 0.3 - 1.2 mg/dL 0.6  0.9  0.5   Alkaline Phos 38 - 126 U/L 84  70  85   AST 15 - 41 U/L 12  14  24    ALT 0 - 44 U/L 13  13  22     RADIOGRAPHIC STUDIES: I have personally reviewed the radiological images as listed and agreed with the findings in the report: Marked response in the patient's lymphadenopathy and decrease in his FDG avidity.  Bone marrow involvement also appears considerably less.  Overall consistent with a partial response.  CT CHEST ABDOMEN PELVIS W CONTRAST  Result Date: 06/10/2023 CLINICAL DATA:  Hematologic malignancy, follicular lymphoma. * Tracking Code: BO * EXAM: CT CHEST, ABDOMEN, AND PELVIS WITH CONTRAST TECHNIQUE: Multidetector CT imaging of the chest, abdomen and pelvis was performed following the standard protocol during bolus administration of intravenous contrast. RADIATION DOSE REDUCTION: This exam was performed according to the departmental dose-optimization program which includes automated exposure control, adjustment of the mA and/or kV according to patient size and/or use of iterative reconstruction  technique. CONTRAST:  OMNIPAQUE IOHEXOL 300 MG/ML  SOLN COMPARISON:  Multiple priors including most recent CT abdomen pelvis and PET-CT April 21, 2019. FINDINGS: CT CHEST FINDINGS Cardiovascular: Accessed right chest  Port-A-Cath with tip at the superior cavoatrial junction. Aortic atherosclerosis. Unchanged aneurysmal dilation of the ascending thoracic aorta measuring 4 cm. No central pulmonary embolus on this nondedicated study. Normal size heart. Coronary artery calcifications. No significant pericardial effusion/thickening. Mediastinum/Nodes: Stable small non pathologically enlarged axillary lymph nodes. For reference: -right axillary lymph node measures 3-4 mm in short axis on image 15/2 previously 4 mm. -left axillary lymph node measures 6 mm in short axis on image 16/2 previously 7 mm No pathologically enlarged mediastinal, hilar or axillary lymph nodes. The esophagus is grossly unremarkable. No suspicious thyroid nodule. Lungs/Pleura: Stable 3 mm right lower lobe pulmonary nodule on image 104/4. No new suspicious pulmonary nodules or masses. No pleural effusion. No pneumothorax Musculoskeletal: No aggressive lytic or blastic lesion of bone CT ABDOMEN PELVIS FINDINGS Hepatobiliary: No suspicious hepatic lesion. Gallbladder is unremarkable. No biliary ductal dilation. Pancreas: No pancreatic ductal dilation or evidence of acute inflammation Spleen: No splenomegaly. Adrenals/Urinary Tract: Similar size of the right adrenal nodule now measuring 8 mm on image 63/2 previously 9 mm. Left adrenal gland appears normal. No hydronephrosis. Similar residual minimal perinephric fat stranding and ill-defined nonmeasurable soft tissue about the bilateral upper pole kidneys. Kidneys demonstrate symmetric enhancement. Urinary bladder is unremarkable for degree of distension Stomach/Bowel: Stomach is unremarkable for degree of distension. Radiopaque enteric contrast material traverses the ileocecal valve. No pathologic dilation of small or large bowel. No evidence of acute bowel inflammation Vascular/Lymphatic: Normal caliber abdominal aorta. The portal, splenic and superior mesenteric veins are patent. Similar retroperitoneal soft  tissue/lymph nodes and stranding. For reference: -left periaortic lymph node now measures 3.3 x 2.1 cm on image 79/2, unchanged Increased size of the left inguinal lymph nodes with stable right inguinal lymph nodes. Previously indexed left inguinal lymph node now has a centrally necrotic component with a node measuring 3.1 x 2.7 cm on image 129/2 previously 19 x 16 mm. Inguinal lymph node measures Reproductive: Prostate is unremarkable. Other: Small fat containing left inguinal hernia Musculoskeletal: No aggressive lytic or blastic lesion of bone. Multilevel degenerative change of the spine. IMPRESSION: 1. Increased size of the left inguinal lymph nodes with the largest node now demonstrating a centrally nodular component. 2. Otherwise stable lymph nodes above and below the diaphragm with unchanged minimal residual ill-defined perinephric soft tissue/fluid. 3. Similar size of the right adrenal nodule. 4.  Aortic Atherosclerosis (ICD10-I70.0). Electronically Signed   By: Maudry Mayhew M.D.   On: 06/10/2023 15:44    ASSESSMENT & PLAN KINDALL KENNEL 67 y.o. male with medical history significant for diagnosed follicular lymphoma who presents for a follow up visit.   He completed 6 cycles of Bendamustine/Rituximab therapy from 04/30/2020-09/17/2020. Repeat PET CT scan on 10/26/2020 showed a partial response. OK to enter observation.   CT scan from 04/30/2022 and doppler US from 05/05/2022 showed enlarging adenopathy. Recommended second line therapy with R-CHOP. Regimen consists of Rituximab 375 mg/m2, cytoxan 750 mg/m2, vincristine 2 mg IV, adriamycin 50 mg/m2 along with prednisone 60 mg PO on days 1-5. This regimen is given every 21 days.   # Follicular Lymphoma. Stage III --patient completed Cycle 6 of R-Benda therapy. This was administered with curative intent.  --PET CT scan after 6 cycles of R-Benda showed substantial partial response. Residual mild hypermetabolism within the retroperitoneal and  left  common iliac chains (Deauville score 3). No residual metabolically active adenopathy in the neck or chest --per [place] Criteria, patient is a Partial Response.  --CT neck and CAP from 04/30/2022 showed progressive adenopathy.  --Doppler  US from 05/05/2022 showed progressive inguinal adenopathy, bilateral. --Recommend systemic therapy with R-CHOP started on 06/29/2022 ----Baseline ECHO from 06/10/2022 shows EF 60-65% Plan:  --patient completed 6 cycles of R-CHOP --Labs from today were reviewed and show white blood cell 6.0, hemoglobin 14.4, MCV 93.8, and platelets of 226.  LFTs and creatinine are within normal limits. -- Patient will be due for a CT scan in Feb 2025. Last scan in August 2024 showed mild progression of lymph nodes.  --RTC in 3 months for clinic visit and 6 months with CT scan.   # Well Differentiated Neuroendocrine Tumor -- Continue to monitor in conjunction with his follicular lymphoma. -- No elevations in chromogranin or evidence of more disease on PET dotatate  # Left Lower Leg Swelling, improved --Patient has no more redness and +1 pitting edema of left lower extremity. Swelling L>R --Doppler US from 05/05/2022 showed no evidence of DVT. There are enlarged lymph nodes noted in the groin, bilaterally which is likely causing the swelling.   #Supportive Care -- port placed -- zofran 8mg  q8H PRN and compazine 10mg  PO q6H for nausea -- EMLA cream for port -- no pain medication required at this time.   No orders of the defined types were placed in this encounter.   All questions were answered. The patient knows to call the clinic with any problems, questions or concerns.  I have spent a total of 30 minutes minutes of face-to-face and non-face-to-face time, preparing to see the patient,  performing a medically appropriate examination, counseling and educating the patient, ordering medications, documenting clinical information in the electronic health record, and care  coordination.   Ulysees Barns, MD Department of Hematology/Oncology [facility] Phone: [phone] Pager: [phone] Email: [email or web address]  06/14/2023 10:22 AM

## 2023-06-15 ENCOUNTER — Telehealth: Payer: Self-pay | Admitting: Hematology and Oncology

## 2023-06-17 ENCOUNTER — Other Ambulatory Visit: Payer: Self-pay

## 2023-06-21 ENCOUNTER — Other Ambulatory Visit: Payer: Self-pay

## 2023-06-22 ENCOUNTER — Ambulatory Visit: Payer: Medicare Other | Admitting: Hematology and Oncology

## 2023-06-22 ENCOUNTER — Other Ambulatory Visit: Payer: Medicare Other

## 2023-09-14 ENCOUNTER — Other Ambulatory Visit: Payer: Self-pay | Admitting: Hematology and Oncology

## 2023-09-14 ENCOUNTER — Inpatient Hospital Stay: Payer: Medicare Other | Attending: Hematology and Oncology

## 2023-09-14 ENCOUNTER — Inpatient Hospital Stay: Payer: Medicare Other | Attending: Hematology and Oncology | Admitting: Hematology and Oncology

## 2023-09-14 VITALS — BP 187/93 | HR 51 | Temp 97.6°F | Resp 16 | Wt 240.0 lb

## 2023-09-14 DIAGNOSIS — C8228 Follicular lymphoma grade III, unspecified, lymph nodes of multiple sites: Secondary | ICD-10-CM | POA: Insufficient documentation

## 2023-09-14 DIAGNOSIS — R591 Generalized enlarged lymph nodes: Secondary | ICD-10-CM | POA: Diagnosis not present

## 2023-09-14 DIAGNOSIS — C8233 Follicular lymphoma grade IIIa, intra-abdominal lymph nodes: Secondary | ICD-10-CM | POA: Diagnosis not present

## 2023-09-14 DIAGNOSIS — Z95828 Presence of other vascular implants and grafts: Secondary | ICD-10-CM | POA: Diagnosis not present

## 2023-09-14 DIAGNOSIS — Z79899 Other long term (current) drug therapy: Secondary | ICD-10-CM | POA: Diagnosis not present

## 2023-09-14 LAB — CBC WITH DIFFERENTIAL (CANCER CENTER ONLY)
Abs Immature Granulocytes: 0.02 10*3/uL (ref 0.00–0.07)
Basophils Absolute: 0 10*3/uL (ref 0.0–0.1)
Basophils Relative: 0 %
Eosinophils Absolute: 0.2 10*3/uL (ref 0.0–0.5)
Eosinophils Relative: 4 %
HCT: 39 % (ref 39.0–52.0)
Hemoglobin: 13.6 g/dL (ref 13.0–17.0)
Immature Granulocytes: 0 %
Lymphocytes Relative: 18 %
Lymphs Abs: 1 10*3/uL (ref 0.7–4.0)
MCH: 31.6 pg (ref 26.0–34.0)
MCHC: 34.9 g/dL (ref 30.0–36.0)
MCV: 90.7 fL (ref 80.0–100.0)
Monocytes Absolute: 0.5 10*3/uL (ref 0.1–1.0)
Monocytes Relative: 9 %
Neutro Abs: 3.8 10*3/uL (ref 1.7–7.7)
Neutrophils Relative %: 69 %
Platelet Count: 245 10*3/uL (ref 150–400)
RBC: 4.3 MIL/uL (ref 4.22–5.81)
RDW: 12.2 % (ref 11.5–15.5)
WBC Count: 5.5 10*3/uL (ref 4.0–10.5)
nRBC: 0 % (ref 0.0–0.2)

## 2023-09-14 LAB — CMP (CANCER CENTER ONLY)
ALT: 14 U/L (ref 0–44)
AST: 15 U/L (ref 15–41)
Albumin: 4.4 g/dL (ref 3.5–5.0)
Alkaline Phosphatase: 86 U/L (ref 38–126)
Anion gap: 7 (ref 5–15)
BUN: 18 mg/dL (ref 8–23)
CO2: 26 mmol/L (ref 22–32)
Calcium: 9.7 mg/dL (ref 8.9–10.3)
Chloride: 105 mmol/L (ref 98–111)
Creatinine: 0.89 mg/dL (ref 0.61–1.24)
GFR, Estimated: >60 mL/min (ref >60)
Glucose, Bld: 91 mg/dL (ref 70–99)
Potassium: 4 mmol/L (ref 3.5–5.1)
Sodium: 138 mmol/L (ref 135–145)
Total Bilirubin: 0.7 mg/dL (ref <1.2)
Total Protein: 7 g/dL (ref 6.5–8.1)

## 2023-09-14 LAB — LACTATE DEHYDROGENASE: LDH: 215 U/L — ABNORMAL HIGH (ref 98–192)

## 2023-09-14 NOTE — Progress Notes (Signed)
[facility] [phone]   [phone]  PROGRESS NOTE  Patient Care Team: Jaci Standard, MD as PCP - General (Hematology and Oncology)  Hematological/Oncological History # Follicular Lymphoma. Stage III 1) 01/07/2020: CT Neck W contrast showed cervical lymphadenopathy bilaterally, left greater than right. Numerous enlarged lymph nodes are present 2)  01/22/2020: [place] Core biopsy performed which revealed overall features consistent with involvement by non-Hodgkin's B-cell lymphoma and the morphologic and phenotypic findings favor follicular lymphoma.  3) 01/31/2020: establish care with Dr. Leonides Schanz  4) 02/12/2020: PET CT scan demonstrates bulky adenopathy in the neck, chest, abdomen and pelvis and diffuse skeletal uptake is suspicious for (but not diagnostic) of marrow involvement. 5) 04/30/2020: Cycle 1 Day 1 of Bendamustine/Rituximab 6) 05/29/2020: Cycle 2 Day 1 of Bendamustine/Rituximab 7) 06/25/2020: Cycle 3 Day 1 of Bendamustine/Rituximab 8) 07/23/2020: Cycle 4 Day 1 of Bendamustine/Rituximab 9) 08/25/2020: Cycle 5 Day 1 of Bendamustine/Rituximab 10)09/17/2020: Cycle 6 Day 1 of Bendamustine/Rituximab 11) 10/26/2020: PET CT scan shows partial response (Deauville Score 3 in lymph nodes but some residual FDG avidity in the bone marrow).  12) 04/27/2021: CT scan showed interval decreased adenopathy below the diaphragm, consistent with treatment response. No new or enlarging suspicious lymph nodes 13) 10/27/2021: CT scan showed increased left inguinal adenopathy, including a 1.6 cm left inguinal lymph node which was previously 0.9 cm in short axis. 14) 04/30/2022: CT scan shows progressive adenopathy below the diaphragm with new thoracic adenopathy and increased hazy retroperitoneal stranding, consistent with worsening lymphomatous disease. 15) 05/05/2022: Doppler US of lower extremities revealed bilateral lymph nodes in the groin.   16) 06/10/2022: Echo: EF 60-65% 17)  06/29/2022: Cycle 1 of R-CHOP 18) 07/26/2022: Cycle 2 of R-CHOP 19) 08/17/2022: Cycle 3 of R-CHOP 20) 09/07/2022: Cycle 4 of R-CHOP 21) 09/29/2022: Cycle 5 of R-CHOP 22) 10/27/2022: Cycle 6 of R-CHOP  # Well Differentiated Neuroendocrine Tumor 03/08/2023: Routine colonoscopy performed and patient was found to have a well-differentiated neuroendocrine tumor in the ileum.  Tumor was 0.5 cm with a Ki-67 of less than 1%  Interval History:  Jorge Gould 67 y.o. male with medical history significant for Stage III follicular lymphoma who presents for a follow up visit. The patient's last visit was on 06/14/2023. In the interim since the last visit he has had no major changes in his health.  On exam today Jorge. Gould reports he has been as busy as ever in the interim since her last visit.  He reports his energy levels are good and his appetite is strong.  He reports that he does have a spot on the inside of his left thigh which he describes as being "on fire".  He reports he puts a cold washcloth on and it helps with the discomfort.  He reports that he is concerned it may be shingles but he only has 1 single spot with no other lesions and no other pain along the dermatomal distribution.  He reports that he is doing his best to try to stay well-hydrated.  He is disappointed that his weight is up to 240 and he would like to start losing weight.  He reports that he is been working 7 days straight for the last 6 and half months.  He has not had any issues with headache, vision change, or chest pain.  He is afraid to take his blood pressure medications because he is afraid to make him fatigued and slow him down.  He denies any fevers, chills,  sweats.  Overall he has no signs or symptoms concerning for progression of his lymphoma.  A full 10 point ROS was otherwise negative.   MEDICAL HISTORY:  Past Medical History:  Diagnosis Date   Cancer (HCC) 2021   follicular lymphoma   Obesity     SURGICAL HISTORY: Past  Surgical History:  Procedure Laterality Date   ANKLE FRACTURE SURGERY  2016   right, trauma repair after fall down stairs   HERNIA REPAIR     umbilical   IR IMAGING GUIDED PORT INSERTION  04/09/2020   IR IMAGING GUIDED PORT INSERTION  06/17/2022   IR REMOVAL TUN ACCESS W/ PORT W/O FL MOD SED  11/27/2020   TONSILLECTOMY      SOCIAL HISTORY: Social History   Socioeconomic History   Marital status: Married    Spouse name: Not on file   Number of children: Not on file   Years of education: Not on file   Highest education level: Not on file  Occupational History   Not on file  Tobacco Use   Smoking status: Never   Smokeless tobacco: Never  Vaping Use   Vaping status: Never Used  Substance and Sexual Activity   Alcohol use: Yes    Alcohol/week: 3.0 standard drinks of alcohol    Types: 3 Shots of liquor per week   Drug use: Not Currently   Sexual activity: Not on file  Other Topics Concern   Not on file  Social History Narrative   Engaged, plan to marry in March 2021, been together since 2014.  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    [employer].   Most exercise on the job, working 7 days per week.   No children, but fiance has 5 children.    11/2019   Social Determinants of Health   Financial Resource Strain: Not on file  Food Insecurity: Not on file  Transportation Needs: Not on file  Physical Activity: Not on file  Stress: Not on file  Social Connections: Not on file  Intimate Partner Violence: Not on file    FAMILY HISTORY: Family History  Problem Relation Age of Onset   Cancer Mother        lung; former smoker   COPD Father        emphysema   Diabetes Maternal Aunt    Heart disease Maternal Uncle    Heart disease Maternal Uncle    Diabetes Maternal Aunt    Stroke Neg Hx    Hyperlipidemia Neg Hx    Hypertension Neg Hx     ALLERGIES:  has No Known Allergies.  MEDICATIONS:  Current Outpatient Medications  Medication Sig Dispense Refill   acetaminophen  (TYLENOL) 500 MG tablet Take 2 tablets (1,000 mg total) by mouth every 6 (six) hours as needed. 30 tablet 0   amLODipine (NORVASC) 5 MG tablet Take 1 tablet (5 mg total) by mouth daily. 30 tablet 3   No current facility-administered medications for this visit.   Facility-Administered Medications Ordered in Other Visits  Medication Dose Route Frequency Provider Last Rate Last Admin   sodium chloride flush (NS) 0.9 % injection 10 mL  10 mL Intracatheter PRN Ulysees Barns IV, MD   10 mL at 10/27/22 1750    REVIEW OF SYSTEMS:   Constitutional: ( - ) fevers, ( - )  chills , ( - ) night sweats Eyes: ( - ) blurriness of vision, ( - ) double vision, ( - ) watery eyes Ears, nose,  mouth, throat, and face: ( - ) mucositis, ( - ) sore throat Respiratory: ( - ) cough, ( - ) dyspnea, ( - ) wheezes Cardiovascular: ( - ) palpitation, ( - ) chest discomfort, ( - ) lower extremity swelling Gastrointestinal:  ( - ) nausea, ( - ) heartburn, ( - ) change in bowel habits Skin: ( - ) abnormal skin rashes Lymphatics: ( - ) new lymphadenopathy, ( - ) easy bruising Neurological: ( - ) numbness, ( - ) tingling, ( - ) new weaknesses Behavioral/Psych: ( - ) mood change, ( - ) new changes  All other systems were reviewed with the patient and are negative.  PHYSICAL EXAMINATION: ECOG PERFORMANCE STATUS: 1 - Symptomatic but completely ambulatory  Vitals:   09/14/23 1030  BP: (!) 187/93  Pulse: (!) 51  Resp: 16  Temp: 97.6 F (36.4 C)  SpO2: 100%    Filed Weights   09/14/23 1030  Weight: 240 lb (108.9 kg)     GENERAL: well appearing middle aged Caucasian male in NAD  SKIN: skin color, texture, turgor are normal, no rashes or significant lesions EYES: conjunctiva are pink and non-injected, sclera clear LUNGS: clear to auscultation and percussion with normal breathing effort HEART: regular rate & rhythm and no murmurs and +1 pitting edema in lower extremity bilaterally.  Musculoskeletal: no cyanosis of  digits and no clubbing  PSYCH: alert & oriented x 3, fluent speech NEURO: no focal motor/sensory deficits  LABORATORY DATA:  I have reviewed the data as listed    Latest Ref Rng & Units 09/14/2023   10:22 AM 06/14/2023    9:33 AM 03/29/2023   11:44 AM  CBC  WBC 4.0 - 10.5 K/uL 5.5  6.0  6.8   Hemoglobin 13.0 - 17.0 g/dL 91.4  78.2  95.6   Hematocrit 39.0 - 52.0 % 39.0  40.7  39.2   Platelets 150 - 400 K/uL 245  226  218        Latest Ref Rng & Units 09/14/2023   10:22 AM 06/14/2023    9:33 AM 03/29/2023   11:44 AM  CMP  Glucose 70 - 99 mg/dL 91  213  086   BUN 8 - 23 mg/dL 18  16  19    Creatinine 0.61 - 1.24 mg/dL 5.78  4.69  6.29   Sodium 135 - 145 mmol/L 138  141  138   Potassium 3.5 - 5.1 mmol/L 4.0  4.3  3.8   Chloride 98 - 111 mmol/L 105  106  104   CO2 22 - 32 mmol/L 26  28  26    Calcium 8.9 - 10.3 mg/dL 9.7  9.3  9.2   Total Protein 6.5 - 8.1 g/dL 7.0  7.1  6.5   Total Bilirubin <1.2 mg/dL 0.7  0.6  0.9   Alkaline Phos 38 - 126 U/L 86  84  70   AST 15 - 41 U/L 15  12  14    ALT 0 - 44 U/L 14  13  13     RADIOGRAPHIC STUDIES: I have personally reviewed the radiological images as listed and agreed with the findings in the report: Marked response in the patient's lymphadenopathy and decrease in his FDG avidity.  Bone marrow involvement also appears considerably less.  Overall consistent with a partial response.  No results found.  ASSESSMENT & PLAN Jorge Gould 67 y.o. male with medical history significant for diagnosed follicular lymphoma who presents for a follow up visit.  He completed 6 cycles of Bendamustine/Rituximab therapy from 04/30/2020-09/17/2020. Repeat PET CT scan on 10/26/2020 showed a partial response. OK to enter observation.   CT scan from 04/30/2022 and doppler US from 05/05/2022 showed enlarging adenopathy. Recommended second line therapy with R-CHOP. Regimen consists of Rituximab 375 mg/m2, cytoxan 750 mg/m2, vincristine 2 mg IV, adriamycin 50 mg/m2  along with prednisone 60 mg PO on days 1-5. This regimen is given every 21 days.   # Follicular Lymphoma. Stage III --patient completed Cycle 6 of R-Benda therapy. This was administered with curative intent.  --PET CT scan after 6 cycles of R-Benda showed substantial partial response. Residual mild hypermetabolism within the retroperitoneal and left common iliac chains (Deauville score 3). No residual metabolically active adenopathy in the neck or chest --per [place] Criteria, patient is a Partial Response.  --CT neck and CAP from 04/30/2022 showed progressive adenopathy.  --Doppler  US from 05/05/2022 showed progressive inguinal adenopathy, bilateral. --Recommend systemic therapy with R-CHOP started on 06/29/2022 ----Baseline ECHO from 06/10/2022 shows EF 60-65% Plan:  --patient completed 6 cycles of R-CHOP --Labs from today were reviewed and show white blood cell 5.5, hemoglobin 13.6, MCV 90.7, platelets 245.  LFTs and creatinine are within normal limits. -- Patient will be due for a CT scan in Feb 2025. Last scan in August 2024 showed mild progression of lymph nodes.  --RTC in 3 months for clinic visit and 6 months with CT scan.   # Well Differentiated Neuroendocrine Tumor -- Continue to monitor in conjunction with his follicular lymphoma. -- No elevations in chromogranin or evidence of more disease on PET dotatate  # Left Lower Leg Swelling, improved --Patient has no more redness and +1 pitting edema of left lower extremity. Swelling L>R --Doppler US from 05/05/2022 showed no evidence of DVT. There are enlarged lymph nodes noted in the groin, bilaterally which is likely causing the swelling.   #Supportive Care -- port placed -- zofran 8mg  q8H PRN and compazine 10mg  PO q6H for nausea -- EMLA cream for port -- no pain medication required at this time.   Orders Placed This Encounter  Procedures   CT CHEST ABDOMEN PELVIS W CONTRAST    Standing Status:   Future    Standing Expiration Date:    09/13/2024    Order Specific Question:   If indicated for the ordered procedure, I authorize the administration of contrast media per Radiology protocol    Answer:   Yes    Order Specific Question:   Does the patient have a contrast media/X-ray dye allergy?    Answer:   No    Order Specific Question:   Preferred imaging location?    Answer:   [facility]    Order Specific Question:   If indicated for the ordered procedure, I authorize the administration of oral contrast media per Radiology protocol    Answer:   Yes    All questions were answered. The patient knows to call the clinic with any problems, questions or concerns.  I have spent a total of 30 minutes minutes of face-to-face and non-face-to-face time, preparing to see the patient,  performing a medically appropriate examination, counseling and educating the patient, ordering medications, documenting clinical information in the electronic health record, and care coordination.   Ulysees Barns, MD Department of Hematology/Oncology [facility] Phone: [phone] Pager: [phone] Email: [email or web address]  09/14/2023 3:02 PM

## 2023-09-16 ENCOUNTER — Other Ambulatory Visit: Payer: Self-pay

## 2023-10-15 ENCOUNTER — Other Ambulatory Visit: Payer: Self-pay

## 2023-12-05 ENCOUNTER — Ambulatory Visit (HOSPITAL_COMMUNITY)
Admission: RE | Admit: 2023-12-05 | Discharge: 2023-12-05 | Disposition: A | Discharge status: Home / Self Care | Payer: Medicare Other | Source: Ambulatory Visit | Attending: Hematology and Oncology | Admitting: Hematology and Oncology

## 2023-12-05 ENCOUNTER — Encounter (HOSPITAL_COMMUNITY): Payer: Self-pay

## 2023-12-05 DIAGNOSIS — I700 Atherosclerosis of aorta: Secondary | ICD-10-CM | POA: Diagnosis not present

## 2023-12-05 DIAGNOSIS — C8233 Follicular lymphoma grade IIIa, intra-abdominal lymph nodes: Secondary | ICD-10-CM | POA: Insufficient documentation

## 2023-12-05 DIAGNOSIS — K769 Liver disease, unspecified: Secondary | ICD-10-CM | POA: Diagnosis not present

## 2023-12-05 DIAGNOSIS — R590 Localized enlarged lymph nodes: Secondary | ICD-10-CM | POA: Diagnosis not present

## 2023-12-05 MED ORDER — HEPARIN SOD (PORK) LOCK FLUSH 100 UNIT/ML IV SOLN
500.0000 [IU] | Freq: Once | INTRAVENOUS | Status: AC
  Administered 2023-12-05: 500 [IU] via INTRAVENOUS

## 2023-12-05 MED ORDER — IOHEXOL 300 MG/ML  SOLN
30.0000 mL | Freq: Once | INTRAMUSCULAR | Status: AC | PRN
  Administered 2023-12-05: 30 mL via ORAL

## 2023-12-05 MED ORDER — IOHEXOL 300 MG/ML  SOLN
100.0000 mL | Freq: Once | INTRAMUSCULAR | Status: AC | PRN
  Administered 2023-12-05: 100 mL via INTRAVENOUS

## 2023-12-05 MED ORDER — HEPARIN SOD (PORK) LOCK FLUSH 100 UNIT/ML IV SOLN
INTRAVENOUS | Status: AC
  Filled 2023-12-05: qty 5

## 2023-12-06 ENCOUNTER — Other Ambulatory Visit: Payer: Self-pay

## 2023-12-08 ENCOUNTER — Encounter: Payer: Self-pay | Admitting: Medical

## 2023-12-08 ENCOUNTER — Encounter: Payer: Self-pay | Admitting: Hematology and Oncology

## 2023-12-08 ENCOUNTER — Ambulatory Visit (INDEPENDENT_AMBULATORY_CARE_PROVIDER_SITE_OTHER): Payer: Medicare Other | Admitting: Medical

## 2023-12-08 VITALS — BP 130/82 | HR 75 | Wt 235.8 lb

## 2023-12-08 DIAGNOSIS — R079 Chest pain, unspecified: Secondary | ICD-10-CM | POA: Diagnosis not present

## 2023-12-08 DIAGNOSIS — I8393 Asymptomatic varicose veins of bilateral lower extremities: Secondary | ICD-10-CM

## 2023-12-08 DIAGNOSIS — C801 Malignant (primary) neoplasm, unspecified: Secondary | ICD-10-CM

## 2023-12-08 DIAGNOSIS — E785 Hyperlipidemia, unspecified: Secondary | ICD-10-CM

## 2023-12-08 DIAGNOSIS — R630 Anorexia: Secondary | ICD-10-CM

## 2023-12-08 DIAGNOSIS — R0602 Shortness of breath: Secondary | ICD-10-CM | POA: Diagnosis not present

## 2023-12-08 DIAGNOSIS — R030 Elevated blood-pressure reading, without diagnosis of hypertension: Secondary | ICD-10-CM

## 2023-12-08 NOTE — Progress Notes (Signed)
Subjective:  Jorge Gould is a 68 y.o. male who presents for Chief Complaint  Patient presents with   other    Dizziness, SOB, loss of appetite, no temp, no coughing, low energy level, breathing is worse over the past month, had CT Tuesday, see's Dorsey on 12/14/23 taking muicnex daily,      Here as a returning patient.  Last visit here 2021.   In recent months he has been seeing oncology for diagnosis of lymphoma, diagnosed 2021.     He has been in normal state of health up until the last month.  He notes in last month having shortness of breath, occasional chest pain, sometimes tightness in chest.  Appetite is no good.   Bowels are regular though. Sometimes unsteady on feet, but thinks its related to the SOB and fatigue.  Sometimes gets some leg swelling when he wears socks.  He can notice a line where the socks indent.    Nonsmoker.  Up until 4 months ago drank some occasionally but no alcohol in 4 months, and no drugs.  Had a bad cold a month ago, took mucinex, tylenol, Nyquil and that seemed to resolve  No bleeding or bruising.  No belly pain.   No nausea or vomiting.  No hematemesis.    He just had a chest abdomen pelvis CT a few days ago, awaiting results of this  No other aggravating or relieving factors.    No other c/o.  Past Medical History:  Diagnosis Date   Cancer (HCC) 2021   follicular lymphoma   Obesity    Current Outpatient Medications on File Prior to Visit  Medication Sig Dispense Refill   acetaminophen (TYLENOL) 500 MG tablet Take 2 tablets (1,000 mg total) by mouth every 6 (six) hours as needed. 30 tablet 0   amLODipine (NORVASC) 5 MG tablet Take 1 tablet (5 mg total) by mouth daily. (Patient not taking: Reported on 12/08/2023) 30 tablet 3   Current Facility-Administered Medications on File Prior to Visit  Medication Dose Route Frequency Provider Last Rate Last Admin   sodium chloride flush (NS) 0.9 % injection 10 mL  10 mL Intracatheter PRN Jaci Standard, MD   10 mL at 10/27/22 1750     The following portions of the patient's history were reviewed and updated as appropriate: allergies, current medications, past family history, past medical history, past social history, past surgical history and problem list.  ROS Otherwise as in subjective above   Objective: BP 130/82   Pulse 75   Wt 235 lb 12.8 oz (107 kg)   SpO2 96%   BMI 31.98 kg/m   General appearance: alert, no distress, well developed, well nourished HEENT: normocephalic, sclerae anicteric, conjunctiva pink and moist,  nares patent, no discharge or erythema, pharynx normal Oral cavity: MMM, no lesions Neck: supple, no lymphadenopathy, no thyromegaly, no masses, no JVD or bruits Heart: RRR, normal S1, S2, no murmurs Lungs: CTA bilaterally, no wheezes, rhonchi, or rales Abdomen: +bs, soft, non tender, non distended, no masses, no hepatomegaly, no splenomegaly Pulses: 2+ radial pulses, 2+ pedal pulses, normal cap refill Ext: Moderate varicose veins of right lower leg, mild varicose veins left lower leg, no leg swelling or asymmetry otherwise, no appreciated edema today.    EKG normal   Assessment: Encounter Diagnoses  Name Primary?   SOB (shortness of breath) Yes   Chest pain, unspecified type    Appetite impaired    Dyslipidemia    Elevated  blood-pressure reading without diagnosis of hypertension    Varicose veins of both lower extremities, unspecified whether complicated    Cancer (HCC)      Plan: Discussed symptoms, concerns, possible differential. Labs as below.   Discussed differential could include cardiac related issues, lung related issues, electrolyte disturbance, thyroid issues, cancer related problems.  Wide differential.  I reviewed his CT abdomen chest pelvis he just had a few days ago.  There is some concern for metastasis to the liver but lungs appear clear.  Advised rest, good hydration, follow-up with oncology as planned  We will call with  lab results   Tyjay was seen today for other.  Diagnoses and all orders for this visit:  SOB (shortness of breath) -     CBC with Differential/Platelet -     Comprehensive metabolic panel -     Brain natriuretic peptide -     Lactate dehydrogenase -     EKG 12-Lead -     TSH + free T4  Chest pain, unspecified type -     CBC with Differential/Platelet -     Comprehensive metabolic panel -     Brain natriuretic peptide -     Lactate dehydrogenase -     EKG 12-Lead -     TSH + free T4  Appetite impaired  Dyslipidemia  Elevated blood-pressure reading without diagnosis of hypertension -     EKG 12-Lead  Varicose veins of both lower extremities, unspecified whether complicated  Cancer (HCC) -     CBC with Differential/Platelet -     Comprehensive metabolic panel -     Lactate dehydrogenase    Follow up: pending labs.

## 2023-12-09 ENCOUNTER — Encounter: Payer: Self-pay | Admitting: Hematology and Oncology

## 2023-12-09 NOTE — Progress Notes (Signed)
 Results sent through MyChart

## 2023-12-13 ENCOUNTER — Other Ambulatory Visit: Payer: Self-pay | Admitting: Medical

## 2023-12-13 ENCOUNTER — Encounter: Payer: Self-pay | Admitting: Hematology and Oncology

## 2023-12-13 ENCOUNTER — Other Ambulatory Visit: Payer: Self-pay | Admitting: *Deleted

## 2023-12-13 DIAGNOSIS — C8233 Follicular lymphoma grade IIIa, intra-abdominal lymph nodes: Secondary | ICD-10-CM

## 2023-12-13 LAB — COMPREHENSIVE METABOLIC PANEL
ALT: 43 [IU]/L (ref 0–44)
AST: 62 [IU]/L — ABNORMAL HIGH (ref 0–40)
Albumin: 4 g/dL (ref 3.9–4.9)
Alkaline Phosphatase: 380 [IU]/L — ABNORMAL HIGH (ref 44–121)
BUN/Creatinine Ratio: 14 (ref 10–24)
BUN: 14 mg/dL (ref 8–27)
Bilirubin Total: 0.6 mg/dL (ref 0.0–1.2)
CO2: 21 mmol/L (ref 20–29)
Calcium: 9.3 mg/dL (ref 8.6–10.2)
Chloride: 99 mmol/L (ref 96–106)
Creatinine, Ser: 1.03 mg/dL (ref 0.76–1.27)
Globulin, Total: 2.7 g/dL (ref 1.5–4.5)
Glucose: 90 mg/dL (ref 70–99)
Potassium: 4.7 mmol/L (ref 3.5–5.2)
Sodium: 141 mmol/L (ref 134–144)
Total Protein: 6.7 g/dL (ref 6.0–8.5)
eGFR: 79 mL/min/{1.73_m2} (ref >59)

## 2023-12-13 LAB — CBC WITH DIFFERENTIAL/PLATELET
Basophils Absolute: 0 10*3/uL (ref 0.0–0.2)
Basos: 0 %
EOS (ABSOLUTE): 0.2 10*3/uL (ref 0.0–0.4)
Eos: 2 %
Hematocrit: 34.2 % — ABNORMAL LOW (ref 37.5–51.0)
Hemoglobin: 11 g/dL — ABNORMAL LOW (ref 13.0–17.7)
Immature Grans (Abs): 0 10*3/uL (ref 0.0–0.1)
Immature Granulocytes: 1 %
Lymphocytes Absolute: 0.8 10*3/uL (ref 0.7–3.1)
Lymphs: 12 %
MCH: 26.1 pg — ABNORMAL LOW (ref 26.6–33.0)
MCHC: 32.2 g/dL (ref 31.5–35.7)
MCV: 81 fL (ref 79–97)
Monocytes Absolute: 0.7 10*3/uL (ref 0.1–0.9)
Monocytes: 10 %
Neutrophils Absolute: 5.2 10*3/uL (ref 1.4–7.0)
Neutrophils: 75 %
Platelets: 385 10*3/uL (ref 150–450)
RBC: 4.22 x10E6/uL (ref 4.14–5.80)
RDW: 13.6 % (ref 11.6–15.4)
WBC: 6.9 10*3/uL (ref 3.4–10.8)

## 2023-12-13 LAB — TSH+FREE T4
Free T4: 0.85 ng/dL (ref 0.82–1.77)
TSH: 3.94 u[IU]/mL (ref 0.450–4.500)

## 2023-12-13 LAB — LACTATE DEHYDROGENASE: LDH: 219 IU/L (ref 121–224)

## 2023-12-13 LAB — BRAIN NATRIURETIC PEPTIDE: BNP: 42.3 pg/mL (ref 0.0–100.0)

## 2023-12-13 MED ORDER — AMOXICILLIN-POT CLAVULANATE 875-125 MG PO TABS: 1.0000 | ORAL_TABLET | Freq: Two times a day (BID) | ORAL | 0 refills | Status: DC

## 2023-12-13 MED ORDER — VALSARTAN 40 MG PO TABS
40.0000 mg | ORAL_TABLET | Freq: Every day | ORAL | 2 refills | Status: DC
Start: 2023-12-13 — End: 2024-02-02

## 2023-12-13 MED ORDER — PREDNISONE 20 MG PO TABS: ORAL_TABLET | ORAL | 0 refills | Status: DC

## 2023-12-13 MED ORDER — BENZONATATE 200 MG PO CAPS: 200.0000 mg | ORAL_CAPSULE | Freq: Three times a day (TID) | ORAL | 0 refills | Status: DC | PRN

## 2023-12-13 NOTE — Progress Notes (Signed)
After reviewing the chart and the notes from yesterday here are my recommendations  1-begin Augmentin antibiotic for your ongoing symptoms which have not resolved and are only 50% better as of yesterday.  This is twice a day for 10 days 2-you can use Mucinex to help clear out mucus.  This is over-the-counter twice a day for 5 days 3-I sent Tessalon Perle cough drops she can use as needed 4-begin a 3-day brief course of oral tablet prednisone to help reduce inflammation and help with the shortness of breath 5-since you are not taking amlodipine, I recommend you start low-dose valsartan blood pressure pill.  Your 2023 echocardiogram showed heart enlargement.  This medicine is to help control blood pressure and help to reduce strain on the heart and heart enlargement.  Obviously if you start having dizziness or blood pressure readings that are too low or lightheadedness then discontinue this medication or recheck  I recommend a follow-up in 3 to 4 weeks

## 2023-12-14 ENCOUNTER — Inpatient Hospital Stay: Payer: Medicare Other | Attending: Hematology and Oncology | Admitting: Hematology and Oncology

## 2023-12-14 ENCOUNTER — Other Ambulatory Visit: Payer: Medicare Other

## 2023-12-14 ENCOUNTER — Inpatient Hospital Stay: Payer: Medicare Other

## 2023-12-14 VITALS — BP 116/81 | HR 66 | Temp 97.6°F | Resp 13 | Wt 235.1 lb

## 2023-12-14 DIAGNOSIS — Z79899 Other long term (current) drug therapy: Secondary | ICD-10-CM | POA: Insufficient documentation

## 2023-12-14 DIAGNOSIS — C8228 Follicular lymphoma grade III, unspecified, lymph nodes of multiple sites: Secondary | ICD-10-CM | POA: Diagnosis not present

## 2023-12-14 DIAGNOSIS — Z95828 Presence of other vascular implants and grafts: Secondary | ICD-10-CM

## 2023-12-14 DIAGNOSIS — R590 Localized enlarged lymph nodes: Secondary | ICD-10-CM | POA: Diagnosis not present

## 2023-12-14 DIAGNOSIS — C787 Secondary malignant neoplasm of liver and intrahepatic bile duct: Secondary | ICD-10-CM | POA: Insufficient documentation

## 2023-12-14 DIAGNOSIS — C8290 Follicular lymphoma, unspecified, unspecified site: Secondary | ICD-10-CM | POA: Insufficient documentation

## 2023-12-14 DIAGNOSIS — C8233 Follicular lymphoma grade IIIa, intra-abdominal lymph nodes: Secondary | ICD-10-CM | POA: Diagnosis not present

## 2023-12-14 DIAGNOSIS — D649 Anemia, unspecified: Secondary | ICD-10-CM | POA: Insufficient documentation

## 2023-12-14 DIAGNOSIS — D3A8 Other benign neuroendocrine tumors: Secondary | ICD-10-CM | POA: Diagnosis not present

## 2023-12-14 LAB — CBC WITH DIFFERENTIAL (CANCER CENTER ONLY)
Abs Immature Granulocytes: 0.06 10*3/uL (ref 0.00–0.07)
Basophils Absolute: 0 10*3/uL (ref 0.0–0.1)
Basophils Relative: 0 %
Eosinophils Absolute: 0 10*3/uL (ref 0.0–0.5)
Eosinophils Relative: 0 %
HCT: 31 % — ABNORMAL LOW (ref 39.0–52.0)
Hemoglobin: 10.1 g/dL — ABNORMAL LOW (ref 13.0–17.0)
Immature Granulocytes: 1 %
Lymphocytes Relative: 4 %
Lymphs Abs: 0.5 10*3/uL — ABNORMAL LOW (ref 0.7–4.0)
MCH: 25.8 pg — ABNORMAL LOW (ref 26.0–34.0)
MCHC: 32.6 g/dL (ref 30.0–36.0)
MCV: 79.1 fL — ABNORMAL LOW (ref 80.0–100.0)
Monocytes Absolute: 0.4 10*3/uL (ref 0.1–1.0)
Monocytes Relative: 4 %
Neutro Abs: 10.7 10*3/uL — ABNORMAL HIGH (ref 1.7–7.7)
Neutrophils Relative %: 91 %
Platelet Count: 403 10*3/uL — ABNORMAL HIGH (ref 150–400)
RBC: 3.92 MIL/uL — ABNORMAL LOW (ref 4.22–5.81)
RDW: 14.5 % (ref 11.5–15.5)
WBC Count: 11.7 10*3/uL — ABNORMAL HIGH (ref 4.0–10.5)
nRBC: 0 % (ref 0.0–0.2)

## 2023-12-14 LAB — CMP (CANCER CENTER ONLY)
ALT: 44 U/L (ref 0–44)
AST: 60 U/L — ABNORMAL HIGH (ref 15–41)
Albumin: 3.9 g/dL (ref 3.5–5.0)
Alkaline Phosphatase: 357 U/L — ABNORMAL HIGH (ref 38–126)
Anion gap: 7 (ref 5–15)
BUN: 22 mg/dL (ref 8–23)
CO2: 28 mmol/L (ref 22–32)
Calcium: 9.3 mg/dL (ref 8.9–10.3)
Chloride: 100 mmol/L (ref 98–111)
Creatinine: 1.23 mg/dL (ref 0.61–1.24)
GFR, Estimated: >60 mL/min (ref >60)
Glucose, Bld: 212 mg/dL — ABNORMAL HIGH (ref 70–99)
Potassium: 4.3 mmol/L (ref 3.5–5.1)
Sodium: 135 mmol/L (ref 135–145)
Total Bilirubin: 0.9 mg/dL (ref 0.0–1.2)
Total Protein: 6.9 g/dL (ref 6.5–8.1)

## 2023-12-14 LAB — LACTATE DEHYDROGENASE: LDH: 2074 U/L — ABNORMAL HIGH (ref 98–192)

## 2023-12-14 MED ORDER — SODIUM CHLORIDE 0.9% FLUSH
10.0000 mL | Freq: Once | INTRAVENOUS | Status: AC
  Administered 2023-12-14: 10 mL

## 2023-12-14 MED ORDER — HEPARIN SOD (PORK) LOCK FLUSH 100 UNIT/ML IV SOLN
500.0000 [IU] | Freq: Once | INTRAVENOUS | Status: AC
  Administered 2023-12-14: 500 [IU]

## 2023-12-14 NOTE — Progress Notes (Signed)
 [facility] [phone]   [phone]  PROGRESS NOTE  Patient Care Team: Tysinger, Alm RAMAN, PA-C as PCP - General (Family Medicine) Federico Norleen ONEIDA MADISON, MD as Consulting Physician (Hematology and Oncology)  Hematological/Oncological History # Follicular Lymphoma. Stage III 1) 01/07/2020: CT Neck W contrast showed cervical lymphadenopathy bilaterally, left greater than right. Numerous enlarged lymph nodes are present 2)  01/22/2020: US  Core biopsy performed which revealed overall features consistent with involvement by non-Hodgkin's B-cell lymphoma and the morphologic and phenotypic findings favor follicular lymphoma.  3) 01/31/2020: establish care with Dr. Federico  4) 02/12/2020: PET CT scan demonstrates bulky adenopathy in the neck, chest, abdomen and pelvis and diffuse skeletal uptake is suspicious for (but not diagnostic) of marrow involvement. 5) 04/30/2020: Cycle 1 Day 1 of Bendamustine /Rituximab  6) 05/29/2020: Cycle 2 Day 1 of Bendamustine /Rituximab  7) 06/25/2020: Cycle 3 Day 1 of Bendamustine /Rituximab  8) 07/23/2020: Cycle 4 Day 1 of Bendamustine /Rituximab  9) 08/25/2020: Cycle 5 Day 1 of Bendamustine /Rituximab  10)09/17/2020: Cycle 6 Day 1 of Bendamustine /Rituximab  11) 10/26/2020: PET CT scan shows partial response (Deauville Score 3 in lymph nodes but some residual FDG avidity in the bone marrow).  12) 04/27/2021: CT scan showed interval decreased adenopathy below the diaphragm, consistent with treatment response. No new or enlarging suspicious lymph nodes 13) 10/27/2021: CT scan showed increased left inguinal adenopathy, including a 1.6 cm left inguinal lymph node which was previously 0.9 cm in short axis. 14) 04/30/2022: CT scan shows progressive adenopathy below the diaphragm with new thoracic adenopathy and increased hazy retroperitoneal stranding, consistent with worsening lymphomatous disease. 15) 05/05/2022: Doppler US  of lower extremities revealed bilateral  lymph nodes in the groin.   16) 06/10/2022: Echo: EF 60-65% 17) 06/29/2022: Cycle 1 of R-CHOP 18) 07/26/2022: Cycle 2 of R-CHOP 19) 08/17/2022: Cycle 3 of R-CHOP 20) 09/07/2022: Cycle 4 of R-CHOP 21) 09/29/2022: Cycle 5 of R-CHOP 22) 10/27/2022: Cycle 6 of R-CHOP  # Well Differentiated Neuroendocrine Tumor 03/08/2023: Routine colonoscopy performed and patient was found to have a well-differentiated neuroendocrine tumor in the ileum.  Tumor was 0.5 cm with a Ki-67 of less than 1%  Interval History:  Jorge Gould 68 y.o. male with medical history significant for Stage III follicular lymphoma who presents for a follow up visit. The patient's last visit was on 09/14/2023. In the interim since the last visit he had a CT scan which showed apparent metastatic disease to the liver but no progression of lymphadenopathy elsewhere.  On exam today Jorge Gould reports reports that he has been having some dark stools but no overt signs of blood in the stool.  He reports that sometimes his stools can be tarry and sticky.  He reports that he is having some tightness in his abdomen as well but no frank pain.  He is not having any cough but he is having some hoarseness.  He reports that he is not eating like he used to.  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  Overall he has no signs or symptoms concerning for progression of his lymphoma.  A full 10 point ROS was otherwise negative.   The bulk of our discussion focused on the results of his scan of this test moving forward.  Details of our plan noted below.  MEDICAL HISTORY:  Past Medical History:  Diagnosis Date  Cancer (HCC) 2021   follicular lymphoma   Obesity     SURGICAL HISTORY: Past Surgical History:  Procedure Laterality Date   ANKLE FRACTURE SURGERY  2016   right, trauma repair after fall down stairs   HERNIA REPAIR      umbilical   IR IMAGING GUIDED PORT INSERTION  04/09/2020   IR IMAGING GUIDED PORT INSERTION  06/17/2022   IR REMOVAL TUN ACCESS W/ PORT W/O FL MOD SED  11/27/2020   TONSILLECTOMY      SOCIAL HISTORY: Social History   Socioeconomic History   Marital status: Married    Spouse name: Not on file   Number of children: Not on file   Years of education: Not on file   Highest education level: Not on file  Occupational History   Not on file  Tobacco Use   Smoking status: Never   Smokeless tobacco: Never  Vaping Use   Vaping status: Never Used  Substance and Sexual Activity   Alcohol use: Yes    Alcohol/week: 3.0 standard drinks of alcohol    Types: 3 Shots of liquor per week   Drug use: Not Currently   Sexual activity: Not on file  Other Topics Concern   Not on file  Social History Narrative   Engaged, plan to marry in March 2021, been together since 2014.  Construction work.  Owns holiday representative firm.    [employer].   Most exercise on the job, working 7 days per week.   No children, but fiance has 5 children.    11/2019   Social Drivers of Corporate Investment Banker Strain: Not on file  Food Insecurity: Not on file  Transportation Needs: Not on file  Physical Activity: Not on file  Stress: Not on file  Social Connections: Not on file  Intimate Partner Violence: Not on file    FAMILY HISTORY: Family History  Problem Relation Age of Onset   Cancer Mother        lung; former smoker   COPD Father        emphysema   Diabetes Maternal Aunt    Heart disease Maternal Uncle    Heart disease Maternal Uncle    Diabetes Maternal Aunt    Stroke Neg Hx    Hyperlipidemia Neg Hx    Hypertension Neg Hx     ALLERGIES:  has no known allergies.  MEDICATIONS:  Current Outpatient Medications  Medication Sig Dispense Refill   acetaminophen  (TYLENOL ) 500 MG tablet Take 2 tablets (1,000 mg total) by mouth every 6 (six) hours as needed. 30 tablet 0   amoxicillin -clavulanate (AUGMENTIN )  875-125 MG tablet Take 1 tablet by mouth 2 (two) times daily. 20 tablet 0   benzonatate  (TESSALON ) 200 MG capsule Take 1 capsule (200 mg total) by mouth 3 (three) times daily as needed for cough. 30 capsule 0   predniSONE  (DELTASONE ) 20 MG tablet 3 tablets today, 2 tablets tomorrow, 1 tablet the third day 6 tablet 0   valsartan  (DIOVAN ) 40 MG tablet Take 1 tablet (40 mg total) by mouth daily. 30 tablet 2   No current facility-administered medications for this visit.   Facility-Administered Medications Ordered in Other Visits  Medication Dose Route Frequency Provider Last Rate Last Admin   sodium chloride  flush (NS) 0.9 % injection 10 mL  10 mL Intracatheter PRN Federico Norleen DASEN IV, MD   10 mL at 10/27/22 1750    REVIEW OF SYSTEMS:   Constitutional: ( - ) fevers, ( - )  chills , ( - ) night sweats Eyes: ( - ) blurriness of vision, ( - ) double vision, ( - ) watery eyes Ears, nose, mouth, throat, and face: ( - ) mucositis, ( - ) sore throat Respiratory: ( - ) cough, ( - ) dyspnea, ( - ) wheezes Cardiovascular: ( - ) palpitation, ( - ) chest discomfort, ( - ) lower extremity swelling Gastrointestinal:  ( - ) nausea, ( - ) heartburn, ( - ) change in bowel habits Skin: ( - ) abnormal skin rashes Lymphatics: ( - ) new lymphadenopathy, ( - ) easy bruising Neurological: ( - ) numbness, ( - ) tingling, ( - ) new weaknesses Behavioral/Psych: ( - ) mood change, ( - ) new changes  All other systems were reviewed with the patient and are negative.  PHYSICAL EXAMINATION: ECOG PERFORMANCE STATUS: 1 - Symptomatic but completely ambulatory  Vitals:   12/14/23 1050  BP: 116/81  Pulse: 66  Resp: 13  Temp: 97.6 F (36.4 C)  SpO2: 98%     Filed Weights   12/14/23 1050  Weight: 235 lb 1.6 oz (106.6 kg)      GENERAL: well appearing middle aged Caucasian male in NAD  SKIN: skin color, texture, turgor are normal, no rashes or significant lesions EYES: conjunctiva are pink and non-injected,  sclera clear LUNGS: clear to auscultation and percussion with normal breathing effort HEART: regular rate & rhythm and no murmurs and +1 pitting edema in lower extremity bilaterally.  Musculoskeletal: no cyanosis of digits and no clubbing  PSYCH: alert & oriented x 3, fluent speech NEURO: no focal motor/sensory deficits  LABORATORY DATA:  I have reviewed the data as listed    Latest Ref Rng & Units 12/14/2023   10:14 AM 12/08/2023   11:44 AM 09/14/2023   10:22 AM  CBC  WBC 4.0 - 10.5 K/uL 11.7  6.9  5.5   Hemoglobin 13.0 - 17.0 g/dL 89.8  88.9  86.3   Hematocrit 39.0 - 52.0 % 31.0  34.2  39.0   Platelets 150 - 400 K/uL 403  385  245        Latest Ref Rng & Units 12/14/2023   10:14 AM 12/08/2023   11:44 AM 09/14/2023   10:22 AM  CMP  Glucose 70 - 99 mg/dL 787  90  91   BUN 8 - 23 mg/dL 22  14  18    Creatinine 0.61 - 1.24 mg/dL 8.76  8.96  9.10   Sodium 135 - 145 mmol/L 135  141  138   Potassium 3.5 - 5.1 mmol/L 4.3  4.7  4.0   Chloride 98 - 111 mmol/L 100  99  105   CO2 22 - 32 mmol/L 28  21  26    Calcium  8.9 - 10.3 mg/dL 9.3  9.3  9.7   Total Protein 6.5 - 8.1 g/dL 6.9  6.7  7.0   Total Bilirubin 0.0 - 1.2 mg/dL 0.9  0.6  0.7   Alkaline Phos 38 - 126 U/L 357  380  86   AST 15 - 41 U/L 60  62  15   ALT 0 - 44 U/L 44  43  14    RADIOGRAPHIC STUDIES: I have personally reviewed the radiological images as listed and agreed with the findings in the report: Marked response in the patient's lymphadenopathy and decrease in his FDG avidity.  Bone marrow involvement also appears considerably less.  Overall consistent with a partial response.  CT  CHEST ABDOMEN PELVIS W CONTRAST Result Date: 12/05/2023 CLINICAL DATA:  Hematologic malignancy, assess treatment response EXAM: CT CHEST, ABDOMEN, AND PELVIS WITH CONTRAST TECHNIQUE: Multidetector CT imaging of the chest, abdomen and pelvis was performed following the standard protocol during bolus administration of intravenous contrast. RADIATION  DOSE REDUCTION: This exam was performed according to the departmental dose-optimization program which includes automated exposure control, adjustment of the mA and/or kV according to patient size and/or use of iterative reconstruction technique. CONTRAST:  OMNIPAQUE  IOHEXOL  300 MG/ML  SOLN COMPARISON:  CT chest abdomen and pelvis 06/10/2023. FINDINGS: CT CHEST FINDINGS Cardiovascular: Right chest wall infusion port with the distal tip at the superior cavoatrial junction. No acute vascular abnormality. Normal heart size. No pericardial effusion. Mediastinum/Nodes: No enlarged mediastinal, hilar, or axillary lymph nodes. Thyroid  gland, trachea, and esophagus demonstrate no significant findings. Lungs/Pleura: Lungs are clear. No pleural effusion or pneumothorax. Musculoskeletal: No chest wall mass or suspicious bone lesions identified. CT ABDOMEN PELVIS FINDINGS Hepatobiliary: Numerous new hypoattenuating hepatic lesions, the largest measuring up to 10 cm in the left lobe (2:58). A 7 mm filling defect in the proximal right portal vein (2:62) may represent thrombus or tumor invasion. Normal gallbladder. No biliary ductal dilation. Pancreas: Unremarkable. No pancreatic ductal dilatation or surrounding inflammatory changes. Spleen: Normal in size without focal abnormality. Adrenals/Urinary Tract: Adrenal glands are unremarkable. Kidneys are normal, without renal calculi, focal lesion, or hydronephrosis. Bladder is unremarkable. Stomach/Bowel: Stomach is within normal limits. Appendix appears normal. No evidence of bowel wall thickening, distention, or inflammatory changes. Vascular/Lymphatic: Aortic atherosclerosis. Multiple enlarged lymph nodes in the retroperitoneal, periaortic, and inguinal regions. A left periaortic node measuring 3.2 x 2.3 cm (2:76) as well as a dominant 3 cm left inguinal node (2:130), appear similar from prior. Reproductive: Prostatomegaly. Other: No abdominal wall hernia or abnormality. No  abdominopelvic ascites. Musculoskeletal: No acute or significant osseous findings. IMPRESSION: 1. Multiple new hepatic lesions highly suggestive for metastatic disease. 2. Subcentimeter filling defect in the proximal right portal vein may represent artifact, nonocclusive thrombus, or tumor invasion. 3. Overall stable multi station lymphadenopathy as described. A communications note was sent to the ordering provider's office on 12/05/2023 at 15:00 Electronically Signed   By: Gwynneth Akin M.D.   On: 12/05/2023 15:09    ASSESSMENT & PLAN Jorge Gould 68 y.o. male with medical history significant for diagnosed follicular lymphoma who presents for a follow up visit.   He completed 6 cycles of Bendamustine /Rituximab  therapy from 04/30/2020-09/17/2020. Repeat PET CT scan on 10/26/2020 showed a partial response. OK to enter observation.   CT scan from 04/30/2022 and doppler US  from 05/05/2022 showed enlarging adenopathy. Recommended second line therapy with R-CHOP. Regimen consists of Rituximab  375 mg/m2, cytoxan  750 mg/m2, vincristine  2 mg IV, adriamycin  50 mg/m2 along with prednisone  60 mg PO on days 1-5. This regimen is given every 21 days.   # Follicular Lymphoma. Stage III --patient completed Cycle 6 of R-Benda therapy. This was administered with curative intent.  --PET CT scan after 6 cycles of R-Benda showed substantial partial response. Residual mild hypermetabolism within the retroperitoneal and left common iliac chains (Deauville score 3). No residual metabolically active adenopathy in the neck or chest --per Lugano Criteria, patient is a Partial Response.  --CT neck and CAP from 04/30/2022 showed progressive adenopathy.  --Doppler  US  from 05/05/2022 showed progressive inguinal adenopathy, bilateral. --Recommend systemic therapy with R-CHOP started on 06/29/2022 ----Baseline ECHO from 06/10/2022 shows EF 60-65% Plan:  --patient completed 6 cycles  of R-CHOP --Labs from today were reviewed and  show white blood cell 11.7, hemoglobin 10.1, MCV 79.1, platelets 403 -- LFTs show AST 60, alkaline phosphatase 357 -- Findings appear to show stable lymphadenopathy but increased metastatic disease to the liver.  Will order liver biopsy in order to assure this is not transformation to a more aggressive lymphoma or a second primary --RTC pending the results of the biopsy.  #Anemia #Dark Stools -- Most likely to be connected to his apparent progression of disease, though GI bleed cannot be ruled out given his dark stools. -- Will make Dr. Rollin aware of our findings.  # Well Differentiated Neuroendocrine Tumor -- Continue to monitor in conjunction with his follicular lymphoma. -- No elevations in chromogranin or evidence of more disease on PET dotatate  # Left Lower Leg Swelling, improved --Patient has no more redness and +1 pitting edema of left lower extremity. Swelling L>R --Doppler US  from 05/05/2022 showed no evidence of DVT. There are enlarged lymph nodes noted in the groin, bilaterally which is likely causing the swelling.   #Supportive Care -- port placed -- zofran  8mg  q8H PRN and compazine  10mg  PO q6H for nausea -- EMLA  cream for port -- no pain medication required at this time.   Orders Placed This Encounter  Procedures   US  BIOPSY (LIVER)    Standing Status:   Future    Expected Date:   12/21/2023    Expiration Date:   12/13/2024    Lab orders requested (DO NOT place separate lab orders, these will be automatically ordered during procedure specimen collection)::   Surgical Pathology    Reason for Exam (SYMPTOM  OR DIAGNOSIS REQUIRED):   liver lesions, concerning for metastatic disease    Preferred location?:   [facility]    All questions were answered. The patient knows to call the clinic with any problems, questions or concerns.  I have spent a total of 30 minutes minutes of face-to-face and non-face-to-face time, preparing to see the patient,  performing a  medically appropriate examination, counseling and educating the patient, ordering medications, documenting clinical information in the electronic health record, and care coordination.   Norleen IVAR Kidney, MD Department of Hematology/Oncology [facility] Phone: [phone] Pager: [phone] Email: [email or web address]  12/14/2023 7:16 PM

## 2023-12-14 NOTE — Progress Notes (Signed)
 Jorge Sieving, Jorge Gould  Jorge Gould PROCEDURE / BIOPSY REVIEW Date: 12/14/23  Requested Biopsy site: liver Reason for request: mets h/o lymphoma Imaging review: Best seen on CT 12/05/23  Decision: Approved Imaging modality to perform: Ultrasound Schedule with: Moderate Sedation Schedule for: Any VIR  Additional comments:   Please contact me with questions, concerns, or if issue pertaining to this request arise.  Jorge Gould Johann, Jorge Gould Jorge Gould       Previous Messages    ----- Message ----- From: Jorge Gould Sent: 12/14/2023  11:34 AM EST To: Jorge Gould; Jorge Gould Subject: CT Biopsy                                      Procedure:  CT Biopsy  Reason: liver lesions, concerning for metastatic disease Dx: Follicular lymphoma grade IIIa of intra-abdominal lymph nodes (HCC) [C82.33 (ICD-10-CM)]    History : CT Chest Abd pelv w/  Provider : Federico Norleen ONEIDA MADISON, Jorge Gould  Provider contact : [phone]

## 2023-12-15 ENCOUNTER — Other Ambulatory Visit: Payer: Self-pay

## 2023-12-15 ENCOUNTER — Telehealth: Payer: Self-pay | Admitting: *Deleted

## 2023-12-15 ENCOUNTER — Other Ambulatory Visit: Payer: Self-pay | Admitting: *Deleted

## 2023-12-15 DIAGNOSIS — C8233 Follicular lymphoma grade IIIa, intra-abdominal lymph nodes: Secondary | ICD-10-CM

## 2023-12-15 LAB — IRON AND IRON BINDING CAPACITY (CC-WL,HP ONLY)
Iron: 17 ug/dL — ABNORMAL LOW (ref 45–182)
Saturation Ratios: 5 % — ABNORMAL LOW (ref 17.9–39.5)
TIBC: 347 ug/dL (ref 250–450)
UIBC: 330 ug/dL (ref 117–376)

## 2023-12-15 LAB — FERRITIN: Ferritin: 137 ng/mL (ref 24–336)

## 2023-12-15 NOTE — Telephone Encounter (Signed)
 TCT patient regarding recent lab results. Spoke with him. Advised that his iron saturation is quite low. Dr. Federico suspects he is having a GI bleed. He has  reached out to Dr. Rollin in GI who should be calling him to schedule appointment soon.  Jorge Gould voiced understanding.He is aware of his liver biopsy on 12/20/23 as well.

## 2023-12-15 NOTE — Telephone Encounter (Signed)
-----   Message from Norleen ONEIDA Kidney IV sent at 12/15/2023  3:26 PM EST ----- Please let Jorge Gould know that his iron saturation is quite low.  I do suspect he is having a GI bleed.  I have reached out to Dr. Rollin in GI who should be calling him to schedule appointment soon. ----- Message ----- From: Rebecka, Lab In [place] Sent: 12/15/2023   9:31 AM EST To: Norleen ONEIDA Kidney MADISON, MD

## 2023-12-17 ENCOUNTER — Emergency Department (HOSPITAL_BASED_OUTPATIENT_CLINIC_OR_DEPARTMENT_OTHER): Payer: Medicare Other

## 2023-12-17 ENCOUNTER — Other Ambulatory Visit: Payer: Self-pay

## 2023-12-17 ENCOUNTER — Emergency Department (HOSPITAL_BASED_OUTPATIENT_CLINIC_OR_DEPARTMENT_OTHER)
Admission: EM | Admit: 2023-12-17 | Discharge: 2023-12-18 | Disposition: A | Discharge status: Home / Self Care | Payer: Medicare Other | Attending: Emergency Medicine | Admitting: Emergency Medicine

## 2023-12-17 DIAGNOSIS — R0602 Shortness of breath: Secondary | ICD-10-CM | POA: Diagnosis not present

## 2023-12-17 DIAGNOSIS — Z20822 Contact with and (suspected) exposure to covid-19: Secondary | ICD-10-CM | POA: Insufficient documentation

## 2023-12-17 DIAGNOSIS — R1011 Right upper quadrant pain: Secondary | ICD-10-CM | POA: Diagnosis not present

## 2023-12-17 DIAGNOSIS — R509 Fever, unspecified: Secondary | ICD-10-CM | POA: Insufficient documentation

## 2023-12-17 DIAGNOSIS — R1010 Upper abdominal pain, unspecified: Secondary | ICD-10-CM

## 2023-12-17 DIAGNOSIS — R1084 Generalized abdominal pain: Secondary | ICD-10-CM | POA: Diagnosis not present

## 2023-12-17 DIAGNOSIS — E871 Hypo-osmolality and hyponatremia: Secondary | ICD-10-CM | POA: Diagnosis not present

## 2023-12-17 DIAGNOSIS — C787 Secondary malignant neoplasm of liver and intrahepatic bile duct: Secondary | ICD-10-CM

## 2023-12-17 DIAGNOSIS — R531 Weakness: Secondary | ICD-10-CM | POA: Diagnosis not present

## 2023-12-17 DIAGNOSIS — R109 Unspecified abdominal pain: Secondary | ICD-10-CM | POA: Diagnosis not present

## 2023-12-17 LAB — COMPREHENSIVE METABOLIC PANEL
ALT: 75 U/L — ABNORMAL HIGH (ref 0–44)
AST: 110 U/L — ABNORMAL HIGH (ref 15–41)
Albumin: 3.7 g/dL (ref 3.5–5.0)
Alkaline Phosphatase: 371 U/L — ABNORMAL HIGH (ref 38–126)
Anion gap: 4 — ABNORMAL LOW (ref 5–15)
BUN: 16 mg/dL (ref 8–23)
CO2: 33 mmol/L — ABNORMAL HIGH (ref 22–32)
Calcium: 9.3 mg/dL (ref 8.9–10.3)
Chloride: 97 mmol/L — ABNORMAL LOW (ref 98–111)
Creatinine, Ser: 1.23 mg/dL (ref 0.61–1.24)
GFR, Estimated: >60 mL/min (ref >60)
Glucose, Bld: 124 mg/dL — ABNORMAL HIGH (ref 70–99)
Potassium: 4.5 mmol/L (ref 3.5–5.1)
Sodium: 134 mmol/L — ABNORMAL LOW (ref 135–145)
Total Bilirubin: 1.8 mg/dL — ABNORMAL HIGH (ref 0.0–1.2)
Total Protein: 6.9 g/dL (ref 6.5–8.1)

## 2023-12-17 LAB — CBC
HCT: 32.1 % — ABNORMAL LOW (ref 39.0–52.0)
Hemoglobin: 10.1 g/dL — ABNORMAL LOW (ref 13.0–17.0)
MCH: 25.4 pg — ABNORMAL LOW (ref 26.0–34.0)
MCHC: 31.5 g/dL (ref 30.0–36.0)
MCV: 80.7 fL (ref 80.0–100.0)
Platelets: 442 10*3/uL — ABNORMAL HIGH (ref 150–400)
RBC: 3.98 MIL/uL — ABNORMAL LOW (ref 4.22–5.81)
RDW: 14.6 % (ref 11.5–15.5)
WBC: 11.8 10*3/uL — ABNORMAL HIGH (ref 4.0–10.5)
nRBC: 0 % (ref 0.0–0.2)

## 2023-12-17 LAB — D-DIMER, QUANTITATIVE: D-Dimer, Quant: 14.94 ug{FEU}/mL — ABNORMAL HIGH (ref 0.00–0.50)

## 2023-12-17 LAB — RESP PANEL BY RT-PCR (RSV, FLU A&B, COVID)  RVPGX2
Influenza A by PCR: NEGATIVE
Influenza B by PCR: NEGATIVE
Resp Syncytial Virus by PCR: NEGATIVE
SARS Coronavirus 2 by RT PCR: NEGATIVE

## 2023-12-17 LAB — LIPASE, BLOOD: Lipase: <10 U/L — ABNORMAL LOW (ref 11–51)

## 2023-12-17 MED ORDER — IOHEXOL 350 MG/ML SOLN
100.0000 mL | Freq: Once | INTRAVENOUS | Status: AC | PRN
  Administered 2023-12-18: 100 mL via INTRAVENOUS

## 2023-12-17 NOTE — ED Triage Notes (Signed)
 Pt BIB [facility] reporting SOB that has gotten progressively worse, seen on 1/30 for same, prescribed augmentin  and prednisone . Also reporting abd pain x1 month, hx stage 3 lymphoma, liver biopsy scheduled for Tuesday.

## 2023-12-17 NOTE — ED Provider Notes (Signed)
  Physical Exam  BP 113/68   Pulse 78   Temp 98.9 F (37.2 C)   Resp 20   Ht 6' (1.829 m)   Wt 106.6 kg   SpO2 100%   BMI 31.87 kg/m   Physical Exam  Procedures  Procedures  ED Course / MDM    Medical Decision Making Amount and/or Complexity of Data Reviewed Labs: ordered. Radiology: ordered.  Risk Prescription drug management.   68 year old male with medical history significant for lymphoma presenting to the emerged part with shortness of breath.  Patient pending CT imaging of the chest for PE study as well as abdomen pelvis given his abdominal pain.  Symptoms of dyspnea have been ongoing for the past month.  Last echocardiogram in August 2023 revealed a normal EF.   CTA PE: IMPRESSION:  No evidence of pulmonary embolus.    No acute cardiopulmonary disease.    Coronary artery disease.    Aortic Atherosclerosis (ICD10-I70.0).   CT Abdomen Pelvis: IMPRESSION:  Continued numerous large masses throughout the liver compatible with  metastases, unchanged since recent study.    Unchanged multi station lymphadenopathy.    Aortic atherosclerosis.    Patient with manageable pain on repeat assessment, comfortable with the plan for discharge and outpatient follow-up.    Jerrol Agent, MD 12/18/23 0157

## 2023-12-17 NOTE — ED Notes (Signed)
 Pt. States some mild pain in mid abdomen, but mostly feels tired. Hasn't contacted his oncologist.

## 2023-12-17 NOTE — ED Provider Notes (Signed)
 [place] EMERGENCY DEPARTMENT AT [facility] Provider Note   CSN: [id number] Arrival date & time: 12/17/23  2136     History  Chief Complaint  Patient presents with   Shortness of Breath    Jorge Gould is a 68 y.o. male.  With a history of lymphoma presenting to the ED for evaluation of shortness of breath.  Symptoms began approximately 1 month ago and have persisted.  Symptoms are typically present with ambulation and improved with rest.  He denies any chest pain.  No cough, congestion, rhinorrhea.  He does report a fever today.  He has abdominal pain at baseline.  He denies known sick contacts.  He was started on Augmentin  and prednisone  for his symptoms approximately 8 days ago with no improvement.   Shortness of Breath      Home Medications Prior to Admission medications   Medication Sig Start Date End Date Taking? Authorizing Provider  acetaminophen  (TYLENOL ) 500 MG tablet Take 2 tablets (1,000 mg total) by mouth every 6 (six) hours as needed. 04/21/22   Enedelia Dorna HERO, FNP  amoxicillin -clavulanate (AUGMENTIN ) 875-125 MG tablet Take 1 tablet by mouth 2 (two) times daily. 12/13/23   Tysinger, Alm RAMAN, PA-C  benzonatate  (TESSALON ) 200 MG capsule Take 1 capsule (200 mg total) by mouth 3 (three) times daily as needed for cough. 12/13/23   Tysinger, Alm RAMAN, PA-C  predniSONE  (DELTASONE ) 20 MG tablet 3 tablets today, 2 tablets tomorrow, 1 tablet the third day 12/13/23   Tysinger, Alm RAMAN, PA-C  valsartan  (DIOVAN ) 40 MG tablet Take 1 tablet (40 mg total) by mouth daily. 12/13/23 12/12/24  Tysinger, Alm RAMAN, PA-C      Allergies    Patient has no known allergies.    Review of Systems   Review of Systems  Respiratory:  Positive for shortness of breath.   All other systems reviewed and are negative.   Physical Exam Updated Vital Signs BP 113/68   Pulse 78   Temp 98.9 F (37.2 C)   Resp 20   Ht 6' (1.829 m)   Wt 106.6 kg   SpO2 100%   BMI 31.87 kg/m  Physical  Exam Vitals and nursing note reviewed.  Constitutional:      General: He is not in acute distress.    Appearance: Normal appearance. He is normal weight. He is not ill-appearing.     Comments: Resting comfortably in bed  HENT:     Head: Normocephalic and atraumatic.  Cardiovascular:     Rate and Rhythm: Normal rate and regular rhythm.  Pulmonary:     Effort: Pulmonary effort is normal. No respiratory distress.     Breath sounds: No decreased breath sounds, wheezing, rhonchi or rales.  Chest:     Chest wall: No tenderness.  Abdominal:     General: Abdomen is flat.  Musculoskeletal:        General: Normal range of motion.     Cervical back: Neck supple.  Skin:    General: Skin is warm and dry.  Neurological:     Mental Status: He is alert and oriented to person, place, and time.  Psychiatric:        Mood and Affect: Mood normal.        Behavior: Behavior normal.     ED Results / Procedures / Treatments   Labs (all labs ordered are listed, but only abnormal results are displayed) Labs Reviewed  LIPASE, BLOOD - Abnormal; Notable for the following components:  Result Value   Lipase <10 (*)    All other components within normal limits  COMPREHENSIVE METABOLIC PANEL - Abnormal; Notable for the following components:   Sodium 134 (*)    Chloride 97 (*)    CO2 33 (*)    Glucose, Bld 124 (*)    AST 110 (*)    ALT 75 (*)    Alkaline Phosphatase 371 (*)    Total Bilirubin 1.8 (*)    Anion gap 4 (*)    All other components within normal limits  CBC - Abnormal; Notable for the following components:   WBC 11.8 (*)    RBC 3.98 (*)    Hemoglobin 10.1 (*)    HCT 32.1 (*)    MCH 25.4 (*)    Platelets 442 (*)    All other components within normal limits  D-DIMER, QUANTITATIVE - Abnormal; Notable for the following components:   D-Dimer, Quant 14.94 (*)    All other components within normal limits  RESP PANEL BY RT-PCR (RSV, FLU A&B, COVID)  [id number]  URINALYSIS, ROUTINE W  REFLEX MICROSCOPIC  TROPONIN I (HIGH SENSITIVITY)  TROPONIN I (HIGH SENSITIVITY)    EKG None  Radiology DG Chest Port 1 View Result Date: 12/17/2023 CLINICAL DATA:  Worsening shortness of breath. EXAM: PORTABLE CHEST 1 VIEW COMPARISON:  April 21, 2022 FINDINGS: A right-sided venous Port-A-Cath is in place, with its distal tip seen at the junction of the superior vena cava and right atrium. The heart size and mediastinal contours are within normal limits. Both lungs are clear. The visualized skeletal structures are unremarkable. IMPRESSION: No active disease. Electronically Signed   By: Suzen Dials M.D.   On: 12/17/2023 22:22    Procedures Procedures    Medications Ordered in ED Medications  iohexol  (OMNIPAQUE ) 350 MG/ML injection 100 mL (has no administration in time range)    ED Course/ Medical Decision Making/ A&P                                 Medical Decision Making Amount and/or Complexity of Data Reviewed Labs: ordered. Radiology: ordered.  This patient presents to the ED for concern of shortness of breath, this involves an extensive number of treatment options, and is a complaint that carries with it a high risk of complications and morbidity. The emergent differential diagnosis for shortness of breath includes, but is not limited to, Pulmonary edema, bronchoconstriction, Pneumonia, Pulmonary embolism, Pneumotherax/ Hemothorax, Dysrythmia, ACS.    My initial workup includes labs, imaging, EKG  Additional history obtained from: Nursing notes from this visit. Family at bedside  I ordered, reviewed and interpreted labs which include: CMP, CBC, lipase, troponin, D-dimer, respiratory panel.  Leukocytosis of 11.8.  Anemia with a hemoglobin of 10.1.  Thrombocytosis of 442.  Mild hyponatremia 134, hypochloremia of 97, bicarb markedly elevated to 33, hyperglycemia 124.  LFTs abnormal with an AST of 110, ALT of 75, alk phos of 371, total bilirubin of 1.8.  Bilirubin doubled  from 3 days ago.  Lipase negative.  Respiratory panel negative.  D-dimer elevated.  I ordered imaging studies including chest x-ray, CT PE study, CT abdomen pelvis I independently visualized and interpreted imaging which showed pending at shift change I agree with the radiologist interpretation  Cardiac Monitoring:  The patient was maintained on a cardiac monitor.  I personally viewed and interpreted the cardiac monitored which showed an underlying rhythm of: NSR  Afebrile, hemodynamically stable.  68 year old male presenting to the ED for evaluation of shortness of breath.  This is exertional.  Symptoms began 1 month ago.  He appears well on physical exam.  He is nontachypneic, nontachycardic, nonhypoxic.  Lab workup as stated above.  Plan at time of shift changes to reassess after imaging.  Disposition pending results of imaging.  Care handed off to oncoming provider.  Please see their note for final disposition and decision making.  Plan may change at the discretion of the oncoming provider.  Note: Portions of this report may have been transcribed using voice recognition software. Every effort was made to ensure accuracy; however, inadvertent computerized transcription errors may still be present.        Final Clinical Impression(s) / ED Diagnoses Final diagnoses:  None    Rx / DC Orders ED Discharge Orders     None         Edwardo Marsa HERO, DEVONNA 12/17/23 2358    Dreama Longs, MD 12/20/23 1044

## 2023-12-18 DIAGNOSIS — K4090 Unilateral inguinal hernia, without obstruction or gangrene, not specified as recurrent: Secondary | ICD-10-CM | POA: Diagnosis not present

## 2023-12-18 DIAGNOSIS — I700 Atherosclerosis of aorta: Secondary | ICD-10-CM | POA: Diagnosis not present

## 2023-12-18 DIAGNOSIS — I517 Cardiomegaly: Secondary | ICD-10-CM | POA: Diagnosis not present

## 2023-12-18 DIAGNOSIS — R109 Unspecified abdominal pain: Secondary | ICD-10-CM | POA: Diagnosis not present

## 2023-12-18 DIAGNOSIS — R160 Hepatomegaly, not elsewhere classified: Secondary | ICD-10-CM | POA: Diagnosis not present

## 2023-12-18 DIAGNOSIS — I2510 Atherosclerotic heart disease of native coronary artery without angina pectoris: Secondary | ICD-10-CM | POA: Diagnosis not present

## 2023-12-18 DIAGNOSIS — R590 Localized enlarged lymph nodes: Secondary | ICD-10-CM | POA: Diagnosis not present

## 2023-12-18 LAB — URINALYSIS, ROUTINE W REFLEX MICROSCOPIC
Bacteria, UA: NONE SEEN
Bilirubin Urine: NEGATIVE
Glucose, UA: NEGATIVE mg/dL
Hgb urine dipstick: NEGATIVE
Ketones, ur: NEGATIVE mg/dL
Leukocytes,Ua: NEGATIVE
Nitrite: NEGATIVE
Protein, ur: 100 mg/dL — AB
Specific Gravity, Urine: >1.046 — ABNORMAL HIGH (ref 1.005–1.030)
pH: 6.5 (ref 5.0–8.0)

## 2023-12-18 NOTE — Discharge Instructions (Addendum)
 Your CT Abd Pelvis Results: IMPRESSION:  Continued numerous large masses throughout the liver compatible with  metastases, unchanged since recent study.    Unchanged multi station lymphadenopathy.    Aortic atherosclerosis.   Follow-up outpatient for biopsy of your liver masses, continue with outpatient pain control, return for any severe worsening symptoms.

## 2023-12-18 NOTE — ED Notes (Signed)
 RN reviewed discharge instructions with pt. Pt verbalized understanding and had no further questions. VSS upon discharge.

## 2023-12-19 ENCOUNTER — Inpatient Hospital Stay: Payer: Medicare Other

## 2023-12-19 ENCOUNTER — Telehealth: Payer: Self-pay | Admitting: *Deleted

## 2023-12-19 ENCOUNTER — Other Ambulatory Visit: Payer: Self-pay | Admitting: Radiology

## 2023-12-19 DIAGNOSIS — K921 Melena: Secondary | ICD-10-CM | POA: Diagnosis not present

## 2023-12-19 DIAGNOSIS — K769 Liver disease, unspecified: Secondary | ICD-10-CM

## 2023-12-19 DIAGNOSIS — D509 Iron deficiency anemia, unspecified: Secondary | ICD-10-CM | POA: Diagnosis not present

## 2023-12-19 DIAGNOSIS — R1033 Periumbilical pain: Secondary | ICD-10-CM | POA: Diagnosis not present

## 2023-12-19 NOTE — Telephone Encounter (Signed)
 Received message from SW to call Mr. Clinch's wife.SW states the wife is very worried about her husband as he is not eating, having increased RUQ pain. SW asked that I call pt's wife. TCT [employer].  Spoke with her. She states she is very worried about Mike-he is not eating and is having more RUG pain. She is concerned that he is feeling so bad that he won't go for his biopsy tomorrow morning. Asked Doris if I can call and speak with Athena Bland. She said yes-in about 10-15 minutes. Meanwhile-TCT Dr. Cleora Daft office about pt's f/u appt with Dr. Nickey Barn. Pt was actually seen there this morning. Spoke with Dr. Nickey Barn. He states he plans to do EGD either next Tuesday or if there is a cancellation on this Thursday, 12/22/23. He is hopeful that here will be a cancellation. TCT Athena Bland and spoke with him.  He states he feels pretty rough right now with RUQ pain and no appetite. Explained the reasons for his abdominal pain -liver capsular stretch. Advised normally we treat that with Ibuprofen but because of a possible GI bleed, we cannot do that until after his EGD, hopefully this week. Advised to eat small amounts of food,more often during the day and eat lighter foods for now-soup, crackers, applesauce, fluids. Athena Bland voiced understanding. He states he will go for his biopsy tomorrow as scheduled and is hopeful for his EGD on Thursday. Advised to call here if he needs anything. He said he would.

## 2023-12-19 NOTE — Progress Notes (Signed)
 [facility] Clinical Social Work  Clinical Social Work was referred by Child psychotherapist for assessment of psychosocial needs.  Clinical Social Worker contacted caregiver by phone to offer support and assess for needs.  CSW spoke with patient's spouse and discussed CSW role. [name] spouse discussed decline in patient's status since Friday and increased pain (CSW relayed message to medical team). [name] spouse discussed emotional impact of cancer. CSW has F/U Phone Call Scheduled.   Maudie Sorrow, LCSW  Clinical Social Worker [facility]

## 2023-12-20 ENCOUNTER — Other Ambulatory Visit: Payer: Self-pay

## 2023-12-20 ENCOUNTER — Encounter (HOSPITAL_COMMUNITY): Payer: Self-pay

## 2023-12-20 ENCOUNTER — Ambulatory Visit (HOSPITAL_COMMUNITY)
Admission: RE | Admit: 2023-12-20 | Discharge: 2023-12-20 | Disposition: A | Discharge status: Home / Self Care | Payer: Medicare Other | Source: Ambulatory Visit | Attending: Hematology and Oncology | Admitting: Hematology and Oncology

## 2023-12-20 DIAGNOSIS — C8233 Follicular lymphoma grade IIIa, intra-abdominal lymph nodes: Secondary | ICD-10-CM

## 2023-12-20 DIAGNOSIS — K769 Liver disease, unspecified: Secondary | ICD-10-CM

## 2023-12-20 DIAGNOSIS — C787 Secondary malignant neoplasm of liver and intrahepatic bile duct: Secondary | ICD-10-CM | POA: Insufficient documentation

## 2023-12-20 DIAGNOSIS — Z9221 Personal history of antineoplastic chemotherapy: Secondary | ICD-10-CM | POA: Insufficient documentation

## 2023-12-20 DIAGNOSIS — Z8572 Personal history of non-Hodgkin lymphomas: Secondary | ICD-10-CM | POA: Insufficient documentation

## 2023-12-20 DIAGNOSIS — R160 Hepatomegaly, not elsewhere classified: Secondary | ICD-10-CM | POA: Diagnosis not present

## 2023-12-20 LAB — PROTIME-INR
INR: 1.3 — ABNORMAL HIGH (ref 0.8–1.2)
Prothrombin Time: 16.3 s — ABNORMAL HIGH (ref 11.4–15.2)

## 2023-12-20 LAB — CBC
HCT: 31.7 % — ABNORMAL LOW (ref 39.0–52.0)
Hemoglobin: 10.1 g/dL — ABNORMAL LOW (ref 13.0–17.0)
MCH: 25.4 pg — ABNORMAL LOW (ref 26.0–34.0)
MCHC: 31.9 g/dL (ref 30.0–36.0)
MCV: 79.6 fL — ABNORMAL LOW (ref 80.0–100.0)
Platelets: 380 10*3/uL (ref 150–400)
RBC: 3.98 MIL/uL — ABNORMAL LOW (ref 4.22–5.81)
RDW: 14.7 % (ref 11.5–15.5)
WBC: 11.8 10*3/uL — ABNORMAL HIGH (ref 4.0–10.5)
nRBC: 0 % (ref 0.0–0.2)

## 2023-12-20 MED ORDER — FENTANYL CITRATE (PF) 100 MCG/2ML IJ SOLN
INTRAMUSCULAR | Status: AC
  Filled 2023-12-20: qty 2

## 2023-12-20 MED ORDER — LIDOCAINE HCL (PF) 1 % IJ SOLN
8.0000 mL | Freq: Once | INTRAMUSCULAR | Status: AC
Start: 2023-12-20 — End: 2023-12-20
  Administered 2023-12-20: 8 mL via INTRADERMAL

## 2023-12-20 MED ORDER — SODIUM CHLORIDE 0.9 % IV SOLN: INTRAVENOUS | Status: DC

## 2023-12-20 MED ORDER — FENTANYL CITRATE (PF) 100 MCG/2ML IJ SOLN
INTRAMUSCULAR | Status: AC | PRN
  Administered 2023-12-20: 50 ug via INTRAVENOUS

## 2023-12-20 MED ORDER — MIDAZOLAM HCL 2 MG/2ML IJ SOLN
INTRAMUSCULAR | Status: AC | PRN
  Administered 2023-12-20: 1 mg via INTRAVENOUS

## 2023-12-20 MED ORDER — MIDAZOLAM HCL 2 MG/2ML IJ SOLN
INTRAMUSCULAR | Status: AC
  Filled 2023-12-20: qty 2

## 2023-12-20 NOTE — Procedures (Signed)
Interventional Radiology Procedure Note  Procedure: US Guided Biopsy of left lobe liver mass  Complications: None  Estimated Blood Loss: < 10 mL  Findings: 18 G core biopsy of left lobe liver lesion performed under US guidance.  Five core samples obtained and sent to Pathology.  Jodi Marble. Fredia Sorrow, M.D Pager:  [phone]

## 2023-12-20 NOTE — H&P (Signed)
 Chief Complaint: liver lesion - image guided liver lesion biopsy   Referring Provider(s): Jeanie Sewer   Supervising Physician: Irish Lack  Patient Status: [facility] - In-pt  History of Present Illness: Jorge Gould is a 68 y.o. male with a history of stage III follicular lymphoma diagnosed in March of 2021.  He has undergone 2 trials of chemotherapy with his most recent completed on 10/27/22.  A CT abdomen pelvis was obtained 12/05/23 and revealed multiple new hepatic lesions highly suggestive for metastatic disease. Interventional radiology was consulted to perform an image guided liver lesion biopsy.    Patient is Full Code for procedure   Past Medical History:  Diagnosis Date   Cancer (HCC) 2021   follicular lymphoma   Obesity     Past Surgical History:  Procedure Laterality Date   ANKLE FRACTURE SURGERY  2016   right, trauma repair after fall down stairs   HERNIA REPAIR     umbilical   IR IMAGING GUIDED PORT INSERTION  04/09/2020   IR IMAGING GUIDED PORT INSERTION  06/17/2022   IR REMOVAL TUN ACCESS W/ PORT W/O FL MOD SED  11/27/2020   TONSILLECTOMY      Allergies: Patient has no known allergies.  Medications: Prior to Admission medications   Medication Sig Start Date End Date Taking? Authorizing Provider  acetaminophen (TYLENOL) 500 MG tablet Take 2 tablets (1,000 mg total) by mouth every 6 (six) hours as needed. 04/21/22  Yes Carlisle Beers, FNP  amoxicillin-clavulanate (AUGMENTIN) 875-125 MG tablet Take 1 tablet by mouth 2 (two) times daily. 12/13/23   Tysinger, Kermit Balo, PA-C  benzonatate (TESSALON) 200 MG capsule Take 1 capsule (200 mg total) by mouth 3 (three) times daily as needed for cough. 12/13/23   Tysinger, Kermit Balo, PA-C  predniSONE (DELTASONE) 20 MG tablet 3 tablets today, 2 tablets tomorrow, 1 tablet the third day 12/13/23   Tysinger, Kermit Balo, PA-C  valsartan (DIOVAN) 40 MG tablet Take 1 tablet (40 mg total) by mouth daily. 12/13/23 12/12/24  Tysinger,  Kermit Balo, PA-C     Family History  Problem Relation Age of Onset   Cancer Mother        lung; former smoker   COPD Father        emphysema   Diabetes Maternal Aunt    Heart disease Maternal Uncle    Heart disease Maternal Uncle    Diabetes Maternal Aunt    Stroke Neg Hx    Hyperlipidemia Neg Hx    Hypertension Neg Hx     Social History   Socioeconomic History   Marital status: Married    Spouse name: Not on file   Number of children: Not on file   Years of education: Not on file   Highest education level: Not on file  Occupational History   Not on file  Tobacco Use   Smoking status: Never   Smokeless tobacco: Never  Vaping Use   Vaping status: Never Used  Substance and Sexual Activity   Alcohol use: Yes    Alcohol/week: 3.0 standard drinks of alcohol    Types: 3 Shots of liquor per week   Drug use: Not Currently   Sexual activity: Not on file  Other Topics Concern   Not on file  Social History Narrative   Engaged, plan to marry in March 2021, been together since 2014.  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    [employer].   Most exercise on the job,  working 7 days per week.   No children, but fiance has 5 children.    11/2019   Social Drivers of Corporate investment banker Strain: Not on file  Food Insecurity: Not on file  Transportation Needs: Not on file  Physical Activity: Not on file  Stress: Not on file  Social Connections: Not on file     Review of Systems: A 12 point ROS discussed and pertinent positives are indicated in the HPI above.  All other systems are negative.  Review of Systems  Constitutional:  Negative for fatigue and fever.  HENT:  Negative for congestion and dental problem.   Respiratory:  Negative for cough, chest tightness, shortness of breath and wheezing.   Cardiovascular:  Negative for chest pain.  Gastrointestinal:  Negative for diarrhea, nausea and vomiting.  Neurological:  Negative for light-headedness and headaches.   Psychiatric/Behavioral:  Negative for agitation, behavioral problems and confusion.     Vital Signs: BP 101/63 (BP Location: Right Arm)   Pulse 77   Temp 98.1 F (36.7 C) (Oral)   Resp 17   Ht 6' (1.829 m)   Wt 235 lb (106.6 kg)   SpO2 95%   BMI 31.87 kg/m   Advance Care Plan: The advanced care place/surrogate decision maker was discussed at the time of visit and the patient did not wish to discuss or was not able to name a surrogate decision maker or provide an advance care plan.  Physical Exam Constitutional:      Appearance: Normal appearance.  HENT:     Head: Normocephalic and atraumatic.     Mouth/Throat:     Mouth: Mucous membranes are moist.  Cardiovascular:     Rate and Rhythm: Normal rate and regular rhythm.  Pulmonary:     Effort: Pulmonary effort is normal.     Breath sounds: Normal breath sounds.  Abdominal:     General: Bowel sounds are normal.     Palpations: Abdomen is soft.  Musculoskeletal:        General: Normal range of motion.     Cervical back: Normal range of motion.  Skin:    General: Skin is warm and dry.  Neurological:     Mental Status: He is alert and oriented to person, place, and time.  Psychiatric:        Mood and Affect: Mood normal.        Behavior: Behavior normal.     Imaging: CT ABDOMEN PELVIS W CONTRAST Result Date: 12/18/2023 CLINICAL DATA:  Abdominal pain, acute, nonlocalized. Shortness of breath. EXAM: CT ABDOMEN AND PELVIS WITH CONTRAST TECHNIQUE: Multidetector CT imaging of the abdomen and pelvis was performed using the standard protocol following bolus administration of intravenous contrast. RADIATION DOSE REDUCTION: This exam was performed according to the departmental dose-optimization program which includes automated exposure control, adjustment of the mA and/or kV according to patient size and/or use of iterative reconstruction technique. CONTRAST:  OMNIPAQUE IOHEXOL 350 MG/ML SOLN COMPARISON:  12/05/2023 FINDINGS:  Lower chest: No acute abnormality Hepatobiliary: Numerous hepatic lesions are again noted similar prior study compatible with metastases. Gallbladder unremarkable. Pancreas: No focal abnormality or ductal dilatation. Spleen: No focal abnormality.  Normal size. Adrenals/Urinary Tract: No adrenal abnormality. No focal renal abnormality. No stones or hydronephrosis. Urinary bladder is unremarkable. Stomach/Bowel: Stomach, large and small bowel grossly unremarkable. Normal appendix. Vascular/Lymphatic: Aortic atherosclerosis. No aneurysm. Multiple enlarged retroperitoneal, inguinal and central mesenteric lymph nodes, stable since prior study. Reproductive: No visible focal abnormality. Other:  No free fluid or free air. Small left inguinal hernia containing fat. Musculoskeletal: No acute bony abnormality. IMPRESSION: Continued numerous large masses throughout the liver compatible with metastases, unchanged since recent study. Unchanged multi station lymphadenopathy. Aortic atherosclerosis. Electronically Signed   By: Charlett Nose M.D.   On: 12/18/2023 00:57   CT Angio Chest PE W/Cm &/Or Wo Cm Result Date: 12/18/2023 CLINICAL DATA:  Pulmonary embolism (PE) suspected, low to intermediate prob, positive D-dimer. Shortness of breath EXAM: CT ANGIOGRAPHY CHEST WITH CONTRAST TECHNIQUE: Multidetector CT imaging of the chest was performed using the standard protocol during bolus administration of intravenous contrast. Multiplanar CT image reconstructions and MIPs were obtained to evaluate the vascular anatomy. RADIATION DOSE REDUCTION: This exam was performed according to the departmental dose-optimization program which includes automated exposure control, adjustment of the mA and/or kV according to patient size and/or use of iterative reconstruction technique. CONTRAST:  OMNIPAQUE IOHEXOL 350 MG/ML SOLN COMPARISON:  12/05/2023 FINDINGS: Cardiovascular: No filling defects in the pulmonary arteries to suggest pulmonary  emboli. Heart mildly enlarged. Scattered coronary artery and aortic atherosclerosis. Mediastinum/Nodes: No mediastinal, hilar, or axillary adenopathy. Trachea and esophagus are unremarkable. Thyroid unremarkable. Lungs/Pleura: Lungs are clear. No focal airspace opacities or suspicious nodules. No effusions. Upper Abdomen: See abdominal CT report Musculoskeletal: Chest wall soft tissues are unremarkable. No acute bony abnormality. Review of the MIP images confirms the above findings. IMPRESSION: No evidence of pulmonary embolus. No acute cardiopulmonary disease. Coronary artery disease. Aortic Atherosclerosis (ICD10-I70.0). Electronically Signed   By: Charlett Nose M.D.   On: 12/18/2023 00:54   DG Chest Port 1 View Result Date: 12/17/2023 CLINICAL DATA:  Worsening shortness of breath. EXAM: PORTABLE CHEST 1 VIEW COMPARISON:  April 21, 2022 FINDINGS: A right-sided venous Port-A-Cath is in place, with its distal tip seen at the junction of the superior vena cava and right atrium. The heart size and mediastinal contours are within normal limits. Both lungs are clear. The visualized skeletal structures are unremarkable. IMPRESSION: No active disease. Electronically Signed   By: Aram Candela M.D.   On: 12/17/2023 22:22   CT CHEST ABDOMEN PELVIS W CONTRAST Result Date: 12/05/2023 CLINICAL DATA:  Hematologic malignancy, assess treatment response EXAM: CT CHEST, ABDOMEN, AND PELVIS WITH CONTRAST TECHNIQUE: Multidetector CT imaging of the chest, abdomen and pelvis was performed following the standard protocol during bolus administration of intravenous contrast. RADIATION DOSE REDUCTION: This exam was performed according to the departmental dose-optimization program which includes automated exposure control, adjustment of the mA and/or kV according to patient size and/or use of iterative reconstruction technique. CONTRAST:  OMNIPAQUE IOHEXOL 300 MG/ML  SOLN COMPARISON:  CT chest abdomen and pelvis 06/10/2023.  FINDINGS: CT CHEST FINDINGS Cardiovascular: Right chest wall infusion port with the distal tip at the superior cavoatrial junction. No acute vascular abnormality. Normal heart size. No pericardial effusion. Mediastinum/Nodes: No enlarged mediastinal, hilar, or axillary lymph nodes. Thyroid gland, trachea, and esophagus demonstrate no significant findings. Lungs/Pleura: Lungs are clear. No pleural effusion or pneumothorax. Musculoskeletal: No chest wall mass or suspicious bone lesions identified. CT ABDOMEN PELVIS FINDINGS Hepatobiliary: Numerous new hypoattenuating hepatic lesions, the largest measuring up to 10 cm in the left lobe (2:58). A 7 mm filling defect in the proximal right portal vein (2:62) may represent thrombus or tumor invasion. Normal gallbladder. No biliary ductal dilation. Pancreas: Unremarkable. No pancreatic ductal dilatation or surrounding inflammatory changes. Spleen: Normal in size without focal abnormality. Adrenals/Urinary Tract: Adrenal glands are unremarkable. Kidneys are normal, without renal  calculi, focal lesion, or hydronephrosis. Bladder is unremarkable. Stomach/Bowel: Stomach is within normal limits. Appendix appears normal. No evidence of bowel wall thickening, distention, or inflammatory changes. Vascular/Lymphatic: Aortic atherosclerosis. Multiple enlarged lymph nodes in the retroperitoneal, periaortic, and inguinal regions. A left periaortic node measuring 3.2 x 2.3 cm (2:76) as well as a dominant 3 cm left inguinal node (2:130), appear similar from prior. Reproductive: Prostatomegaly. Other: No abdominal wall hernia or abnormality. No abdominopelvic ascites. Musculoskeletal: No acute or significant osseous findings. IMPRESSION: 1. Multiple new hepatic lesions highly suggestive for metastatic disease. 2. Subcentimeter filling defect in the proximal right portal vein may represent artifact, nonocclusive thrombus, or tumor invasion. 3. Overall stable multi station lymphadenopathy as  described. A communications note was sent to the ordering provider's office on 12/05/2023 at 15:00 Electronically Signed   By: Levi Aland M.D.   On: 12/05/2023 15:09    Labs:  CBC: Recent Labs    12/08/23 1144 12/14/23 1014 12/17/23 2155 12/20/23 0639  WBC 6.9 11.7* 11.8* 11.8*  HGB 11.0* 10.1* 10.1* 10.1*  HCT 34.2* 31.0* 32.1* 31.7*  PLT 385 403* 442* 380    COAGS: Recent Labs    12/20/23 0639  INR 1.3*    BMP: Recent Labs    06/14/23 0933 09/14/23 1022 12/08/23 1144 12/14/23 1014 12/17/23 2155  NA 141 138 141 135 134*  K 4.3 4.0 4.7 4.3 4.5  CL 106 105 99 100 97*  CO2 28 26 21 28  33*  GLUCOSE 100* 91 90 212* 124*  BUN 16 18 14 22 16   CALCIUM 9.3 9.7 9.3 9.3 9.3  CREATININE 1.01 0.89 1.03 1.23 1.23  GFRNONAA >60 >60  --  >60 >60    LIVER FUNCTION TESTS: Recent Labs    09/14/23 1022 12/08/23 1144 12/14/23 1014 12/17/23 2155  BILITOT 0.7 0.6 0.9 1.8*  AST 15 62* 60* 110*  ALT 14 43 44 75*  ALKPHOS 86 380* 357* 371*  PROT 7.0 6.7 6.9 6.9  ALBUMIN 4.4 4.0 3.9 3.7    TUMOR MARKERS: No results for input(s): "AFPTM", "CEA", "CA199", "CHROMGRNA" in the last 8760 hours.  Assessment and Plan:  Pt has a history of lymphoma and liver lesion; scheduled for image guided liver lesion biopsy 12/20/2023.    Risks and benefits of image guided liver lesion biopsy was discussed with the patient and/or patient's family including, but not limited to bleeding, infection, damage to adjacent structures or low yield requiring additional tests.  All of the questions were answered and there is agreement to proceed.  Consent signed and in chart.  Thank you for allowing our service to participate in [facility] 's care.  Electronically Signed: Loman Brooklyn, PA-C   12/20/2023, 7:40 AM    I spent a total of  30 Minutes   in face to face in clinical consultation, greater than 50% of which was counseling/coordinating care for image guided liver lesion  biopsy.

## 2023-12-22 ENCOUNTER — Telehealth: Payer: Self-pay | Admitting: Hematology and Oncology

## 2023-12-22 ENCOUNTER — Encounter: Payer: Self-pay | Admitting: Gastroenterology

## 2023-12-22 DIAGNOSIS — D509 Iron deficiency anemia, unspecified: Secondary | ICD-10-CM | POA: Diagnosis not present

## 2023-12-22 DIAGNOSIS — K2100 Gastro-esophageal reflux disease with esophagitis, without bleeding: Secondary | ICD-10-CM | POA: Diagnosis not present

## 2023-12-22 DIAGNOSIS — K921 Melena: Secondary | ICD-10-CM | POA: Diagnosis not present

## 2023-12-22 LAB — SURGICAL PATHOLOGY

## 2023-12-22 NOTE — Telephone Encounter (Signed)
Called Jorge Gould to discuss results of his pathology report.  Findings unfortunately concerning for metastatic adenocarcinoma, most likely colon origin.  Patient underwent EGD today with Dr. Elnoria Howard.  Reach out to GI to see if colonoscopy can be performed.  Additionally we will order a PET CT scan in order to assess for other sites of disease.  The patient voiced understanding of our findings and plan moving forward.   Jorge Barns, MD Department of Hematology/Oncology [facility] Phone: [phone] Pager: [phone] Email: [email or web address]

## 2023-12-23 ENCOUNTER — Telehealth: Payer: Self-pay | Admitting: *Deleted

## 2023-12-23 ENCOUNTER — Other Ambulatory Visit: Payer: Self-pay | Admitting: Hematology and Oncology

## 2023-12-23 DIAGNOSIS — C189 Malignant neoplasm of colon, unspecified: Secondary | ICD-10-CM

## 2023-12-23 NOTE — Telephone Encounter (Signed)
TCT patient this morning. Spoke with him. Advised that Dr. Leonides Schanz has spoken to Dr. Elnoria Howard. Advised that Dr. Elnoria Howard does not believe that Jorge Gould needs a colonoscopy as he had one in October 2024 that was unremarkable. Advised that the next step is to a PET Scan. Informed him that it is scheduled for Wednesday, 12/28/23 @ 3:45 pm with arrival time of 3:30 pm. Told Jorge Gould that he needs to be NPO for 6 hours prior to scan, except for water. Advised that we will schedule him back with Dr. Leonides Schanz soon after the scan to decide on treatment plan. Jorge Gould voiced understanding.

## 2023-12-27 ENCOUNTER — Telehealth: Payer: Self-pay | Admitting: *Deleted

## 2023-12-27 NOTE — Telephone Encounter (Signed)
 Contacted patient to f/u on message left w/Dr. Derek Mound desk staff late yesterday to inquire how he was feeling this morning. Patient had contacted office late yesterday with concerns about being weak and staying in bed most of time. Per Dr. Leonides Schanz, recommend patient come to [facility] for evaluation today. He states he is up and out of bed today and [place]. Drinking 10-15 glasses of liquid a day, including ensure. Appetite poor but no nausea. Will continue drinking fluids and try eating today.  Declined offer of [facility] for evaluation today.  Will contact office if he does not feel better or if he feels worse.

## 2023-12-28 ENCOUNTER — Encounter (HOSPITAL_COMMUNITY)
Admission: RE | Admit: 2023-12-28 | Discharge: 2023-12-28 | Disposition: A | Discharge status: Home / Self Care | Payer: Medicare Other | Source: Ambulatory Visit | Attending: Hematology and Oncology | Admitting: Hematology and Oncology

## 2023-12-28 ENCOUNTER — Encounter (HOSPITAL_COMMUNITY): Admission: RE | Admit: 2023-12-28 | Payer: Medicare Other | Source: Ambulatory Visit

## 2023-12-28 DIAGNOSIS — R932 Abnormal findings on diagnostic imaging of liver and biliary tract: Secondary | ICD-10-CM | POA: Diagnosis not present

## 2023-12-28 DIAGNOSIS — C189 Malignant neoplasm of colon, unspecified: Secondary | ICD-10-CM | POA: Insufficient documentation

## 2023-12-28 DIAGNOSIS — C787 Secondary malignant neoplasm of liver and intrahepatic bile duct: Secondary | ICD-10-CM | POA: Diagnosis not present

## 2023-12-28 DIAGNOSIS — C188 Malignant neoplasm of overlapping sites of colon: Secondary | ICD-10-CM | POA: Diagnosis not present

## 2023-12-28 LAB — GLUCOSE, CAPILLARY: Glucose-Capillary: 90 mg/dL (ref 70–99)

## 2023-12-28 MED ORDER — HEPARIN SOD (PORK) LOCK FLUSH 10 UNIT/ML IV SOLN
10.0000 [IU] | Freq: Once | INTRAVENOUS | Status: AC
  Administered 2023-12-28: 10 [IU] via INTRAVENOUS
  Filled 2023-12-28: qty 1

## 2023-12-28 MED ORDER — FLUDEOXYGLUCOSE F - 18 (FDG) INJECTION
11.9300 | Freq: Once | INTRAVENOUS | Status: AC
  Administered 2023-12-28: 11.93 via INTRAVENOUS

## 2023-12-29 ENCOUNTER — Other Ambulatory Visit: Payer: Self-pay | Admitting: *Deleted

## 2023-12-29 DIAGNOSIS — C189 Malignant neoplasm of colon, unspecified: Secondary | ICD-10-CM

## 2023-12-30 ENCOUNTER — Other Ambulatory Visit: Payer: Self-pay | Admitting: *Deleted

## 2023-12-30 ENCOUNTER — Inpatient Hospital Stay: Payer: Medicare Other

## 2023-12-30 ENCOUNTER — Inpatient Hospital Stay: Payer: Medicare Other | Admitting: Hematology and Oncology

## 2023-12-30 VITALS — BP 139/93 | HR 98 | Temp 97.5°F | Resp 17 | Wt 238.5 lb

## 2023-12-30 DIAGNOSIS — C189 Malignant neoplasm of colon, unspecified: Secondary | ICD-10-CM

## 2023-12-30 DIAGNOSIS — Z825 Family history of asthma and other chronic lower respiratory diseases: Secondary | ICD-10-CM | POA: Diagnosis not present

## 2023-12-30 DIAGNOSIS — Z23 Encounter for immunization: Secondary | ICD-10-CM | POA: Diagnosis not present

## 2023-12-30 DIAGNOSIS — D849 Immunodeficiency, unspecified: Secondary | ICD-10-CM | POA: Diagnosis not present

## 2023-12-30 DIAGNOSIS — Z6835 Body mass index (BMI) 35.0-35.9, adult: Secondary | ICD-10-CM | POA: Diagnosis not present

## 2023-12-30 DIAGNOSIS — E785 Hyperlipidemia, unspecified: Secondary | ICD-10-CM | POA: Diagnosis not present

## 2023-12-30 DIAGNOSIS — Z1152 Encounter for screening for COVID-19: Secondary | ICD-10-CM | POA: Diagnosis not present

## 2023-12-30 DIAGNOSIS — R6520 Severe sepsis without septic shock: Secondary | ICD-10-CM | POA: Diagnosis not present

## 2023-12-30 DIAGNOSIS — C787 Secondary malignant neoplasm of liver and intrahepatic bile duct: Secondary | ICD-10-CM | POA: Diagnosis not present

## 2023-12-30 DIAGNOSIS — J09X1 Influenza due to identified novel influenza A virus with pneumonia: Secondary | ICD-10-CM | POA: Diagnosis not present

## 2023-12-30 DIAGNOSIS — R509 Fever, unspecified: Secondary | ICD-10-CM | POA: Diagnosis not present

## 2023-12-30 DIAGNOSIS — J101 Influenza due to other identified influenza virus with other respiratory manifestations: Secondary | ICD-10-CM | POA: Diagnosis present

## 2023-12-30 DIAGNOSIS — R5084 Febrile nonhemolytic transfusion reaction: Secondary | ICD-10-CM | POA: Diagnosis not present

## 2023-12-30 DIAGNOSIS — D630 Anemia in neoplastic disease: Secondary | ICD-10-CM | POA: Diagnosis not present

## 2023-12-30 DIAGNOSIS — R000 Tachycardia, unspecified: Secondary | ICD-10-CM | POA: Diagnosis not present

## 2023-12-30 DIAGNOSIS — G9341 Metabolic encephalopathy: Secondary | ICD-10-CM | POA: Diagnosis not present

## 2023-12-30 DIAGNOSIS — Z833 Family history of diabetes mellitus: Secondary | ICD-10-CM | POA: Diagnosis not present

## 2023-12-30 DIAGNOSIS — A4189 Other specified sepsis: Secondary | ICD-10-CM | POA: Diagnosis not present

## 2023-12-30 DIAGNOSIS — N179 Acute kidney failure, unspecified: Secondary | ICD-10-CM | POA: Diagnosis not present

## 2023-12-30 DIAGNOSIS — Z8249 Family history of ischemic heart disease and other diseases of the circulatory system: Secondary | ICD-10-CM | POA: Diagnosis not present

## 2023-12-30 DIAGNOSIS — A419 Sepsis, unspecified organism: Secondary | ICD-10-CM | POA: Diagnosis not present

## 2023-12-30 DIAGNOSIS — A021 Salmonella sepsis: Secondary | ICD-10-CM | POA: Diagnosis not present

## 2023-12-30 DIAGNOSIS — C8233 Follicular lymphoma grade IIIa, intra-abdominal lymph nodes: Secondary | ICD-10-CM | POA: Diagnosis not present

## 2023-12-30 DIAGNOSIS — Z9221 Personal history of antineoplastic chemotherapy: Secondary | ICD-10-CM | POA: Diagnosis not present

## 2023-12-30 DIAGNOSIS — J1000 Influenza due to other identified influenza virus with unspecified type of pneumonia: Secondary | ICD-10-CM | POA: Diagnosis not present

## 2023-12-30 DIAGNOSIS — I10 Essential (primary) hypertension: Secondary | ICD-10-CM | POA: Diagnosis not present

## 2023-12-30 DIAGNOSIS — Z95828 Presence of other vascular implants and grafts: Secondary | ICD-10-CM

## 2023-12-30 LAB — CBC WITH DIFFERENTIAL (CANCER CENTER ONLY)
Abs Immature Granulocytes: 0.13 10*3/uL — ABNORMAL HIGH (ref 0.00–0.07)
Basophils Absolute: 0 10*3/uL (ref 0.0–0.1)
Basophils Relative: 0 %
Eosinophils Absolute: 0 10*3/uL (ref 0.0–0.5)
Eosinophils Relative: 0 %
HCT: 26.9 % — ABNORMAL LOW (ref 39.0–52.0)
Hemoglobin: 8.5 g/dL — ABNORMAL LOW (ref 13.0–17.0)
Immature Granulocytes: 1 %
Lymphocytes Relative: 6 %
Lymphs Abs: 0.7 10*3/uL (ref 0.7–4.0)
MCH: 25.1 pg — ABNORMAL LOW (ref 26.0–34.0)
MCHC: 31.6 g/dL (ref 30.0–36.0)
MCV: 79.4 fL — ABNORMAL LOW (ref 80.0–100.0)
Monocytes Absolute: 0.9 10*3/uL (ref 0.1–1.0)
Monocytes Relative: 8 %
Neutro Abs: 9.9 10*3/uL — ABNORMAL HIGH (ref 1.7–7.7)
Neutrophils Relative %: 85 %
Platelet Count: 507 10*3/uL — ABNORMAL HIGH (ref 150–400)
RBC: 3.39 MIL/uL — ABNORMAL LOW (ref 4.22–5.81)
RDW: 16.1 % — ABNORMAL HIGH (ref 11.5–15.5)
WBC Count: 11.7 10*3/uL — ABNORMAL HIGH (ref 4.0–10.5)
nRBC: 0 % (ref 0.0–0.2)

## 2023-12-30 LAB — CMP (CANCER CENTER ONLY)
ALT: 63 U/L — ABNORMAL HIGH (ref 0–44)
AST: 120 U/L — ABNORMAL HIGH (ref 15–41)
Albumin: 3.3 g/dL — ABNORMAL LOW (ref 3.5–5.0)
Alkaline Phosphatase: 639 U/L — ABNORMAL HIGH (ref 38–126)
Anion gap: 8 (ref 5–15)
BUN: 22 mg/dL (ref 8–23)
CO2: 26 mmol/L (ref 22–32)
Calcium: 8.9 mg/dL (ref 8.9–10.3)
Chloride: 98 mmol/L (ref 98–111)
Creatinine: 1.04 mg/dL (ref 0.61–1.24)
GFR, Estimated: >60 mL/min (ref >60)
Glucose, Bld: 137 mg/dL — ABNORMAL HIGH (ref 70–99)
Potassium: 4.2 mmol/L (ref 3.5–5.1)
Sodium: 132 mmol/L — ABNORMAL LOW (ref 135–145)
Total Bilirubin: 1.2 mg/dL (ref 0.0–1.2)
Total Protein: 6.2 g/dL — ABNORMAL LOW (ref 6.5–8.1)

## 2023-12-30 LAB — SAMPLE TO BLOOD BANK

## 2023-12-30 LAB — ABO/RH: ABO/RH(D): O POS

## 2023-12-30 LAB — CEA (ACCESS): CEA (CHCC): 727.34 ng/mL — ABNORMAL HIGH (ref 0.00–5.00)

## 2023-12-30 LAB — PREPARE RBC (CROSSMATCH)

## 2023-12-30 MED ORDER — HEPARIN SOD (PORK) LOCK FLUSH 100 UNIT/ML IV SOLN
500.0000 [IU] | Freq: Once | INTRAVENOUS | Status: AC
  Administered 2023-12-30: 500 [IU]

## 2023-12-30 MED ORDER — OXYCODONE HCL 5 MG PO TABS: 5.0000 mg | ORAL_TABLET | ORAL | 0 refills | Status: DC | PRN

## 2023-12-30 MED ORDER — SODIUM CHLORIDE 0.9% FLUSH
10.0000 mL | Freq: Once | INTRAVENOUS | Status: AC
  Administered 2023-12-30: 10 mL

## 2023-12-30 NOTE — Progress Notes (Signed)
OFF PATHWAY REGIMEN - Lymphoma and CLL  No Change  Continue With Treatment as Ordered.  Original Decision Date/Time: 06/13/2022 18:50   [id number]:R-CHOP + G-CSF q21 Days:   A cycle is every 21 days:     Prednisone      Rituximab-xxxx      Cyclophosphamide      Doxorubicin      Vincristine      Pegfilgrastim-xxxx   **Always confirm dose/schedule in your pharmacy ordering system**  Patient Characteristics: Follicular Lymphoma, Grades 1, 2, and 3A, Second Line, Prior Treatment with Bendamustine + Rituximab, Relapse ? 24 Months, Treating as Specialist or Consulting with Specialist Disease Type: Follicular Lymphoma, Grade 1, 2, or 3A Disease Type: Not Applicable Disease Type: Not Applicable Line of Therapy: Second Line Prior Treatment: Prior Treatment with Bendamustine + Rituximab Time to Relapse: Relapse ? 24 Months Please indicate whether you are: A specialist Intent of Therapy: Curative Intent, Discussed with Patient

## 2023-12-30 NOTE — Progress Notes (Signed)
START ON PATHWAY REGIMEN - Colorectal     A cycle is every 14 days:     Bevacizumab-xxxx      Oxaliplatin      Leucovorin      Fluorouracil      Fluorouracil   **Always confirm dose/schedule in your pharmacy ordering system**  Patient Characteristics: Distant Metastases, Nonsurgical Candidate, Non-KRAS G12C, RAS Mutation Positive/Unknown (BRAF V600 Wild-Type/Unknown), Standard Cytotoxic Therapy, First Line Standard Cytotoxic Therapy, Bevacizumab Eligible, PS = 0,1 Tumor Location: Colon Therapeutic Status: Distant Metastases Microsatellite/Mismatch Repair Status: Unknown BRAF Mutation Status: Awaiting Test Results KRAS/NRAS Mutation Status: Awaiting Test Results Preferred Therapy Approach: Standard Cytotoxic Therapy Standard Cytotoxic Line of Therapy: First Line Standard Cytotoxic Therapy ECOG Performance Status: 1 Bevacizumab Eligibility: Eligible Intent of Therapy: Non-Curative / Palliative Intent, Discussed with Patient

## 2023-12-31 ENCOUNTER — Other Ambulatory Visit: Payer: Self-pay

## 2023-12-31 ENCOUNTER — Inpatient Hospital Stay: Payer: Medicare Other

## 2023-12-31 DIAGNOSIS — C189 Malignant neoplasm of colon, unspecified: Secondary | ICD-10-CM

## 2023-12-31 LAB — URINALYSIS, COMPLETE (UACMP) WITH MICROSCOPIC
Bacteria, UA: NONE SEEN
Bilirubin Urine: NEGATIVE
Glucose, UA: NEGATIVE mg/dL
Hgb urine dipstick: NEGATIVE
Ketones, ur: NEGATIVE mg/dL
Leukocytes,Ua: NEGATIVE
Nitrite: NEGATIVE
Protein, ur: 100 mg/dL — AB
Specific Gravity, Urine: 1.018 (ref 1.005–1.030)
pH: 5 (ref 5.0–8.0)

## 2023-12-31 MED ORDER — METHYLPREDNISOLONE SODIUM SUCC 125 MG IJ SOLR
62.5000 mg | Freq: Once | INTRAMUSCULAR | Status: AC
  Administered 2023-12-31: 62.5 mg via INTRAVENOUS

## 2023-12-31 MED ORDER — ACETAMINOPHEN 325 MG PO TABS
650.0000 mg | ORAL_TABLET | Freq: Once | ORAL | Status: AC
  Administered 2023-12-31: 650 mg via ORAL
  Filled 2023-12-31: qty 2

## 2023-12-31 MED ORDER — SODIUM CHLORIDE 0.9 % IV SOLN: INTRAVENOUS | Status: DC

## 2023-12-31 MED ORDER — HEPARIN SOD (PORK) LOCK FLUSH 100 UNIT/ML IV SOLN
500.0000 [IU] | Freq: Every day | INTRAVENOUS | Status: AC | PRN
  Administered 2023-12-31: 500 [IU]

## 2023-12-31 MED ORDER — FAMOTIDINE IN NACL 20-0.9 MG/50ML-% IV SOLN
20.0000 mg | Freq: Once | INTRAVENOUS | Status: AC
  Administered 2023-12-31: 20 mg via INTRAVENOUS

## 2023-12-31 MED ORDER — SODIUM CHLORIDE 0.9% IV SOLUTION
250.0000 mL | INTRAVENOUS | Status: DC
  Administered 2023-12-31: 100 mL via INTRAVENOUS

## 2023-12-31 MED ORDER — SODIUM CHLORIDE 0.9% FLUSH
10.0000 mL | INTRAVENOUS | Status: AC | PRN
Start: 2023-12-31 — End: 2023-12-31
  Administered 2023-12-31: 10 mL

## 2023-12-31 MED ORDER — DIPHENHYDRAMINE HCL 50 MG/ML IJ SOLN
50.0000 mg | Freq: Once | INTRAMUSCULAR | Status: AC
  Administered 2023-12-31: 25 mg via INTRAVENOUS

## 2023-12-31 NOTE — Progress Notes (Signed)
 At 1200, called Dr. Cherly Hensen on call - patient at 15 min transfusion check had an increased temp of 99.9 from 97.7. He reports not feeling anything else different. No other VS changes. We had already given Tylenol so Solu-Medrol was ordered 62.5mg  IVP and after pausing transfusion for 15 minutes, we may resume blood transfusion if patient still feels the same. Do not have to call MD back prior to resuming if there are not any other changes in patient status or VS.   At 1215, called Dr. Cherly Hensen back again to report temperature still increasing. He ordered Pepcid 20mg  IV and Benadryl 25mg  IV and to completely stop transfusion and will now proceed with suspected febrile transfusion reaction/ will call blood bank to notify them.  At 1220, called blood bank and spoke with [facility], who advised what specimens of blood and urine to draw. That was done and bag of blood returned to BB, awaiting urine specimen at this time.   At 1300, Dr. Cherly Hensen was notified that the patient was still feeling fine and his temperature was decreasing back to the 99's. Wife was made aware - she had not answered phone when we tried to call her previously- and she is on the way to pick  him up. Dr. Cherly Hensen said if no other symptoms were present, patient can be discharged home with spouse and await a call from Dr. Leonides Schanz on Monday.

## 2023-12-31 NOTE — Patient Instructions (Signed)

## 2024-01-01 ENCOUNTER — Other Ambulatory Visit: Payer: Self-pay

## 2024-01-02 ENCOUNTER — Inpatient Hospital Stay (HOSPITAL_COMMUNITY)
Admission: EM | Admit: 2024-01-02 | Discharge: 2024-01-05 | DRG: 871 | Disposition: A | Discharge status: Home / Self Care | Payer: Medicare Other | Attending: Internal Medicine | Admitting: Internal Medicine

## 2024-01-02 ENCOUNTER — Emergency Department (HOSPITAL_COMMUNITY): Payer: Medicare Other

## 2024-01-02 ENCOUNTER — Other Ambulatory Visit: Payer: Self-pay

## 2024-01-02 ENCOUNTER — Encounter (HOSPITAL_COMMUNITY): Payer: Self-pay

## 2024-01-02 DIAGNOSIS — Z825 Family history of asthma and other chronic lower respiratory diseases: Secondary | ICD-10-CM | POA: Diagnosis not present

## 2024-01-02 DIAGNOSIS — D849 Immunodeficiency, unspecified: Secondary | ICD-10-CM | POA: Diagnosis present

## 2024-01-02 DIAGNOSIS — Z833 Family history of diabetes mellitus: Secondary | ICD-10-CM

## 2024-01-02 DIAGNOSIS — Z23 Encounter for immunization: Secondary | ICD-10-CM

## 2024-01-02 DIAGNOSIS — A419 Sepsis, unspecified organism: Secondary | ICD-10-CM | POA: Diagnosis not present

## 2024-01-02 DIAGNOSIS — R5084 Febrile nonhemolytic transfusion reaction: Secondary | ICD-10-CM | POA: Diagnosis not present

## 2024-01-02 DIAGNOSIS — J1000 Influenza due to other identified influenza virus with unspecified type of pneumonia: Secondary | ICD-10-CM | POA: Diagnosis present

## 2024-01-02 DIAGNOSIS — J09X1 Influenza due to identified novel influenza A virus with pneumonia: Secondary | ICD-10-CM | POA: Diagnosis not present

## 2024-01-02 DIAGNOSIS — Z8249 Family history of ischemic heart disease and other diseases of the circulatory system: Secondary | ICD-10-CM | POA: Diagnosis not present

## 2024-01-02 DIAGNOSIS — Z1152 Encounter for screening for COVID-19: Secondary | ICD-10-CM | POA: Diagnosis not present

## 2024-01-02 DIAGNOSIS — E785 Hyperlipidemia, unspecified: Secondary | ICD-10-CM | POA: Diagnosis present

## 2024-01-02 DIAGNOSIS — Z6835 Body mass index (BMI) 35.0-35.9, adult: Secondary | ICD-10-CM

## 2024-01-02 DIAGNOSIS — C8293 Follicular lymphoma, unspecified, intra-abdominal lymph nodes: Secondary | ICD-10-CM | POA: Diagnosis present

## 2024-01-02 DIAGNOSIS — Z9221 Personal history of antineoplastic chemotherapy: Secondary | ICD-10-CM

## 2024-01-02 DIAGNOSIS — D630 Anemia in neoplastic disease: Secondary | ICD-10-CM | POA: Diagnosis present

## 2024-01-02 DIAGNOSIS — A4189 Other specified sepsis: Principal | ICD-10-CM | POA: Diagnosis present

## 2024-01-02 DIAGNOSIS — C189 Malignant neoplasm of colon, unspecified: Secondary | ICD-10-CM | POA: Diagnosis present

## 2024-01-02 DIAGNOSIS — C8233 Follicular lymphoma grade IIIa, intra-abdominal lymph nodes: Secondary | ICD-10-CM | POA: Diagnosis present

## 2024-01-02 DIAGNOSIS — I10 Essential (primary) hypertension: Secondary | ICD-10-CM | POA: Diagnosis present

## 2024-01-02 DIAGNOSIS — J101 Influenza due to other identified influenza virus with other respiratory manifestations: Secondary | ICD-10-CM | POA: Diagnosis present

## 2024-01-02 DIAGNOSIS — R509 Fever, unspecified: Principal | ICD-10-CM

## 2024-01-02 DIAGNOSIS — G9341 Metabolic encephalopathy: Secondary | ICD-10-CM | POA: Diagnosis not present

## 2024-01-02 DIAGNOSIS — R6520 Severe sepsis without septic shock: Secondary | ICD-10-CM | POA: Diagnosis not present

## 2024-01-02 DIAGNOSIS — A021 Salmonella sepsis: Secondary | ICD-10-CM | POA: Diagnosis not present

## 2024-01-02 DIAGNOSIS — C787 Secondary malignant neoplasm of liver and intrahepatic bile duct: Secondary | ICD-10-CM | POA: Diagnosis present

## 2024-01-02 DIAGNOSIS — N179 Acute kidney failure, unspecified: Secondary | ICD-10-CM | POA: Diagnosis present

## 2024-01-02 LAB — COMPREHENSIVE METABOLIC PANEL
ALT: 88 U/L — ABNORMAL HIGH (ref 0–44)
AST: 139 U/L — ABNORMAL HIGH (ref 15–41)
Albumin: 2.9 g/dL — ABNORMAL LOW (ref 3.5–5.0)
Alkaline Phosphatase: 608 U/L — ABNORMAL HIGH (ref 38–126)
Anion gap: 14 (ref 5–15)
BUN: 49 mg/dL — ABNORMAL HIGH (ref 8–23)
CO2: 20 mmol/L — ABNORMAL LOW (ref 22–32)
Calcium: 8.8 mg/dL — ABNORMAL LOW (ref 8.9–10.3)
Chloride: 101 mmol/L (ref 98–111)
Creatinine, Ser: 2.33 mg/dL — ABNORMAL HIGH (ref 0.61–1.24)
GFR, Estimated: 30 mL/min — ABNORMAL LOW (ref >60)
Glucose, Bld: 113 mg/dL — ABNORMAL HIGH (ref 70–99)
Potassium: 5 mmol/L (ref 3.5–5.1)
Sodium: 135 mmol/L (ref 135–145)
Total Bilirubin: 1.1 mg/dL (ref 0.0–1.2)
Total Protein: 6.7 g/dL (ref 6.5–8.1)

## 2024-01-02 LAB — BPAM RBC
Blood Product Expiration Date: 202503192359
ISSUE DATE / TIME: 202502221128
Unit Type and Rh: 5100

## 2024-01-02 LAB — URINALYSIS, W/ REFLEX TO CULTURE (INFECTION SUSPECTED)
Bilirubin Urine: NEGATIVE
Glucose, UA: NEGATIVE mg/dL
Hgb urine dipstick: NEGATIVE
Ketones, ur: NEGATIVE mg/dL
Leukocytes,Ua: NEGATIVE
Nitrite: NEGATIVE
Protein, ur: 30 mg/dL — AB
Specific Gravity, Urine: 1.012 (ref 1.005–1.030)
pH: 5 (ref 5.0–8.0)

## 2024-01-02 LAB — I-STAT CG4 LACTIC ACID, ED: Lactic Acid, Venous: 3.5 mmol/L (ref 0.5–1.9)

## 2024-01-02 LAB — CBC WITH DIFFERENTIAL/PLATELET
Abs Immature Granulocytes: 0.17 10*3/uL — ABNORMAL HIGH (ref 0.00–0.07)
Basophils Absolute: 0 10*3/uL (ref 0.0–0.1)
Basophils Relative: 0 %
Eosinophils Absolute: 0 10*3/uL (ref 0.0–0.5)
Eosinophils Relative: 0 %
HCT: 28 % — ABNORMAL LOW (ref 39.0–52.0)
Hemoglobin: 8.8 g/dL — ABNORMAL LOW (ref 13.0–17.0)
Immature Granulocytes: 1 %
Lymphocytes Relative: 5 %
Lymphs Abs: 0.7 10*3/uL (ref 0.7–4.0)
MCH: 24.7 pg — ABNORMAL LOW (ref 26.0–34.0)
MCHC: 31.4 g/dL (ref 30.0–36.0)
MCV: 78.7 fL — ABNORMAL LOW (ref 80.0–100.0)
Monocytes Absolute: 1.2 10*3/uL — ABNORMAL HIGH (ref 0.1–1.0)
Monocytes Relative: 8 %
Neutro Abs: 12.9 10*3/uL — ABNORMAL HIGH (ref 1.7–7.7)
Neutrophils Relative %: 86 %
Platelets: 584 10*3/uL — ABNORMAL HIGH (ref 150–400)
RBC: 3.56 MIL/uL — ABNORMAL LOW (ref 4.22–5.81)
RDW: 16.6 % — ABNORMAL HIGH (ref 11.5–15.5)
WBC: 15 10*3/uL — ABNORMAL HIGH (ref 4.0–10.5)
nRBC: 0 % (ref 0.0–0.2)

## 2024-01-02 LAB — TROPONIN I (HIGH SENSITIVITY): Troponin I (High Sensitivity): 26 ng/L — ABNORMAL HIGH (ref <18)

## 2024-01-02 LAB — I-STAT CHEM 8, ED
BUN: 47 mg/dL — ABNORMAL HIGH (ref 8–23)
Calcium, Ion: 1.12 mmol/L — ABNORMAL LOW (ref 1.15–1.40)
Chloride: 103 mmol/L (ref 98–111)
Creatinine, Ser: 2.6 mg/dL — ABNORMAL HIGH (ref 0.61–1.24)
Glucose, Bld: 108 mg/dL — ABNORMAL HIGH (ref 70–99)
HCT: 28 % — ABNORMAL LOW (ref 39.0–52.0)
Hemoglobin: 9.5 g/dL — ABNORMAL LOW (ref 13.0–17.0)
Potassium: 5 mmol/L (ref 3.5–5.1)
Sodium: 134 mmol/L — ABNORMAL LOW (ref 135–145)
TCO2: 24 mmol/L (ref 22–32)

## 2024-01-02 LAB — TYPE AND SCREEN
ABO/RH(D): O POS
Antibody Screen: NEGATIVE
Unit division: 0

## 2024-01-02 LAB — APTT: aPTT: 33 s (ref 24–36)

## 2024-01-02 LAB — RESP PANEL BY RT-PCR (RSV, FLU A&B, COVID)  RVPGX2
Influenza A by PCR: POSITIVE — AB
Influenza B by PCR: NEGATIVE
Resp Syncytial Virus by PCR: NEGATIVE
SARS Coronavirus 2 by RT PCR: NEGATIVE

## 2024-01-02 LAB — PROTIME-INR
INR: 1.3 — ABNORMAL HIGH (ref 0.8–1.2)
Prothrombin Time: 16.7 s — ABNORMAL HIGH (ref 11.4–15.2)

## 2024-01-02 LAB — LIPASE, BLOOD: Lipase: 31 U/L (ref 11–51)

## 2024-01-02 MED ORDER — LACTATED RINGERS IV BOLUS
1000.0000 mL | Freq: Once | INTRAVENOUS | Status: AC
  Administered 2024-01-02: 1000 mL via INTRAVENOUS

## 2024-01-02 MED ORDER — SODIUM CHLORIDE 0.9 % IV SOLN: 2.0000 g | Freq: Two times a day (BID) | INTRAVENOUS | Status: DC

## 2024-01-02 MED ORDER — SODIUM CHLORIDE 0.9 % IV SOLN
2.0000 g | Freq: Once | INTRAVENOUS | Status: AC
  Administered 2024-01-02: 2 g via INTRAVENOUS
  Filled 2024-01-02: qty 12.5

## 2024-01-02 MED ORDER — VANCOMYCIN HCL 2000 MG/400ML IV SOLN
2000.0000 mg | Freq: Once | INTRAVENOUS | Status: AC
  Administered 2024-01-02: 2000 mg via INTRAVENOUS
  Filled 2024-01-02: qty 400

## 2024-01-02 MED ORDER — ACETAMINOPHEN 500 MG PO TABS
1000.0000 mg | ORAL_TABLET | Freq: Once | ORAL | Status: AC
  Administered 2024-01-02: 1000 mg via ORAL
  Filled 2024-01-02: qty 2

## 2024-01-02 MED ORDER — VANCOMYCIN HCL 1500 MG/300ML IV SOLN: 1500.0000 mg | INTRAVENOUS | Status: DC

## 2024-01-02 NOTE — H&P (Signed)
 History and Physical    Patient: Jorge Gould GNF:[id number] DOB: 19-May-1956 DOA: 01/02/2024 DOS: the patient was seen and examined on 01/02/2024 PCP: Jac Canavan, PA-C  Patient coming from: Home  Chief Complaint:  Chief Complaint  Patient presents with   Fever   HPI: Jorge Gould is a 68 y.o. male with medical history significant of follicular lymphoma which is recurrent.  Patient has had chemo previously with has recurrence.  Essential hypertension, morbid obesity who presented to the ER with temperature 105.  He is having significant fever and chills.  Also some nausea but no vomiting.  Patient was seen and evaluated.  Initial workup was nonrevealing.  Patient now has tested positive for influenza A.  He meets sepsis criteria with temperature leukocytosis and lactic acidosis.  At this point has been admitted with sepsis secondary to possibly influenza versus cancer related.  He is not yet on chemotherapy.  Review of Systems: As mentioned in the history of present illness. All other systems reviewed and are negative. Past Medical History:  Diagnosis Date   Cancer (HCC) 2021   follicular lymphoma   Obesity    Past Surgical History:  Procedure Laterality Date   ANKLE FRACTURE SURGERY  2016   right, trauma repair after fall down stairs   HERNIA REPAIR     umbilical   IR IMAGING GUIDED PORT INSERTION  04/09/2020   IR IMAGING GUIDED PORT INSERTION  06/17/2022   IR REMOVAL TUN ACCESS W/ PORT W/O FL MOD SED  11/27/2020   TONSILLECTOMY     Social History:  reports that he has never smoked. He has never used smokeless tobacco. He reports current alcohol use of about 3.0 standard drinks of alcohol per week. He reports that he does not currently use drugs.  No Known Allergies  Family History  Problem Relation Age of Onset   Cancer Mother        lung; former smoker   COPD Father        emphysema   Diabetes Maternal Aunt    Heart disease Maternal Uncle    Heart disease  Maternal Uncle    Diabetes Maternal Aunt    Stroke Neg Hx    Hyperlipidemia Neg Hx    Hypertension Neg Hx     Prior to Admission medications   Medication Sig Start Date End Date Taking? Authorizing Provider  omeprazole (PRILOSEC) 40 MG capsule Take 40 mg by mouth daily. 12/22/23  Yes [provider]  acetaminophen (TYLENOL) 500 MG tablet Take 2 tablets (1,000 mg total) by mouth every 6 (six) hours as needed. 04/21/22   Carlisle Beers, FNP  amoxicillin-clavulanate (AUGMENTIN) 875-125 MG tablet Take 1 tablet by mouth 2 (two) times daily. 12/13/23   Tysinger, Kermit Balo, PA-C  benzonatate (TESSALON) 200 MG capsule Take 1 capsule (200 mg total) by mouth 3 (three) times daily as needed for cough. 12/13/23   Tysinger, Kermit Balo, PA-C  oxyCODONE (OXY IR/ROXICODONE) 5 MG immediate release tablet Take 1 tablet (5 mg total) by mouth every 4 (four) hours as needed for severe pain (pain score 7-10). 12/30/23   Jaci Standard, MD  predniSONE (DELTASONE) 20 MG tablet 3 tablets today, 2 tablets tomorrow, 1 tablet the third day 12/13/23   Tysinger, Kermit Balo, PA-C  valsartan (DIOVAN) 40 MG tablet Take 1 tablet (40 mg total) by mouth daily. 12/13/23 12/12/24  Jac Canavan, PA-C    Physical Exam: Vitals:   01/02/24 2050 01/02/24  2125 01/02/24 2139 01/02/24 2200  BP:  126/79  111/74  Pulse:  (!) 105  95  Resp:  (!) 27  (!) 21  Temp: (S) (!) 105 F (40.6 C)  (!) 101.2 F (38.4 C)   TempSrc: Rectal  Oral   SpO2:  98%  97%   Constitutional: Acutely ill looking no distress NAD, calm, comfortable Eyes: PERRL, lids and conjunctivae normal ENMT: Mucous membranes are moist. Posterior pharynx clear of any exudate or lesions.Normal dentition.  Neck: normal, supple, no masses, no thyromegaly Respiratory: clear to auscultation bilaterally, no wheezing, no crackles. Normal respiratory effort. No accessory muscle use.  Cardiovascular: Sinus tachycardia, no murmurs / rubs / gallops. No extremity edema. 2+ pedal  pulses. No carotid bruits.  Abdomen: no tenderness, no masses palpated. No hepatosplenomegaly. Bowel sounds positive.  Musculoskeletal: Good range of motion, no joint swelling or tenderness, Skin: no rashes, lesions, ulcers. No induration Neurologic: CN 2-12 grossly intact. Sensation intact, DTR normal. Strength 5/5 in all 4.  Psychiatric: Normal judgment and insight. Alert and oriented x 3. Normal mood  Data Reviewed:  Temperature 105 blood pressure 126/79, pulse 105,  influenza A is positive, PT 16.7 INR 1.3 white count is 15,000 hemoglobin 8.8 platelet 584.  Alkaline phosphatase 608 bili 2.9 AST 139 ALT 88 creatinine 2.33  Assessment and Plan:  #1 sepsis due to influenza pneumonia: Patient meets sepsis criteria with tachycardia, fever, leukocytosis.  It appears this could be from influenza A infection.  Patient however is relatively immunocompromised with follicular lymphoma.  Bacterial infection could be possible as well.  His fever could also be related to his follicular lymphoma.  Will admit the patient.  Treat for influenza A and empiric antibiotics.  #2 influenza A infection: Initiate Tamiflu and supportive care  #3 recurrent follicular lymphoma: Continue home regimen  #4 essential hypertension: Blood pressure appears controlled at this point.  Resume home regimen of Diovan if renal function allows.  #5 AKI: Appears to be prerenal.  Aggressively hydrate and monitor.  #6 hyperlipidemia: Confirm on resume home regimen  #7 follicular lymphoma: Patient is scheduled for initiation chemotherapy this week.  Probably chemotherapy may have to be delayed until he is better.  Will consult oncology in the morning.     Advance Care Planning:   Code Status: Full Code   Consults: Consult patient's oncologist in the morning  Family Communication: 2 children at bedside  Severity of Illness: The appropriate patient status for this patient is INPATIENT. Inpatient status is judged to be  reasonable and necessary in order to provide the required intensity of service to ensure the patient's safety. The patient's presenting symptoms, physical exam findings, and initial radiographic and laboratory data in the context of their chronic comorbidities is felt to place them at high risk for further clinical deterioration. Furthermore, it is not anticipated that the patient will be medically stable for discharge from the hospital within 2 midnights of admission.   * I certify that at the point of admission it is my clinical judgment that the patient will require inpatient hospital care spanning beyond 2 midnights from the point of admission due to high intensity of service, high risk for further deterioration and high frequency of surveillance required.*  AuthorLonia Blood, MD 01/02/2024 10:37 PM  For on call review [email or web address].

## 2024-01-02 NOTE — ED Provider Triage Note (Signed)
 Emergency Medicine Provider Triage Evaluation Note  TZVI ECONOMOU , a 68 y.o. male  was evaluated in triage.  Pt complains of fever, altered mental status, and has history of lymphoma.  Review of Systems  Positive: As above Negative: As above  Physical Exam  There were no vitals taken for this visit. Gen:   Awake, no distress   Resp:  Normal effort  MSK:   Moves extremities without difficulty  Other:    Medical Decision Making  Medically screening exam initiated at 7:50 PM.  Appropriate orders placed.  Riley Churches was informed that the remainder of the evaluation will be completed by another provider, this initial triage assessment does not replace that evaluation, and the importance of remaining in the ED until their evaluation is complete.  Sepsis labs ordered   Marita [place], [place] 01/02/24 [id number]

## 2024-01-02 NOTE — ED Notes (Signed)
 [name] daughter Judeth Cornfield requests notifications as able.

## 2024-01-02 NOTE — ED Provider Notes (Signed)
 [place] [facility]ENCY DEPARTMENT AT [facility] Provider Note   CSN: [id number] Arrival date & time: 01/02/24  1942     History Chief Complaint  Patient presents with   Fever    HPI Jorge Gould is a 68 y.o. male presenting for sudden onset fever, altered mental status throughout the day today.  Expansive medical history including cellular carcinoma on active chemotherapy. Developed confusion and fever today.  Brought in by family for further care and management.   [name] recorded medical, surgical, social, medication list and allergies were reviewed in the Snapshot window as part of the initial history.   Review of Systems   Review of Systems  Constitutional:  Positive for fatigue and fever. Negative for chills.  HENT:  Negative for ear pain and sore throat.   Eyes:  Negative for pain and visual disturbance.  Respiratory:  Negative for cough and shortness of breath.   Cardiovascular:  Negative for chest pain and palpitations.  Gastrointestinal:  Negative for abdominal pain and vomiting.  Genitourinary:  Negative for dysuria and hematuria.  Musculoskeletal:  Positive for myalgias. Negative for arthralgias, back pain and gait problem.  Skin:  Negative for color change and rash.  Neurological:  Negative for seizures and syncope.  Psychiatric/Behavioral:  Positive for confusion.   All other systems reviewed and are negative.   Physical Exam Updated Vital Signs BP 111/74   Pulse 95   Temp (!) 101.2 F (38.4 C) (Oral)   Resp (!) 21   SpO2 97%  Physical Exam Vitals and nursing note reviewed.  Constitutional:      General: He is not in acute distress.    Appearance: He is well-developed.  HENT:     Head: Normocephalic and atraumatic.  Eyes:     Conjunctiva/sclera: Conjunctivae normal.  Cardiovascular:     Rate and Rhythm: Regular rhythm. Tachycardia present.  Pulmonary:     Effort: Pulmonary effort is normal. No respiratory distress.  Abdominal:      General: Abdomen is flat. There is no distension.  Musculoskeletal:        General: No swelling or deformity.  Skin:    General: Skin is warm and dry.     Capillary Refill: Capillary refill takes less than 2 seconds.  Neurological:     Mental Status: He is alert and oriented to person, place, and time. Mental status is at baseline.      ED Course/ Medical Decision Making/ A&P    Procedures .Critical Care  Performed by: Glyn Ade, MD Authorized by: Glyn Ade, MD   Critical care provider statement:    Critical care time (minutes):  90   Critical care was time spent personally by me on the following activities:  Development of treatment plan with patient or surrogate, discussions with consultants, evaluation of patient's response to treatment, examination of patient, ordering and review of laboratory studies, ordering and review of radiographic studies, ordering and performing treatments and interventions, pulse oximetry, re-evaluation of patient's condition and review of old charts   Care discussed with: admitting provider      Medications Ordered in ED Medications  vancomycin (VANCOREADY) IVPB 2000 mg/400 mL (2,000 mg Intravenous New Bag/Given 01/02/24 2137)  ceFEPIme (MAXIPIME) 2 g in sodium chloride 0.9 % 100 mL IVPB (has no administration in time range)  lactated ringers bolus 1,000 mL (0 mLs Intravenous Stopped 01/02/24 2210)  ceFEPIme (MAXIPIME) 2 g in sodium chloride 0.9 % 100 mL IVPB (0 g Intravenous Stopped 01/02/24  2135)  acetaminophen (TYLENOL) tablet 1,000 mg (1,000 mg Oral Given 01/02/24 2102)  lactated ringers bolus 1,000 mL (1,000 mLs Intravenous New Bag/Given 01/02/24 2209)   Medical Decision Making:   NIC LAMPE is a 68 y.o. male who presented to the ED today with multiple symptoms detailed above.    Patient placed on continuous vitals and telemetry monitoring while in ED which was reviewed periodically.  Complete initial physical exam performed,  notably the patient  was ill-appearing. During this initial exam, patient met criteria for activation of code sepsis due to presence of the following SIRS criteria as well as suspected infectious etiology:tachypnea, tachycardia, fever, triage CBC with leukocytosis. Reviewed and confirmed nursing documentation for past medical history, family history, social history.    Initial Assessment:   With the patient's presentation of signs and symptoms of sepsis, most likely diagnosis is bacteremia secondary to underlying infection.  Considerations for source were initiated including:Urinary tract infections, abdominal infections such as cholecystitis/cholangitis/appendicitis, pulmonary etiology, bacteremia, skin etiology such as cellulitis or fasciitis, neurologic etiology such as meningitis or encephalitis.  This is most consistent with an acute life/limb threatening illness complicated by underlying chronic conditions.  Initial Plan:  Activated hospital protocol code sepsis including blood cultures, lactic acid screening, and further diagnostic care and management. Therapeutically, resuscitation fluids were considered. Patient has no contraindication to fluid resuscitation and therefore 30 cc of IV fluids per kilogram were administered Therapeutically, antibiotics were administered on a broad-spectrum nature. Undifferentiated source: Vancomycin and cefepime were administered to cover gram-positive and gram-negative high risk infections  Screening labs including CBC and Metabolic panel to evaluate for infectious or metabolic etiology of disease.  Urinalysis with reflex culture ordered to evaluate for UTI or relevant urologic/nephrologic pathology.  CXR to evaluate for structural/infectious intrathoracic pathology.  EKG to evaluate for cardiac pathology Objective evaluation as below reviewed   Initial Study Results:   Laboratory  All laboratory results reviewed without evidence of clinically relevant  pathology.    EKG EKG was reviewed independently. Rate, rhythm, axis, intervals all examined and without medically relevant abnormality. ST segments without concerns for elevations.    Radiology:  All images reviewed independently. Agree with radiology report at this time.   DG Chest Port 1 View Result Date: 01/02/2024 CLINICAL DATA:  Sepsis.  Fever. EXAM: PORTABLE CHEST 1 VIEW COMPARISON:  12/17/2023 FINDINGS: The right IJ Port-A-Cath is stable The cardiac silhouette, mediastinal and hilar contours are within normal limits. The lungs are clear of an acute process. No infiltrates or effusions. No pulmonary lesions. The bony thorax is intact. IMPRESSION: No acute cardiopulmonary findings. Electronically Signed   By: Rudie Meyer M.D.   On: 01/02/2024 21:48   NM PET Image Initial (PI) Skull Base To Thigh (F-18 FDG) Result Date: 12/29/2023 CLINICAL DATA:  Subsequent treatment strategy for colorectal carcinoma. History of lymphoma. EXAM: NUCLEAR MEDICINE PET SKULL BASE TO THIGH TECHNIQUE: 11.9 mCi F-18 FDG was injected intravenously. Full-ring PET imaging was performed from the skull base to thigh after the radiotracer. CT data was obtained and used for attenuation correction and anatomic localization. 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 Incidental CT findings: None. CHEST: No hypermetabolic mediastinal or hilar nodes. No suspicious pulmonary nodules on the CT scan. Incidental CT findings: Port in the anterior chest wall with tip in distal SVC. ABDOMEN/PELVIS: Innumerable round intensely hypermetabolic lesions throughout the  LEFT and RIGHT hepatic lobes. These correspond to the hypoenhancing lesion on comparison CT. Lesions are intense. For example RIGHT hepatic lobe lesion with SUV max equal 14.2. LEFT hepatic lobe lesion with SUV max equal 14.4 There is  hypermetabolic activity in the cecum on image 167 with SUV max equal 14.8. There is a hypermetabolic lymph node adjacent to the cecum along the medial border measuring 14 mm on image 163 with SUV max equal 6.7. Smaller hypermetabolic lymph node in the ileocecal mesentery on image 157 measures 9 mm with SUV max equal 5.8. There is a cluster of hypermetabolic lymph nodes in the LEFT inguinal nodal station with SUV max equal 7.2 on image 203. Incidental CT findings: None. SKELETON: No focal hypermetabolic activity to suggest skeletal metastasis. Incidental CT findings: None. IMPRESSION: 1. Innumerable hypermetabolic liver lesions consistent with hepatic metastasis. 2. Hypermetabolic cecal mass concerning for primary colorectal carcinoma. 3. Hypermetabolic lymph nodes adjacent to the cecum and in the ileocecal mesentery consistent with nodal metastasis. 4. Hypermetabolic lymph nodes in the LEFT inguinal nodal station consistent with nodal metastasis. 5. No evidence of metastatic adenopathy in the chest. 6. 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 Electronically Signed   By: Genevive Bi M.D.   On: 12/29/2023 10:24   US BIOPSY (LIVER) Result Date: 12/22/2023 INDICATION: History of follicular lymphoma with development of multiple liver lesions. EXAM: ULTRASOUND GUIDED CORE BIOPSY OF LIVER MASS MEDICATIONS: None. ANESTHESIA/SEDATION: Moderate (conscious) sedation was employed during this procedure. A total of Versed 1.0 mg and Fentanyl 50 mcg was administered intravenously. Moderate Sedation Time: 16 minutes. The patient's level of consciousness and vital signs were monitored continuously by radiology nursing throughout the procedure under my direct supervision. PROCEDURE: The procedure, risks, benefits, and alternatives were explained to the patient. Questions regarding the procedure were encouraged and answered. The patient understands and consents to the procedure. A  time-out was performed prior to initiating the procedure. The abdominal wall was prepped with chlorhexidine in a sterile fashion, and a sterile drape was applied covering the operative field. A sterile gown and sterile gloves were used for the procedure. Local anesthesia was provided with 1% Lidocaine. Ultrasound was performed to localize liver lesions. A lesion in the left lobe of the liver was chosen for biopsy. Under CT guidance, a 17 gauge trocar needle was advanced into the lesion. After confirming needle tip position, coaxial 18 gauge core biopsy samples were obtained. A total of 5 samples were submitted in both saline and formalin. The outer needle was removed and additional ultrasound performed. COMPLICATIONS: None immediate. FINDINGS: Rounded mass within the left lobe of the liver measures up to 5.7 cm in maximum diameter. Solid tissue was obtained. IMPRESSION: Ultrasound-guided core biopsy performed of a mass in the left lobe of the liver measuring up to 5.7 cm in maximum diameter. Electronically Signed   By: Irish Lack M.D.   On: 12/22/2023 09:40   CT ABDOMEN PELVIS W CONTRAST Result Date: 12/18/2023 CLINICAL DATA:  Abdominal pain, acute, nonlocalized. Shortness of breath. EXAM: CT ABDOMEN AND PELVIS WITH CONTRAST TECHNIQUE: Multidetector CT imaging of the abdomen and pelvis was performed using the standard protocol following bolus administration of intravenous contrast. RADIATION DOSE REDUCTION: This exam was performed according to the departmental dose-optimization program which includes automated exposure control, adjustment of the mA and/or kV according to patient size and/or use of iterative reconstruction technique. CONTRAST:  OMNIPAQUE IOHEXOL 350 MG/ML SOLN COMPARISON:  12/05/2023 FINDINGS:  Lower chest: No acute abnormality Hepatobiliary: Numerous hepatic lesions are again noted similar prior study compatible with metastases. Gallbladder unremarkable. Pancreas: No focal abnormality or  ductal dilatation. Spleen: No focal abnormality.  Normal size. Adrenals/Urinary Tract: No adrenal abnormality. No focal renal abnormality. No stones or hydronephrosis. Urinary bladder is unremarkable. Stomach/Bowel: Stomach, large and small bowel grossly unremarkable. Normal appendix. Vascular/Lymphatic: Aortic atherosclerosis. No aneurysm. Multiple enlarged retroperitoneal, inguinal and central mesenteric lymph nodes, stable since prior study. Reproductive: No visible focal abnormality. Other: No free fluid or free air. Small left inguinal hernia containing fat. Musculoskeletal: No acute bony abnormality. IMPRESSION: Continued numerous large masses throughout the liver compatible with metastases, unchanged since recent study. Unchanged multi station lymphadenopathy. Aortic atherosclerosis. Electronically Signed   By: Charlett Nose M.D.   On: 12/18/2023 00:57   CT Angio Chest PE W/Cm &/Or Wo Cm Result Date: 12/18/2023 CLINICAL DATA:  Pulmonary embolism (PE) suspected, low to intermediate prob, positive D-dimer. Shortness of breath EXAM: CT ANGIOGRAPHY CHEST WITH CONTRAST TECHNIQUE: Multidetector CT imaging of the chest was performed using the standard protocol during bolus administration of intravenous contrast. Multiplanar CT image reconstructions and MIPs were obtained to evaluate the vascular anatomy. RADIATION DOSE REDUCTION: This exam was performed according to the departmental dose-optimization program which includes automated exposure control, adjustment of the mA and/or kV according to patient size and/or use of iterative reconstruction technique. CONTRAST:  OMNIPAQUE IOHEXOL 350 MG/ML SOLN COMPARISON:  12/05/2023 FINDINGS: Cardiovascular: No filling defects in the pulmonary arteries to suggest pulmonary emboli. Heart mildly enlarged. Scattered coronary artery and aortic atherosclerosis. Mediastinum/Nodes: No mediastinal, hilar, or axillary adenopathy. Trachea and esophagus are unremarkable. Thyroid  unremarkable. Lungs/Pleura: Lungs are clear. No focal airspace opacities or suspicious nodules. No effusions. Upper Abdomen: See abdominal CT report Musculoskeletal: Chest wall soft tissues are unremarkable. No acute bony abnormality. Review of the MIP images confirms the above findings. IMPRESSION: No evidence of pulmonary embolus. No acute cardiopulmonary disease. Coronary artery disease. Aortic Atherosclerosis (ICD10-I70.0). Electronically Signed   By: Charlett Nose M.D.   On: 12/18/2023 00:54   DG Chest Port 1 View Result Date: 12/17/2023 CLINICAL DATA:  Worsening shortness of breath. EXAM: PORTABLE CHEST 1 VIEW COMPARISON:  April 21, 2022 FINDINGS: A right-sided venous Port-A-Cath is in place, with its distal tip seen at the junction of the superior vena cava and right atrium. The heart size and mediastinal contours are within normal limits. Both lungs are clear. The visualized skeletal structures are unremarkable. IMPRESSION: No active disease. Electronically Signed   By: Aram Candela M.D.   On: 12/17/2023 22:22   CT CHEST ABDOMEN PELVIS W CONTRAST Result Date: 12/05/2023 CLINICAL DATA:  Hematologic malignancy, assess treatment response EXAM: CT CHEST, ABDOMEN, AND PELVIS WITH CONTRAST TECHNIQUE: Multidetector CT imaging of the chest, abdomen and pelvis was performed following the standard protocol during bolus administration of intravenous contrast. RADIATION DOSE REDUCTION: This exam was performed according to the departmental dose-optimization program which includes automated exposure control, adjustment of the mA and/or kV according to patient size and/or use of iterative reconstruction technique. CONTRAST:  OMNIPAQUE IOHEXOL 300 MG/ML  SOLN COMPARISON:  CT chest abdomen and pelvis 06/10/2023. FINDINGS: CT CHEST FINDINGS Cardiovascular: Right chest wall infusion port with the distal tip at the superior cavoatrial junction. No acute vascular abnormality. Normal heart size. No pericardial  effusion. Mediastinum/Nodes: No enlarged mediastinal, hilar, or axillary lymph nodes. Thyroid gland, trachea, and esophagus demonstrate no significant findings. Lungs/Pleura: Lungs are clear. No pleural effusion  or pneumothorax. Musculoskeletal: No chest wall mass or suspicious bone lesions identified. CT ABDOMEN PELVIS FINDINGS Hepatobiliary: Numerous new hypoattenuating hepatic lesions, the largest measuring up to 10 cm in the left lobe (2:58). A 7 mm filling defect in the proximal right portal vein (2:62) may represent thrombus or tumor invasion. Normal gallbladder. No biliary ductal dilation. Pancreas: Unremarkable. No pancreatic ductal dilatation or surrounding inflammatory changes. Spleen: Normal in size without focal abnormality. Adrenals/Urinary Tract: Adrenal glands are unremarkable. Kidneys are normal, without renal calculi, focal lesion, or hydronephrosis. Bladder is unremarkable. Stomach/Bowel: Stomach is within normal limits. Appendix appears normal. No evidence of bowel wall thickening, distention, or inflammatory changes. Vascular/Lymphatic: Aortic atherosclerosis. Multiple enlarged lymph nodes in the retroperitoneal, periaortic, and inguinal regions. A left periaortic node measuring 3.2 x 2.3 cm (2:76) as well as a dominant 3 cm left inguinal node (2:130), appear similar from prior. Reproductive: Prostatomegaly. Other: No abdominal wall hernia or abnormality. No abdominopelvic ascites. Musculoskeletal: No acute or significant osseous findings. IMPRESSION: 1. Multiple new hepatic lesions highly suggestive for metastatic disease. 2. Subcentimeter filling defect in the proximal right portal vein may represent artifact, nonocclusive thrombus, or tumor invasion. 3. Overall stable multi station lymphadenopathy as described. A communications note was sent to the ordering provider's office on 12/05/2023 at 15:00 Electronically Signed   By: Levi Aland M.D.   On: 12/05/2023 15:09   Final Assessment and  Plan:   [name] history of present on his physical exam findings are most consistent with acute infection uncertain etiology.  He was treated as a code sepsis with IV antibiotics. Fever improved with p.o. acetaminophen. Antibiotics administered broadly.  Has mild AKI.  Treated with IV fluid.  Flu positive likely etiology but given active treatment in the outpatient setting he will be admitted for further care and management.  Disposition:   Based on the above findings, I believe this patient is stable for admission.    Patient/family educated about specific findings on our evaluation and explained exact reasons for admission.  Patient/family educated about clinical situation and time was allowed to answer questions.   Admission team communicated with and agreed with need for admission. Patient admitted. Patient ready to move at this time.     Emergency Department Medication Summary:   Medications  vancomycin (VANCOREADY) IVPB 2000 mg/400 mL (2,000 mg Intravenous New Bag/Given 01/02/24 2137)  ceFEPIme (MAXIPIME) 2 g in sodium chloride 0.9 % 100 mL IVPB (has no administration in time range)  lactated ringers bolus 1,000 mL (0 mLs Intravenous Stopped 01/02/24 2210)  ceFEPIme (MAXIPIME) 2 g in sodium chloride 0.9 % 100 mL IVPB (0 g Intravenous Stopped 01/02/24 2135)  acetaminophen (TYLENOL) tablet 1,000 mg (1,000 mg Oral Given 01/02/24 2102)  lactated ringers bolus 1,000 mL (1,000 mLs Intravenous New Bag/Given 01/02/24 2209)          Clinical Impression:  1. Febrile illness      Admit   Clinical Impression:  1. Febrile illness      Admit   Final Clinical Impression(s) / ED Diagnoses Final diagnoses:  Febrile illness    Rx / DC Orders ED Discharge Orders     None         Glyn Ade, MD 01/02/24 2259

## 2024-01-02 NOTE — Progress Notes (Signed)
 ED Pharmacy Antibiotic Sign Off An antibiotic consult was received from an ED provider for vancomycin per pharmacy dosing for sepsis. A chart review was completed to assess appropriateness.   Weight: 108 kg  The following one time order(s) were placed:  Vancomycin 2000 mg IV once  Further antibiotic and/or antibiotic pharmacy consults should be ordered by the admitting provider if indicated.   Thank you for allowing pharmacy to be a part of this patient's care.   Cindi Carbon, PharmD 01/02/24 9:00 PM

## 2024-01-02 NOTE — Progress Notes (Signed)
 Pharmacy Antibiotic Note  Jorge Gould is a 68 y.o. male admitted on 01/02/2024 with sepsis.  PMH significant for follicular lymphoma, HTN.  Pharmacy has been consulted for Vancomycin and Cefepime dosing.  Plan: Vancomycin 1500 mg IV Q 48 hrs. Goal AUC 400-550.  Expected AUC: 480.9  SCr used: 2.6 Cefepime 2gm IV q12h Follow renal function F/u culture results & sensitivities     Temp (24hrs), Avg:102.7 F (39.3 C), Min:101.2 F (38.4 C), Max:105 F (40.6 C)  Recent Labs  Lab 12/30/23 1254 01/02/24 2100 01/02/24 2106 01/02/24 2112  WBC 11.7* 15.0*  --   --   CREATININE 1.04 2.33* 2.60*  --   LATICACIDVEN  --   --   --  3.5*    Estimated Creatinine Clearance: 34.1 mL/min (A) (by C-G formula based on SCr of 2.6 mg/dL (H)).    No Known Allergies  Antimicrobials this admission: 2/24 Vancomycin >>   2/24 Cefepime >>    Dose adjustments this admission:    Microbiology results: 2/24 BCx:   2/24 Influenza A +    Thank you for allowing pharmacy to be a part of this patient's care.  Maryellen Pile, PharmD 01/02/2024 11:00 PM

## 2024-01-02 NOTE — ED Triage Notes (Signed)
 Pt BIB ems from home with reports of a fever, pt is slightly altered (not at baseline).

## 2024-01-03 ENCOUNTER — Encounter: Payer: Self-pay | Admitting: Hematology and Oncology

## 2024-01-03 ENCOUNTER — Other Ambulatory Visit (HOSPITAL_COMMUNITY): Payer: Medicare Other

## 2024-01-03 DIAGNOSIS — A021 Salmonella sepsis: Secondary | ICD-10-CM | POA: Diagnosis not present

## 2024-01-03 DIAGNOSIS — G9341 Metabolic encephalopathy: Secondary | ICD-10-CM

## 2024-01-03 DIAGNOSIS — R6520 Severe sepsis without septic shock: Secondary | ICD-10-CM

## 2024-01-03 LAB — COMPREHENSIVE METABOLIC PANEL
ALT: 68 U/L — ABNORMAL HIGH (ref 0–44)
AST: 114 U/L — ABNORMAL HIGH (ref 15–41)
Albumin: 2.4 g/dL — ABNORMAL LOW (ref 3.5–5.0)
Alkaline Phosphatase: 491 U/L — ABNORMAL HIGH (ref 38–126)
Anion gap: 11 (ref 5–15)
BUN: 43 mg/dL — ABNORMAL HIGH (ref 8–23)
CO2: 20 mmol/L — ABNORMAL LOW (ref 22–32)
Calcium: 8.4 mg/dL — ABNORMAL LOW (ref 8.9–10.3)
Chloride: 104 mmol/L (ref 98–111)
Creatinine, Ser: 1.83 mg/dL — ABNORMAL HIGH (ref 0.61–1.24)
GFR, Estimated: 39 mL/min — ABNORMAL LOW (ref >60)
Glucose, Bld: 126 mg/dL — ABNORMAL HIGH (ref 70–99)
Potassium: 4.6 mmol/L (ref 3.5–5.1)
Sodium: 135 mmol/L (ref 135–145)
Total Bilirubin: 1.7 mg/dL — ABNORMAL HIGH (ref 0.0–1.2)
Total Protein: 5.5 g/dL — ABNORMAL LOW (ref 6.5–8.1)

## 2024-01-03 LAB — HIV ANTIBODY (ROUTINE TESTING W REFLEX): HIV Screen 4th Generation wRfx: NONREACTIVE

## 2024-01-03 LAB — CBC
HCT: 24.8 % — ABNORMAL LOW (ref 39.0–52.0)
HCT: 23.3 % — ABNORMAL LOW (ref 39.0–52.0)
Hemoglobin: 7.8 g/dL — ABNORMAL LOW (ref 13.0–17.0)
Hemoglobin: 7.3 g/dL — ABNORMAL LOW (ref 13.0–17.0)
MCH: 24.8 pg — ABNORMAL LOW (ref 26.0–34.0)
MCH: 24.9 pg — ABNORMAL LOW (ref 26.0–34.0)
MCHC: 31.5 g/dL (ref 30.0–36.0)
MCHC: 31.3 g/dL (ref 30.0–36.0)
MCV: 79 fL — ABNORMAL LOW (ref 80.0–100.0)
MCV: 79.5 fL — ABNORMAL LOW (ref 80.0–100.0)
Platelets: 419 10*3/uL — ABNORMAL HIGH (ref 150–400)
Platelets: 345 10*3/uL (ref 150–400)
RBC: 3.14 MIL/uL — ABNORMAL LOW (ref 4.22–5.81)
RBC: 2.93 MIL/uL — ABNORMAL LOW (ref 4.22–5.81)
RDW: 16.6 % — ABNORMAL HIGH (ref 11.5–15.5)
RDW: 16.4 % — ABNORMAL HIGH (ref 11.5–15.5)
WBC: 11.6 10*3/uL — ABNORMAL HIGH (ref 4.0–10.5)
WBC: 9.6 10*3/uL (ref 4.0–10.5)
nRBC: 0 % (ref 0.0–0.2)
nRBC: 0 % (ref 0.0–0.2)

## 2024-01-03 LAB — LACTIC ACID, PLASMA
Lactic Acid, Venous: 3.2 mmol/L (ref 0.5–1.9)
Lactic Acid, Venous: 2.5 mmol/L (ref 0.5–1.9)

## 2024-01-03 LAB — CORTISOL-AM, BLOOD: Cortisol - AM: 29.3 ug/dL — ABNORMAL HIGH (ref 6.7–22.6)

## 2024-01-03 LAB — PROTIME-INR
INR: 1.5 — ABNORMAL HIGH (ref 0.8–1.2)
Prothrombin Time: 17.9 s — ABNORMAL HIGH (ref 11.4–15.2)

## 2024-01-03 LAB — I-STAT CG4 LACTIC ACID, ED: Lactic Acid, Venous: 8.3 mmol/L (ref 0.5–1.9)

## 2024-01-03 LAB — CREATININE, SERUM
Creatinine, Ser: 2.31 mg/dL — ABNORMAL HIGH (ref 0.61–1.24)
GFR, Estimated: 30 mL/min — ABNORMAL LOW (ref >60)

## 2024-01-03 LAB — TROPONIN I (HIGH SENSITIVITY): Troponin I (High Sensitivity): 27 ng/L — ABNORMAL HIGH (ref <18)

## 2024-01-03 MED ORDER — SODIUM CHLORIDE 0.9 % IV SOLN
2.0000 g | Freq: Once | INTRAVENOUS | Status: DC
  Filled 2024-01-03: qty 12.5

## 2024-01-03 MED ORDER — METOPROLOL TARTRATE 5 MG/5ML IV SOLN: 5.0000 mg | INTRAVENOUS | Status: DC | PRN

## 2024-01-03 MED ORDER — ONDANSETRON HCL 4 MG PO TABS
4.0000 mg | ORAL_TABLET | Freq: Four times a day (QID) | ORAL | Status: DC | PRN
Start: 2024-01-03 — End: 2024-01-05

## 2024-01-03 MED ORDER — METRONIDAZOLE 500 MG/100ML IV SOLN
500.0000 mg | Freq: Two times a day (BID) | INTRAVENOUS | Status: DC
  Administered 2024-01-03: 500 mg via INTRAVENOUS
  Filled 2024-01-03: qty 100

## 2024-01-03 MED ORDER — CALCIUM GLUCONATE-NACL 2-0.675 GM/100ML-% IV SOLN
2.0000 g | Freq: Once | INTRAVENOUS | Status: AC
Start: 2024-01-03 — End: 2024-01-03
  Administered 2024-01-03: 2000 mg via INTRAVENOUS
  Filled 2024-01-03: qty 100

## 2024-01-03 MED ORDER — SODIUM CHLORIDE 0.9 % IV SOLN: INTRAVENOUS | Status: AC

## 2024-01-03 MED ORDER — ENOXAPARIN SODIUM 40 MG/0.4ML IJ SOSY
40.0000 mg | PREFILLED_SYRINGE | INTRAMUSCULAR | Status: DC
  Administered 2024-01-03 – 2024-01-04 (×2): 40 mg via SUBCUTANEOUS
  Filled 2024-01-03 (×2): qty 0.4

## 2024-01-03 MED ORDER — IPRATROPIUM-ALBUTEROL 0.5-2.5 (3) MG/3ML IN SOLN
3.0000 mL | RESPIRATORY_TRACT | Status: DC | PRN
  Administered 2024-01-04 – 2024-01-05 (×3): 3 mL via RESPIRATORY_TRACT
  Filled 2024-01-03 (×3): qty 3

## 2024-01-03 MED ORDER — LACTATED RINGERS IV SOLN
150.0000 mL/h | INTRAVENOUS | Status: DC
  Administered 2024-01-03: 150 mL/h via INTRAVENOUS

## 2024-01-03 MED ORDER — GUAIFENESIN 100 MG/5ML PO LIQD
5.0000 mL | ORAL | Status: DC | PRN
  Administered 2024-01-03 – 2024-01-04 (×3): 5 mL via ORAL
  Filled 2024-01-03 (×3): qty 10

## 2024-01-03 MED ORDER — SENNOSIDES-DOCUSATE SODIUM 8.6-50 MG PO TABS
1.0000 | ORAL_TABLET | Freq: Every evening | ORAL | Status: DC | PRN
Start: 2024-01-03 — End: 2024-01-05

## 2024-01-03 MED ORDER — OSELTAMIVIR PHOSPHATE 75 MG PO CAPS
75.0000 mg | ORAL_CAPSULE | Freq: Once | ORAL | Status: AC
  Administered 2024-01-03: 75 mg via ORAL
  Filled 2024-01-03: qty 1

## 2024-01-03 MED ORDER — ACETAMINOPHEN 325 MG PO TABS
650.0000 mg | ORAL_TABLET | Freq: Four times a day (QID) | ORAL | Status: DC | PRN
  Administered 2024-01-03 – 2024-01-05 (×6): 650 mg via ORAL
  Filled 2024-01-03 (×6): qty 2

## 2024-01-03 MED ORDER — OSELTAMIVIR PHOSPHATE 30 MG PO CAPS
30.0000 mg | ORAL_CAPSULE | Freq: Two times a day (BID) | ORAL | Status: DC
Start: 2024-01-03 — End: 2024-01-08
  Administered 2024-01-03 – 2024-01-05 (×4): 30 mg via ORAL
  Filled 2024-01-03 (×4): qty 1

## 2024-01-03 MED ORDER — TRAZODONE HCL 50 MG PO TABS: 50.0000 mg | ORAL_TABLET | Freq: Every evening | ORAL | Status: DC | PRN

## 2024-01-03 MED ORDER — HYDRALAZINE HCL 20 MG/ML IJ SOLN: 10.0000 mg | INTRAMUSCULAR | Status: DC | PRN

## 2024-01-03 MED ORDER — SODIUM CHLORIDE 0.9% FLUSH: 10.0000 mL | INTRAVENOUS | Status: DC | PRN

## 2024-01-03 MED ORDER — OXYCODONE HCL 5 MG PO TABS
5.0000 mg | ORAL_TABLET | ORAL | Status: DC | PRN
  Administered 2024-01-04: 5 mg via ORAL
  Filled 2024-01-03: qty 1

## 2024-01-03 MED ORDER — SODIUM CHLORIDE 0.9 % IV BOLUS
500.0000 mL | Freq: Once | INTRAVENOUS | Status: AC
Start: 2024-01-03 — End: 2024-01-03
  Administered 2024-01-03: 500 mL via INTRAVENOUS

## 2024-01-03 MED ORDER — SODIUM CHLORIDE 0.9% FLUSH
10.0000 mL | Freq: Two times a day (BID) | INTRAVENOUS | Status: DC
Start: 2024-01-03 — End: 2024-01-05
  Administered 2024-01-04 (×2): 10 mL

## 2024-01-03 MED ORDER — ONDANSETRON HCL 4 MG/2ML IJ SOLN
4.0000 mg | Freq: Four times a day (QID) | INTRAMUSCULAR | Status: DC | PRN
Start: 2024-01-03 — End: 2024-01-05

## 2024-01-03 MED ORDER — VANCOMYCIN HCL IN DEXTROSE 1-5 GM/200ML-% IV SOLN: 1000.0000 mg | Freq: Once | INTRAVENOUS | Status: DC

## 2024-01-03 MED ORDER — IRBESARTAN 75 MG PO TABS
75.0000 mg | ORAL_TABLET | Freq: Every day | ORAL | Status: DC
  Filled 2024-01-03: qty 1

## 2024-01-03 NOTE — Progress Notes (Signed)
 PROGRESS NOTE    Jorge Gould  ZOX:[id number] DOB: Jun 18, 1956 DOA: 01/02/2024 PCP: Jac Canavan, PA-C    Brief Narrative:  68 year old with history of follicular lymphoma, recurrent previously treated with chemotherapy, essential hypertension, morbid obesity came to the hospital with fevers and chills.   Assessment & Plan:  Principal Problem:   Sepsis (HCC) Active Problems:   Dyslipidemia   Follicular lymphoma of intra-abdominal lymph nodes (HCC)   Follicular lymphoma grade IIIa of intra-abdominal lymph nodes (HCC)   Colon cancer metastasized to liver (HCC)   Influenza A with pneumonia   AKI (acute kidney injury) (HCC)   Sepsis secondary to influenza infection - Respiratory panel is positive for influenza A infection.  Blood cultures are negative.  Patient has been started on supportive therapy including bronchodilators and Tamiflu. - Discontinue vancomycin and cefepime - I-S/flutter valve  Recurrent follicular lymphoma - Follow outpatient hematology oncology on active chemotherapy.  Acute kidney injury Lactic acidosis -Baseline creatinine 1.0, admission creatinine 2.3 which peaked at 2.6.  Continue IV fluids.  Discontinue Avapro -Repeat lactate  Essential hypertension - IV as needed  Hyperlipidemia - Does not appear to be on home medications    DVT prophylaxis: enoxaparin (LOVENOX) injection 40 mg Start: 01/03/24 1000    Code Status: Full Code Family Communication:   Status is: Inpatient Remains inpatient appropriate because: Continue hospital stay for management of influenza and AKI    Subjective: Feeling better.  Asking me if he can go to work.  I explained to him given his AKI, he will need to remain in the hospital   Examination:  General exam: Appears calm and comfortable  Respiratory system: Clear to auscultation. Respiratory effort normal. Cardiovascular system: S1 & S2 heard, RRR. No JVD, murmurs, rubs, gallops or clicks. No pedal  edema. Gastrointestinal system: Abdomen is nondistended, soft and nontender. No organomegaly or masses felt. Normal bowel sounds heard. Central nervous system: Alert and oriented. No focal neurological deficits. Extremities: Symmetric 5 x 5 power. Skin: No rashes, lesions or ulcers Psychiatry: Judgement and insight appear normal. Mood & affect appropriate.                Diet Orders (From admission, onward)     Start     Ordered   01/03/24 0410  Diet Heart Room service appropriate? Yes; Fluid consistency: Thin  Diet effective now       Question Answer Comment  Room service appropriate? Yes   Fluid consistency: Thin      01/03/24 0409            Objective: Vitals:   01/03/24 0431 01/03/24 0515 01/03/24 0600 01/03/24 0933  BP:   (!) 137/92 (!) 163/101  Pulse:  94 (!) 104 (!) 101  Resp:  (!) 28 (!) 28 18  Temp: (!) 101.3 F (38.5 C)  100 F (37.8 C) 98.4 F (36.9 C)  TempSrc: Oral  Oral Oral  SpO2:  95% 95% 95%    Intake/Output Summary (Last 24 hours) at 01/03/2024 1206 Last data filed at 01/03/2024 8119 Gross per 24 hour  Intake 240 ml  Output 400 ml  Net -160 ml   There were no vitals filed for this visit.  Scheduled Meds:  enoxaparin (LOVENOX) injection  40 mg Subcutaneous Q24H   oseltamivir  30 mg Oral BID   Continuous Infusions:  sodium chloride 75 mL/hr at 01/03/24 0919   calcium gluconate 2,000 mg (01/03/24 1116)    Nutritional status  There is no height or weight on file to calculate BMI.  Data Reviewed:   CBC: Recent Labs  Lab 12/30/23 1254 01/02/24 2100 01/02/24 2106 01/03/24 0424  WBC 11.7* 15.0*  --  11.6*  NEUTROABS 9.9* 12.9*  --   --   HGB 8.5* 8.8* 9.5* 7.8*  HCT 26.9* 28.0* 28.0* 24.8*  MCV 79.4* 78.7*  --  79.0*  PLT 507* 584*  --  419*   Basic Metabolic Panel: Recent Labs  Lab 12/30/23 1254 01/02/24 2100 01/02/24 2106 01/03/24 0424  NA 132* 135 134*  --   K 4.2 5.0 5.0  --   CL 98 101 103  --   CO2 26  20*  --   --   GLUCOSE 137* 113* 108*  --   BUN 22 49* 47*  --   CREATININE 1.04 2.33* 2.60* 2.31*  CALCIUM 8.9 8.8*  --   --    GFR: Estimated Creatinine Clearance: 38.3 mL/min (A) (by C-G formula based on SCr of 2.31 mg/dL (H)). Liver Function Tests: Recent Labs  Lab 12/30/23 1254 01/02/24 2100  AST 120* 139*  ALT 63* 88*  ALKPHOS 639* 608*  BILITOT 1.2 1.1  PROT 6.2* 6.7  ALBUMIN 3.3* 2.9*   Recent Labs  Lab 01/02/24 2100  LIPASE 31   No results for input(s): "AMMONIA" in the last 168 hours. Coagulation Profile: Recent Labs  Lab 01/02/24 2100 01/03/24 0424  INR 1.3* 1.5*   Cardiac Enzymes: No results for input(s): "CKTOTAL", "CKMB", "CKMBINDEX", "TROPONINI" in the last 168 hours. BNP (last 3 results) No results for input(s): "PROBNP" in the last 8760 hours. HbA1C: No results for input(s): "HGBA1C" in the last 72 hours. CBG: Recent Labs  Lab 12/28/23 1230  GLUCAP 90   Lipid Profile: No results for input(s): "CHOL", "HDL", "LDLCALC", "TRIG", "CHOLHDL", "LDLDIRECT" in the last 72 hours. Thyroid Function Tests: No results for input(s): "TSH", "T4TOTAL", "FREET4", "T3FREE", "THYROIDAB" in the last 72 hours. Anemia Panel: No results for input(s): "VITAMINB12", "FOLATE", "FERRITIN", "TIBC", "IRON", "RETICCTPCT" in the last 72 hours. Sepsis Labs: Recent Labs  Lab 01/02/24 2112 01/03/24 0003 01/03/24 1108  LATICACIDVEN 3.5* 8.3* 3.2*    Recent Results (from the past 240 hours)  Resp panel by RT-PCR (RSV, Flu A&B, Covid) Anterior Nasal Swab     Status: Abnormal   Collection Time: 01/02/24  8:19 PM   Specimen: Anterior Nasal Swab  Result Value Ref Range Status   SARS Coronavirus 2 by RT PCR NEGATIVE NEGATIVE Final    Comment: (NOTE) SARS-CoV-2 target nucleic acids are NOT DETECTED.  The SARS-CoV-2 RNA is generally detectable in upper respiratory specimens during the acute phase of infection. The lowest concentration of SARS-CoV-2 viral copies this assay  can detect is 138 copies/mL. A negative result does not preclude SARS-Cov-2 infection and should not be used as the sole basis for treatment or other patient management decisions. A negative result may occur with  improper specimen collection/handling, submission of specimen other than nasopharyngeal swab, presence of viral mutation(s) within the areas targeted by this assay, and inadequate number of viral copies(<138 copies/mL). A negative result must be combined with clinical observations, patient history, and epidemiological information. The expected result is Negative.  Fact Sheet for Patients:  BloggerCourse.com  Fact Sheet for Healthcare Providers:  SeriousBroker.it  This test is no t yet approved or cleared by the [place] FDA and  has been authorized for detection and/or diagnosis of SARS-CoV-2 by FDA under an Emergency Use  Authorization (EUA). This EUA will remain  in effect (meaning this test can be used) for the duration of the COVID-19 declaration under Section 564(b)(1) of the Act, 21 U.S.C.section 360bbb-3(b)(1), unless the authorization is terminated  or revoked sooner.       Influenza A by PCR POSITIVE (A) NEGATIVE Final   Influenza B by PCR NEGATIVE NEGATIVE Final    Comment: (NOTE) The Xpert Xpress SARS-CoV-2/FLU/RSV plus assay is intended as an aid in the diagnosis of influenza from Nasopharyngeal swab specimens and should not be used as a sole basis for treatment. Nasal washings and aspirates are unacceptable for Xpert Xpress SARS-CoV-2/FLU/RSV testing.  Fact Sheet for Patients: BloggerCourse.com  Fact Sheet for Healthcare Providers: SeriousBroker.it  This test is not yet approved or cleared by the [place] FDA and has been authorized for detection and/or diagnosis of SARS-CoV-2 by FDA under an Emergency Use Authorization (EUA). This EUA will  remain in effect (meaning this test can be used) for the duration of the COVID-19 declaration under Section 564(b)(1) of the Act, 21 U.S.C. section 360bbb-3(b)(1), unless the authorization is terminated or revoked.     Resp Syncytial Virus by PCR NEGATIVE NEGATIVE Final    Comment: (NOTE) Fact Sheet for Patients: BloggerCourse.com  Fact Sheet for Healthcare Providers: SeriousBroker.it  This test is not yet approved or cleared by the [place] FDA and has been authorized for detection and/or diagnosis of SARS-CoV-2 by FDA under an Emergency Use Authorization (EUA). This EUA will remain in effect (meaning this test can be used) for the duration of the COVID-19 declaration under Section 564(b)(1) of the Act, 21 U.S.C. section 360bbb-3(b)(1), unless the authorization is terminated or revoked.  Performed at [facility], [address] [place], [place] [address]   Blood Culture (routine x 2)     Status: None (Preliminary result)   Collection Time: 01/02/24  9:00 PM   Specimen: BLOOD  Result Value Ref Range Status   Specimen Description   Final    BLOOD RIGHT ANTECUBITAL Performed at [facility], [address] [place], [place] [address]    Special Requests   Final    BOTTLES DRAWN AEROBIC AND ANAEROBIC Blood Culture adequate volume Performed at [facility], [address], [place], [place] [address]    Culture   Final    NO GROWTH < 12 HOURS Performed at [facility], [address], [place], [place] [address]    Report Status PENDING  Incomplete  Blood Culture (routine x 2)     Status: None (Preliminary result)   Collection Time: 01/02/24  9:01 PM   Specimen: BLOOD  Result Value Ref Range Status   Specimen Description   Final    BLOOD BLOOD LEFT HAND Performed at [facility], [address], [place], [place] [address]    Special  Requests   Final    BOTTLES DRAWN AEROBIC ONLY Blood Culture results may not be optimal due to an inadequate volume of blood received in culture bottles Performed at [facility], [address], [place], [place] [address]    Culture   Final    NO GROWTH < 12 HOURS Performed at [facility], [address] [place], [place] [address]    Report Status PENDING  Incomplete         Radiology Studies: DG Chest Port 1 View Result Date: 01/02/2024 CLINICAL DATA:  Sepsis.  Fever. EXAM: PORTABLE CHEST  1 VIEW COMPARISON:  12/17/2023 FINDINGS: The right IJ Port-A-Cath is stable The cardiac silhouette, mediastinal and hilar contours are within normal limits. The lungs are clear of an acute process. No infiltrates or effusions. No pulmonary lesions. The bony thorax is intact. IMPRESSION: No acute cardiopulmonary findings. Electronically Signed   By: Rudie Meyer M.D.   On: 01/02/2024 21:48           LOS: 1 day   Time spent= 35 mins    Miguel Rota, MD [facility]  If 7PM-7AM, please contact night-coverage  01/03/2024, 12:06 PM

## 2024-01-03 NOTE — Hospital Course (Addendum)
 Brief Narrative:  68 year old with history of follicular lymphoma, recurrent previously treated with chemotherapy, essential hypertension, morbid obesity came to the hospital with fevers and chills.   Assessment & Plan:  Principal Problem:   Sepsis (HCC) Active Problems:   Dyslipidemia   Follicular lymphoma of intra-abdominal lymph nodes (HCC)   Follicular lymphoma grade IIIa of intra-abdominal lymph nodes (HCC)   Colon cancer metastasized to liver (HCC)   Influenza A with pneumonia   AKI (acute kidney injury) (HCC)   Sepsis secondary to influenza infection - Respiratory panel is positive for influenza A infection.  Blood cultures are negative.  Patient has been started on supportive therapy including bronchodilators and Tamiflu. - Discontinue vancomycin and cefepime - I-S/flutter valve  Recurrent follicular lymphoma Anemia of chronic disease - Follow outpatient hematology oncology on active chemotherapy. -Admission hemoglobin 9.5, slowly trending down 6.8.  Appears to be delusional in nature.  No evidence of bleeding - Transfusion ordered. Dr Leonides Schanz aware  Acute kidney injury Lactic acidosis -Baseline creatinine 1.0, admission creatinine 2.3 which peaked at 2.6 > 1.62.  Continue IV fluids.  Discontinue Avapro - Repeat lactate trending down  Essential hypertension - IV as needed  Hyperlipidemia - Does not appear to be on home medications    DVT prophylaxis: enoxaparin (LOVENOX) injection 40 mg Start: 01/03/24 1000    Code Status: Full Code Family Communication:   Status is: Inpatient Remains inpatient appropriate because: Continue hospital stay for management of influenza and AKI   Subjective: Feeling better. No signs of any bleeding.   Examination:  General exam: Appears calm and comfortable  Respiratory system: Clear to auscultation. Respiratory effort normal. Cardiovascular system: S1 & S2 heard, RRR. No JVD, murmurs, rubs, gallops or clicks. No pedal  edema. Gastrointestinal system: Abdomen is nondistended, soft and nontender. No organomegaly or masses felt. Normal bowel sounds heard. Central nervous system: Alert and oriented. No focal neurological deficits. Extremities: Symmetric 5 x 5 power. Skin: No rashes, lesions or ulcers Psychiatry: Judgement and insight appear normal. Mood & affect appropriate.

## 2024-01-03 NOTE — ED Notes (Signed)
 Hospitalist made aware of increased lactic acid level.

## 2024-01-03 NOTE — Progress Notes (Signed)
 [facility] [phone]   [phone]  PROGRESS NOTE  Patient Care Team: Tysinger, Kermit Balo, PA-C as PCP - General (Family Medicine) Jaci Standard, MD as Consulting Physician (Hematology and Oncology)  Hematological/Oncological History # Follicular Lymphoma. Stage III 1) 01/07/2020: CT Neck W contrast showed cervical lymphadenopathy bilaterally, left greater than right. Numerous enlarged lymph nodes are present 2)  01/22/2020: [place] Core biopsy performed which revealed overall features consistent with involvement by non-Hodgkin's B-cell lymphoma and the morphologic and phenotypic findings favor follicular lymphoma.  3) 01/31/2020: establish care with Dr. Leonides Schanz  4) 02/12/2020: PET CT scan demonstrates bulky adenopathy in the neck, chest, abdomen and pelvis and diffuse skeletal uptake is suspicious for (but not diagnostic) of marrow involvement. 5) 04/30/2020: Cycle 1 Day 1 of Bendamustine/Rituximab 6) 05/29/2020: Cycle 2 Day 1 of Bendamustine/Rituximab 7) 06/25/2020: Cycle 3 Day 1 of Bendamustine/Rituximab 8) 07/23/2020: Cycle 4 Day 1 of Bendamustine/Rituximab 9) 08/25/2020: Cycle 5 Day 1 of Bendamustine/Rituximab 10)09/17/2020: Cycle 6 Day 1 of Bendamustine/Rituximab 11) 10/26/2020: PET CT scan shows partial response (Deauville Score 3 in lymph nodes but some residual FDG avidity in the bone marrow).  12) 04/27/2021: CT scan showed interval decreased adenopathy below the diaphragm, consistent with treatment response. No new or enlarging suspicious lymph nodes 13) 10/27/2021: CT scan showed increased left inguinal adenopathy, including a 1.6 cm left inguinal lymph node which was previously 0.9 cm in short axis. 14) 04/30/2022: CT scan shows progressive adenopathy below the diaphragm with new thoracic adenopathy and increased hazy retroperitoneal stranding, consistent with worsening lymphomatous disease. 15) 05/05/2022: Doppler US of lower extremities revealed bilateral  lymph nodes in the groin.   16) 06/10/2022: Echo: EF 60-65% 17) 06/29/2022: Cycle 1 of R-CHOP 18) 07/26/2022: Cycle 2 of R-CHOP 19) 08/17/2022: Cycle 3 of R-CHOP 20) 09/07/2022: Cycle 4 of R-CHOP 21) 09/29/2022: Cycle 5 of R-CHOP 22) 10/27/2022: Cycle 6 of R-CHOP  # Well Differentiated Neuroendocrine Tumor 03/08/2023: Routine colonoscopy performed and patient was found to have a well-differentiated neuroendocrine tumor in the ileum.  Tumor was 0.5 cm with a Ki-67 of less than 1%  Interval History:  Jorge Gould 68 y.o. male with medical history significant for Stage III follicular lymphoma who presents for a follow up visit. The patient's last visit was on 12/14/2023. In the interim since the last visit he had a colonoscopy which showed no evidence of bleeding in the upper GI tract and a biopsy of the liver which confirmed metastatic colon cancer.  On exam today Jorge Gould reports reports that he is having continued severe abdominal pain.  He reports that it feels like a stretch and a pressure in the right side of his abdomen.  He has been taking over-the-counter medications and did take some ibuprofen, which we did warn him against due to his dark stools and concern for GI bleeding.  He denies any fevers, chills, sweats.  Overall he has no signs or symptoms concerning for progression of his lymphoma.  A full 10 point ROS was otherwise negative.   The bulk of our discussion focused on the results of his biopsy and the new diagnosis of a metastatic colon cancer.  We discussed the poor prognosis associated with this and the rapid growth which is atypical for this form cancer.  We also discussed treatment options and he was agreeable to proceeding with FOLFOX chemotherapy.  MEDICAL HISTORY:  Past Medical History:  Diagnosis Date   Cancer (HCC) 2021   follicular  lymphoma   Obesity     SURGICAL HISTORY: Past Surgical History:  Procedure Laterality Date   ANKLE FRACTURE SURGERY  2016   right,  trauma repair after fall down stairs   HERNIA REPAIR     umbilical   IR IMAGING GUIDED PORT INSERTION  04/09/2020   IR IMAGING GUIDED PORT INSERTION  06/17/2022   IR REMOVAL TUN ACCESS W/ PORT W/O FL MOD SED  11/27/2020   TONSILLECTOMY      SOCIAL HISTORY: Social History   Socioeconomic History   Marital status: Married    Spouse name: Not on file   Number of children: Not on file   Years of education: Not on file   Highest education level: Not on file  Occupational History   Not on file  Tobacco Use   Smoking status: Never   Smokeless tobacco: Never  Vaping Use   Vaping status: Never Used  Substance and Sexual Activity   Alcohol use: Yes    Alcohol/week: 3.0 standard drinks of alcohol    Types: 3 Shots of liquor per week   Drug use: Not Currently   Sexual activity: Not on file  Other Topics Concern   Not on file  Social History Narrative   Engaged, plan to marry in March 2021, been together since 2014.  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    [employer].   Most exercise on the job, working 7 days per week.   No children, but fiance has 5 children.    11/2019   Social Drivers of Corporate investment banker Strain: Not on file  Food Insecurity: Not on file  Transportation Needs: Not on file  Physical Activity: Not on file  Stress: Not on file  Social Connections: Not on file  Intimate Partner Violence: Not on file    FAMILY HISTORY: Family History  Problem Relation Age of Onset   Cancer Mother        lung; former smoker   COPD Father        emphysema   Diabetes Maternal Aunt    Heart disease Maternal Uncle    Heart disease Maternal Uncle    Diabetes Maternal Aunt    Stroke Neg Hx    Hyperlipidemia Neg Hx    Hypertension Neg Hx     ALLERGIES:  has no known allergies.  MEDICATIONS:  No current facility-administered medications for this visit.   No current outpatient medications on file.   Facility-Administered Medications Ordered in Other Visits   Medication Dose Route Frequency Provider Last Rate Last Admin   0.9 %  sodium chloride infusion   Intravenous Continuous Amin, Ankit C, MD 75 mL/hr at 01/03/24 0919 New Bag at 01/03/24 0919   acetaminophen (TYLENOL) tablet 650 mg  650 mg Oral Q6H PRN Amin, Ankit C, MD   650 mg at 01/03/24 1702   enoxaparin (LOVENOX) injection 40 mg  40 mg Subcutaneous Q24H Earlie Lou L, MD   40 mg at 01/03/24 0920   guaiFENesin (ROBITUSSIN) 100 MG/5ML liquid 5 mL  5 mL Oral Q4H PRN Amin, Ankit C, MD       hydrALAZINE (APRESOLINE) injection 10 mg  10 mg Intravenous Q4H PRN Amin, Ankit C, MD       ipratropium-albuterol (DUONEB) 0.5-2.5 (3) MG/3ML nebulizer solution 3 mL  3 mL Nebulization Q4H PRN Amin, Ankit C, MD       metoprolol tartrate (LOPRESSOR) injection 5 mg  5 mg Intravenous Q4H PRN Amin, Ankit C,  MD       ondansetron (ZOFRAN) tablet 4 mg  4 mg Oral Q6H PRN Rometta Emery, MD       Or   ondansetron (ZOFRAN) injection 4 mg  4 mg Intravenous Q6H PRN Rometta Emery, MD       oseltamivir (TAMIFLU) capsule 30 mg  30 mg Oral BID Amin, Ankit C, MD       oxyCODONE (Oxy IR/ROXICODONE) immediate release tablet 5 mg  5 mg Oral Q4H PRN Amin, Ankit C, MD       senna-docusate (Senokot-S) tablet 1 tablet  1 tablet Oral QHS PRN Amin, Ankit C, MD       traZODone (DESYREL) tablet 50 mg  50 mg Oral QHS PRN Amin, Ankit C, MD        REVIEW OF SYSTEMS:   Constitutional: ( - ) fevers, ( - )  chills , ( - ) night sweats Eyes: ( - ) blurriness of vision, ( - ) double vision, ( - ) watery eyes Ears, nose, mouth, throat, and face: ( - ) mucositis, ( - ) sore throat Respiratory: ( - ) cough, ( - ) dyspnea, ( - ) wheezes Cardiovascular: ( - ) palpitation, ( - ) chest discomfort, ( - ) lower extremity swelling Gastrointestinal:  ( - ) nausea, ( - ) heartburn, ( - ) change in bowel habits Skin: ( - ) abnormal skin rashes Lymphatics: ( - ) new lymphadenopathy, ( - ) easy bruising Neurological: ( - ) numbness, ( - )  tingling, ( - ) new weaknesses Behavioral/Psych: ( - ) mood change, ( - ) new changes  All other systems were reviewed with the patient and are negative.  PHYSICAL EXAMINATION: ECOG PERFORMANCE STATUS: 1 - Symptomatic but completely ambulatory  Vitals:   12/30/23 1330  BP: (!) 139/93  Pulse: 98  Resp: 17  Temp: (!) 97.5 F (36.4 C)  SpO2: 94%     Filed Weights   12/30/23 1330  Weight: 238 lb 8 oz (108.2 kg)      GENERAL: well appearing middle aged Caucasian male in NAD  SKIN: skin color, texture, turgor are normal, no rashes or significant lesions EYES: conjunctiva are pink and non-injected, sclera clear LUNGS: clear to auscultation and percussion with normal breathing effort HEART: regular rate & rhythm and no murmurs and +1 pitting edema in lower extremity bilaterally.  Musculoskeletal: no cyanosis of digits and no clubbing  PSYCH: alert & oriented x 3, fluent speech NEURO: no focal motor/sensory deficits  LABORATORY DATA:  I have reviewed the data as listed    Latest Ref Rng & Units 01/03/2024    4:24 AM 01/02/2024    9:06 PM 01/02/2024    9:00 PM  CBC  WBC 4.0 - 10.5 K/uL 11.6   15.0   Hemoglobin 13.0 - 17.0 g/dL 7.8  9.5  8.8   Hematocrit 39.0 - 52.0 % 24.8  28.0  28.0   Platelets 150 - 400 K/uL 419   584        Latest Ref Rng & Units 01/03/2024    4:24 AM 01/02/2024    9:06 PM 01/02/2024    9:00 PM  CMP  Glucose 70 - 99 mg/dL  161  096   BUN 8 - 23 mg/dL  47  49   Creatinine 0.45 - 1.24 mg/dL 4.09  8.11  9.14   Sodium 135 - 145 mmol/L  134  135   Potassium 3.5 - 5.1  mmol/L  5.0  5.0   Chloride 98 - 111 mmol/L  103  101   CO2 22 - 32 mmol/L   20   Calcium 8.9 - 10.3 mg/dL   8.8   Total Protein 6.5 - 8.1 g/dL   6.7   Total Bilirubin 0.0 - 1.2 mg/dL   1.1   Alkaline Phos 38 - 126 U/L   608   AST 15 - 41 U/L   139   ALT 0 - 44 U/L   88    RADIOGRAPHIC STUDIES: I have personally reviewed the radiological images as listed and agreed with the findings in  the report: Marked response in the patient's lymphadenopathy and decrease in his FDG avidity.  Bone marrow involvement also appears considerably less.  Overall consistent with a partial response.  DG Chest Port 1 View Result Date: 01/02/2024 CLINICAL DATA:  Sepsis.  Fever. EXAM: PORTABLE CHEST 1 VIEW COMPARISON:  12/17/2023 FINDINGS: The right IJ Port-A-Cath is stable The cardiac silhouette, mediastinal and hilar contours are within normal limits. The lungs are clear of an acute process. No infiltrates or effusions. No pulmonary lesions. The bony thorax is intact. IMPRESSION: No acute cardiopulmonary findings. Electronically Signed   By: Rudie Meyer M.D.   On: 01/02/2024 21:48   NM PET Image Initial (PI) Skull Base To Thigh (F-18 FDG) Result Date: 12/29/2023 CLINICAL DATA:  Subsequent treatment strategy for colorectal carcinoma. History of lymphoma. EXAM: NUCLEAR MEDICINE PET SKULL BASE TO THIGH TECHNIQUE: 11.9 mCi F-18 FDG was injected intravenously. Full-ring PET imaging was performed from the skull base to thigh after the radiotracer. CT data was obtained and used for attenuation correction and anatomic localization. Fasting blood glucose: 90 mg/dl COMPARISON:  CT 09/81/1914, DOTATATE PET-CT 04/21/2023 FINDINGS: NECK: focal metabolic lesion in the RIGHT parotid gland is likely a primary parotid neoplasm. Incidental CT findings: None. CHEST: No hypermetabolic mediastinal or hilar nodes. No suspicious pulmonary nodules on the CT scan. Incidental CT findings: Port in the anterior chest wall with tip in distal SVC. ABDOMEN/PELVIS: Innumerable round intensely hypermetabolic lesions throughout the LEFT and RIGHT hepatic lobes. These correspond to the hypoenhancing lesion on comparison CT. Lesions are intense. For example RIGHT hepatic lobe lesion with SUV max equal 14.2. LEFT hepatic lobe lesion with SUV max equal 14.4 There is hypermetabolic activity in the cecum on image 167 with SUV max equal 14.8. There is  a hypermetabolic lymph node adjacent to the cecum along the medial border measuring 14 mm on image 163 with SUV max equal 6.7. Smaller hypermetabolic lymph node in the ileocecal mesentery on image 157 measures 9 mm with SUV max equal 5.8. There is a cluster of hypermetabolic lymph nodes in the LEFT inguinal nodal station with SUV max equal 7.2 on image 203. Incidental CT findings: None. SKELETON: No focal hypermetabolic activity to suggest skeletal metastasis. Incidental CT findings: None. IMPRESSION: 1. Innumerable hypermetabolic liver lesions consistent with hepatic metastasis. 2. Hypermetabolic cecal mass concerning for primary colorectal carcinoma. 3. Hypermetabolic lymph nodes adjacent to the cecum and in the ileocecal mesentery consistent with nodal metastasis. 4. Hypermetabolic lymph nodes in the LEFT inguinal nodal station consistent with nodal metastasis. 5. No evidence of metastatic adenopathy in the chest. 6. 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 Electronically Signed   By: Genevive Bi M.D.   On: 12/29/2023 10:24   US BIOPSY (LIVER) Result Date: 12/22/2023 INDICATION: History of follicular lymphoma with development of multiple liver lesions.  EXAM: ULTRASOUND GUIDED CORE BIOPSY OF LIVER MASS MEDICATIONS: None. ANESTHESIA/SEDATION: Moderate (conscious) sedation was employed during this procedure. A total of Versed 1.0 mg and Fentanyl 50 mcg was administered intravenously. Moderate Sedation Time: 16 minutes. The patient's level of consciousness and vital signs were monitored continuously by radiology nursing throughout the procedure under my direct supervision. PROCEDURE: The procedure, risks, benefits, and alternatives were explained to the patient. Questions regarding the procedure were encouraged and answered. The patient understands and consents to the procedure. A time-out was performed prior to initiating the procedure. The abdominal wall was  prepped with chlorhexidine in a sterile fashion, and a sterile drape was applied covering the operative field. A sterile gown and sterile gloves were used for the procedure. Local anesthesia was provided with 1% Lidocaine. Ultrasound was performed to localize liver lesions. A lesion in the left lobe of the liver was chosen for biopsy. Under CT guidance, a 17 gauge trocar needle was advanced into the lesion. After confirming needle tip position, coaxial 18 gauge core biopsy samples were obtained. A total of 5 samples were submitted in both saline and formalin. The outer needle was removed and additional ultrasound performed. COMPLICATIONS: None immediate. FINDINGS: Rounded mass within the left lobe of the liver measures up to 5.7 cm in maximum diameter. Solid tissue was obtained. IMPRESSION: Ultrasound-guided core biopsy performed of a mass in the left lobe of the liver measuring up to 5.7 cm in maximum diameter. Electronically Signed   By: Irish Lack M.D.   On: 12/22/2023 09:40   CT ABDOMEN PELVIS W CONTRAST Result Date: 12/18/2023 CLINICAL DATA:  Abdominal pain, acute, nonlocalized. Shortness of breath. EXAM: CT ABDOMEN AND PELVIS WITH CONTRAST TECHNIQUE: Multidetector CT imaging of the abdomen and pelvis was performed using the standard protocol following bolus administration of intravenous contrast. RADIATION DOSE REDUCTION: This exam was performed according to the departmental dose-optimization program which includes automated exposure control, adjustment of the mA and/or kV according to patient size and/or use of iterative reconstruction technique. CONTRAST:  OMNIPAQUE IOHEXOL 350 MG/ML SOLN COMPARISON:  12/05/2023 FINDINGS: Lower chest: No acute abnormality Hepatobiliary: Numerous hepatic lesions are again noted similar prior study compatible with metastases. Gallbladder unremarkable. Pancreas: No focal abnormality or ductal dilatation. Spleen: No focal abnormality.  Normal size. Adrenals/Urinary  Tract: No adrenal abnormality. No focal renal abnormality. No stones or hydronephrosis. Urinary bladder is unremarkable. Stomach/Bowel: Stomach, large and small bowel grossly unremarkable. Normal appendix. Vascular/Lymphatic: Aortic atherosclerosis. No aneurysm. Multiple enlarged retroperitoneal, inguinal and central mesenteric lymph nodes, stable since prior study. Reproductive: No visible focal abnormality. Other: No free fluid or free air. Small left inguinal hernia containing fat. Musculoskeletal: No acute bony abnormality. IMPRESSION: Continued numerous large masses throughout the liver compatible with metastases, unchanged since recent study. Unchanged multi station lymphadenopathy. Aortic atherosclerosis. Electronically Signed   By: Charlett Nose M.D.   On: 12/18/2023 00:57   CT Angio Chest PE W/Cm &/Or Wo Cm Result Date: 12/18/2023 CLINICAL DATA:  Pulmonary embolism (PE) suspected, low to intermediate prob, positive D-dimer. Shortness of breath EXAM: CT ANGIOGRAPHY CHEST WITH CONTRAST TECHNIQUE: Multidetector CT imaging of the chest was performed using the standard protocol during bolus administration of intravenous contrast. Multiplanar CT image reconstructions and MIPs were obtained to evaluate the vascular anatomy. RADIATION DOSE REDUCTION: This exam was performed according to the departmental dose-optimization program which includes automated exposure control, adjustment of the mA and/or kV according to patient size and/or use of iterative reconstruction technique. CONTRAST:   OMNIPAQUE IOHEXOL 350 MG/ML SOLN COMPARISON:  12/05/2023 FINDINGS: Cardiovascular: No filling defects in the pulmonary arteries to suggest pulmonary emboli. Heart mildly enlarged. Scattered coronary artery and aortic atherosclerosis. Mediastinum/Nodes: No mediastinal, hilar, or axillary adenopathy. Trachea and esophagus are unremarkable. Thyroid unremarkable. Lungs/Pleura: Lungs are clear. No focal airspace opacities or  suspicious nodules. No effusions. Upper Abdomen: See abdominal CT report Musculoskeletal: Chest wall soft tissues are unremarkable. No acute bony abnormality. Review of the MIP images confirms the above findings. IMPRESSION: No evidence of pulmonary embolus. No acute cardiopulmonary disease. Coronary artery disease. Aortic Atherosclerosis (ICD10-I70.0). Electronically Signed   By: Charlett Nose M.D.   On: 12/18/2023 00:54   DG Chest Port 1 View Result Date: 12/17/2023 CLINICAL DATA:  Worsening shortness of breath. EXAM: PORTABLE CHEST 1 VIEW COMPARISON:  April 21, 2022 FINDINGS: A right-sided venous Port-A-Cath is in place, with its distal tip seen at the junction of the superior vena cava and right atrium. The heart size and mediastinal contours are within normal limits. Both lungs are clear. The visualized skeletal structures are unremarkable. IMPRESSION: No active disease. Electronically Signed   By: Aram Candela M.D.   On: 12/17/2023 22:22   CT CHEST ABDOMEN PELVIS W CONTRAST Result Date: 12/05/2023 CLINICAL DATA:  Hematologic malignancy, assess treatment response EXAM: CT CHEST, ABDOMEN, AND PELVIS WITH CONTRAST TECHNIQUE: Multidetector CT imaging of the chest, abdomen and pelvis was performed following the standard protocol during bolus administration of intravenous contrast. RADIATION DOSE REDUCTION: This exam was performed according to the departmental dose-optimization program which includes automated exposure control, adjustment of the mA and/or kV according to patient size and/or use of iterative reconstruction technique. CONTRAST:  OMNIPAQUE IOHEXOL 300 MG/ML  SOLN COMPARISON:  CT chest abdomen and pelvis 06/10/2023. FINDINGS: CT CHEST FINDINGS Cardiovascular: Right chest wall infusion port with the distal tip at the superior cavoatrial junction. No acute vascular abnormality. Normal heart size. No pericardial effusion. Mediastinum/Nodes: No enlarged mediastinal, hilar, or axillary lymph  nodes. Thyroid gland, trachea, and esophagus demonstrate no significant findings. Lungs/Pleura: Lungs are clear. No pleural effusion or pneumothorax. Musculoskeletal: No chest wall mass or suspicious bone lesions identified. CT ABDOMEN PELVIS FINDINGS Hepatobiliary: Numerous new hypoattenuating hepatic lesions, the largest measuring up to 10 cm in the left lobe (2:58). A 7 mm filling defect in the proximal right portal vein (2:62) may represent thrombus or tumor invasion. Normal gallbladder. No biliary ductal dilation. Pancreas: Unremarkable. No pancreatic ductal dilatation or surrounding inflammatory changes. Spleen: Normal in size without focal abnormality. Adrenals/Urinary Tract: Adrenal glands are unremarkable. Kidneys are normal, without renal calculi, focal lesion, or hydronephrosis. Bladder is unremarkable. Stomach/Bowel: Stomach is within normal limits. Appendix appears normal. No evidence of bowel wall thickening, distention, or inflammatory changes. Vascular/Lymphatic: Aortic atherosclerosis. Multiple enlarged lymph nodes in the retroperitoneal, periaortic, and inguinal regions. A left periaortic node measuring 3.2 x 2.3 cm (2:76) as well as a dominant 3 cm left inguinal node (2:130), appear similar from prior. Reproductive: Prostatomegaly. Other: No abdominal wall hernia or abnormality. No abdominopelvic ascites. Musculoskeletal: No acute or significant osseous findings. IMPRESSION: 1. Multiple new hepatic lesions highly suggestive for metastatic disease. 2. Subcentimeter filling defect in the proximal right portal vein may represent artifact, nonocclusive thrombus, or tumor invasion. 3. Overall stable multi station lymphadenopathy as described. A communications note was sent to the ordering provider's office on 12/05/2023 at 15:00 Electronically Signed   By: Levi Aland M.D.   On: 12/05/2023 15:09    ASSESSMENT & PLAN  Riley Churches 68 y.o. male with medical history significant for diagnosed  follicular lymphoma who presents for a follow up visit.   He completed 6 cycles of Bendamustine/Rituximab therapy from 04/30/2020-09/17/2020. Repeat PET CT scan on 10/26/2020 showed a partial response. OK to enter observation.   CT scan from 04/30/2022 and doppler US from 05/05/2022 showed enlarging adenopathy. Recommended second line therapy with R-CHOP. Regimen consists of Rituximab 375 mg/m2, cytoxan 750 mg/m2, vincristine 2 mg IV, adriamycin 50 mg/m2 along with prednisone 60 mg PO on days 1-5. This regimen is given every 21 days.   # Follicular Lymphoma. Stage III --patient completed Cycle 6 of R-Benda therapy. This was administered with curative intent.  --PET CT scan after 6 cycles of R-Benda showed substantial partial response. Residual mild hypermetabolism within the retroperitoneal and left common iliac chains (Deauville score 3). No residual metabolically active adenopathy in the neck or chest --per [place] Criteria, patient is a Partial Response.  --CT neck and CAP from 04/30/2022 showed progressive adenopathy.  --Doppler  US from 05/05/2022 showed progressive inguinal adenopathy, bilateral. --Recommend systemic therapy with R-CHOP started on 06/29/2022 ----Baseline ECHO from 06/10/2022 shows EF 60-65% Plan:  --patient completed 6 cycles of R-CHOP --Labs from today were reviewed and show white blood cell 11.6, hemoglobin 7.8, MCV 79, platelets 419 -- LFTs show AST 120, ALT 63, Alk phos 639  -- Findings appear to show stable lymphadenopathy but increased metastatic disease to the liver.  Found to be metastatic colon cancer, noted below.  # Metastatic Colon Cancer  -- Prior CT findings showed concern for metastatic disease to the liver.  Biopsy showed metastatic colon cancer. -- PET CT scan ordered and did confirm large cecal mass, likely the primary colon cancer. -- Discussed with gastroenterology.  Will hold off on colonoscopy at this time as we have tissue for NGS testing.  In the event we  do not have adequate tissue for testing would recommend colonoscopy. -- Patient already has port in place.  Will plan to start FOLFOX plus bevacizumab as first-line chemotherapy for his newly diagnosed metastatic colon cancer. -- Mr. Malina and his wife voiced understanding of our findings as well as the poor prognosis associated with metastatic colon cancer. -- Return to clinic for the start of FOLFOX chemotherapy.  #Anemia #Dark Stools -- Most likely to be connected to his apparent progression of disease, though GI bleed cannot be ruled out given his dark stools. -- Upper endoscopy did not show any evidence of GI bleed.  Suspect bleeding is coming from the patient's cecal mass.  # Well Differentiated Neuroendocrine Tumor -- Continue to monitor in conjunction with his follicular lymphoma. -- No elevations in chromogranin or evidence of more disease on PET dotatate  # Left Lower Leg Swelling, improved --Patient has no more redness and +1 pitting edema of left lower extremity. Swelling L>R --Doppler US from 05/05/2022 showed no evidence of DVT. There are enlarged lymph nodes noted in the groin, bilaterally which is likely causing the swelling.   #Supportive Care -- port placed -- zofran 8mg  q8H PRN and compazine 10mg  PO q6H for nausea -- EMLA cream for port -- no pain medication required at this time.   Orders Placed This Encounter  Procedures   CBC with Differential (Cancer Center Only)    Standing Status:   Future    Expected Date:   01/05/2024    Expiration Date:   01/04/2025   CMP (Cancer Center only)    Standing Status:  Future    Expected Date:   01/05/2024    Expiration Date:   01/04/2025   Total Protein, Urine dipstick    Standing Status:   Future    Expected Date:   01/05/2024    Expiration Date:   01/04/2025   CBC with Differential (Cancer Center Only)    Standing Status:   Future    Expected Date:   01/19/2024    Expiration Date:   01/18/2025   CMP (Cancer Center only)     Standing Status:   Future    Expected Date:   01/19/2024    Expiration Date:   01/18/2025   Informed Consent Details: Physician/Practitioner Attestation; Transcribe to consent form and obtain patient signature    Standing Status:   Standing    Number of Occurrences:   1    Physician/Practitioner attestation of informed consent for blood and or blood product transfusion:   I, the physician/practitioner, attest that I have discussed with the patient the benefits, risks, side effects, alternatives, likelihood of achieving goals and potential problems during recovery for the procedure that I have provided informed consent.    Product(s):   All Product(s)    All questions were answered. The patient knows to call the clinic with any problems, questions or concerns.  I have spent a total of 30 minutes minutes of face-to-face and non-face-to-face time, preparing to see the patient,  performing a medically appropriate examination, counseling and educating the patient, ordering medications, documenting clinical information in the electronic health record, and care coordination.   Ulysees Barns, MD Department of Hematology/Oncology [facility] at [facility] Phone: [phone] Pager: [phone] Email: [email or web address]  01/03/2024 5:20 PM

## 2024-01-04 DIAGNOSIS — J09X1 Influenza due to identified novel influenza A virus with pneumonia: Secondary | ICD-10-CM | POA: Diagnosis not present

## 2024-01-04 LAB — CBC
HCT: 21.8 % — ABNORMAL LOW (ref 39.0–52.0)
Hemoglobin: 6.8 g/dL — CL (ref 13.0–17.0)
MCH: 24.6 pg — ABNORMAL LOW (ref 26.0–34.0)
MCHC: 31.2 g/dL (ref 30.0–36.0)
MCV: 79 fL — ABNORMAL LOW (ref 80.0–100.0)
Platelets: 305 10*3/uL (ref 150–400)
RBC: 2.76 MIL/uL — ABNORMAL LOW (ref 4.22–5.81)
RDW: 16.8 % — ABNORMAL HIGH (ref 11.5–15.5)
WBC: 8.7 10*3/uL (ref 4.0–10.5)
nRBC: 0 % (ref 0.0–0.2)

## 2024-01-04 LAB — BASIC METABOLIC PANEL
Anion gap: 9 (ref 5–15)
BUN: 39 mg/dL — ABNORMAL HIGH (ref 8–23)
CO2: 23 mmol/L (ref 22–32)
Calcium: 8.2 mg/dL — ABNORMAL LOW (ref 8.9–10.3)
Chloride: 103 mmol/L (ref 98–111)
Creatinine, Ser: 1.62 mg/dL — ABNORMAL HIGH (ref 0.61–1.24)
GFR, Estimated: 46 mL/min — ABNORMAL LOW (ref >60)
Glucose, Bld: 115 mg/dL — ABNORMAL HIGH (ref 70–99)
Potassium: 4.5 mmol/L (ref 3.5–5.1)
Sodium: 135 mmol/L (ref 135–145)

## 2024-01-04 LAB — PREPARE RBC (CROSSMATCH)

## 2024-01-04 LAB — TRANSFUSION REACTION
DAT C3: NEGATIVE
Post RXN DAT IgG: NEGATIVE

## 2024-01-04 LAB — MAGNESIUM: Magnesium: 2 mg/dL (ref 1.7–2.4)

## 2024-01-04 MED ORDER — CHLORHEXIDINE GLUCONATE CLOTH 2 % EX PADS
6.0000 | MEDICATED_PAD | Freq: Every day | CUTANEOUS | Status: DC
  Administered 2024-01-04: 6 via TOPICAL

## 2024-01-04 MED ORDER — SODIUM CHLORIDE 0.9% IV SOLUTION: Freq: Once | INTRAVENOUS | Status: AC

## 2024-01-04 NOTE — Progress Notes (Signed)
 Date and time results received: 01/04/24 0504 (use smartphrase ".now" to insert current time)  Test: lab Critical Value: Hgb 6.8  Name of Provider Notified: Webb Silversmith, NP  Orders Received? Or Actions Taken?: Orders Received - See Orders for details

## 2024-01-04 NOTE — Progress Notes (Signed)
   01/04/24 1133  TOC Brief Assessment  Insurance and Status Reviewed  Patient has primary care physician Yes  Home environment has been reviewed home with spouse  Prior level of function: independent  Prior/Current Home Services No current home services  Social Drivers of Health Review SDOH reviewed no interventions necessary  Readmission risk has been reviewed Yes  Transition of care needs no transition of care needs at this time

## 2024-01-04 NOTE — Progress Notes (Signed)
 Informed patient Hgb 6.8 and the NP ordered 1 unit of PRBC. Patient and wife stated that he was unable to receive the entire unit of blood during a previous blood transfusion due to the rapid increase in temperature. They cannot remember everything that happened but stated they stopped the blood and he only received half of the bag of blood.

## 2024-01-04 NOTE — Plan of Care (Signed)
  Problem: Fluid Volume: Goal: Hemodynamic stability will improve Outcome: Progressing   Problem: Clinical Measurements: Goal: Diagnostic test results will improve Outcome: Progressing   Problem: Respiratory: Goal: Ability to maintain adequate ventilation will improve Outcome: Progressing   Problem: Education: Goal: Knowledge of General Education information will improve Description: Including pain rating scale, medication(s)/side effects and non-pharmacologic comfort measures Outcome: Progressing   Problem: Nutrition: Goal: Adequate nutrition will be maintained Outcome: Progressing   Problem: Coping: Goal: Level of anxiety will decrease Outcome: Progressing   Problem: Elimination: Goal: Will not experience complications related to bowel motility Outcome: Progressing Goal: Will not experience complications related to urinary retention Outcome: Progressing   Problem: Pain Managment: Goal: General experience of comfort will improve and/or be controlled Outcome: Progressing   Problem: Safety: Goal: Ability to remain free from injury will improve Outcome: Progressing   Problem: Skin Integrity: Goal: Risk for impaired skin integrity will decrease Outcome: Progressing

## 2024-01-04 NOTE — Plan of Care (Signed)

## 2024-01-04 NOTE — Progress Notes (Signed)
 PROGRESS NOTE    Jorge Gould  ZOX:[id number] DOB: 09/10/1956 DOA: 01/02/2024 PCP: Jac Canavan, PA-C    Brief Narrative:  68 year old with history of follicular lymphoma, recurrent previously treated with chemotherapy, essential hypertension, morbid obesity came to the hospital with fevers and chills.   Assessment & Plan:  Principal Problem:   Sepsis (HCC) Active Problems:   Dyslipidemia   Follicular lymphoma of intra-abdominal lymph nodes (HCC)   Follicular lymphoma grade IIIa of intra-abdominal lymph nodes (HCC)   Colon cancer metastasized to liver (HCC)   Influenza A with pneumonia   AKI (acute kidney injury) (HCC)   Sepsis secondary to influenza infection - Respiratory panel is positive for influenza A infection.  Blood cultures are negative.  Patient has been started on supportive therapy including bronchodilators and Tamiflu. - Discontinue vancomycin and cefepime - I-S/flutter valve  Recurrent follicular lymphoma Anemia of chronic disease - Follow outpatient hematology oncology on active chemotherapy. -Admission hemoglobin 9.5, slowly trending down 6.8.  Appears to be delusional in nature.  No evidence of bleeding - Transfusion ordered. Dr Leonides Schanz aware  Acute kidney injury Lactic acidosis -Baseline creatinine 1.0, admission creatinine 2.3 which peaked at 2.6 > 1.62.  Continue IV fluids.  Discontinue Avapro - Repeat lactate trending down  Essential hypertension - IV as needed  Hyperlipidemia - Does not appear to be on home medications    DVT prophylaxis: enoxaparin (LOVENOX) injection 40 mg Start: 01/03/24 1000    Code Status: Full Code Family Communication:   Status is: Inpatient Remains inpatient appropriate because: Continue hospital stay for management of influenza and AKI   Subjective: Feeling better. No signs of any bleeding.   Examination:  General exam: Appears calm and comfortable  Respiratory system: Clear to auscultation. Respiratory  effort normal. Cardiovascular system: S1 & S2 heard, RRR. No JVD, murmurs, rubs, gallops or clicks. No pedal edema. Gastrointestinal system: Abdomen is nondistended, soft and nontender. No organomegaly or masses felt. Normal bowel sounds heard. Central nervous system: Alert and oriented. No focal neurological deficits. Extremities: Symmetric 5 x 5 power. Skin: No rashes, lesions or ulcers Psychiatry: Judgement and insight appear normal. Mood & affect appropriate.                Diet Orders (From admission, onward)     Start     Ordered   01/03/24 0410  Diet Heart Room service appropriate? Yes; Fluid consistency: Thin  Diet effective now       Question Answer Comment  Room service appropriate? Yes   Fluid consistency: Thin      01/03/24 0409            Objective: Vitals:   01/04/24 1040 01/04/24 1048 01/04/24 1103 01/04/24 1105  BP: 124/79 124/79 120/84 120/84  Pulse: 83 83 83 83  Resp: 18 18 18 18   Temp: 99.9 F (37.7 C) 99.9 F (37.7 C) 98.5 F (36.9 C) 98.5 F (36.9 C)  TempSrc: Oral Oral Oral Oral  SpO2: 97%  99% 99%  Weight:      Height:        Intake/Output Summary (Last 24 hours) at 01/04/2024 1300 Last data filed at 01/04/2024 0555 Gross per 24 hour  Intake 1134.1 ml  Output 1150 ml  Net -15.9 ml   Filed Weights   01/03/24 1744  Weight: 109 kg    Scheduled Meds:  Chlorhexidine Gluconate Cloth  6 each Topical Daily   enoxaparin (LOVENOX) injection  40 mg Subcutaneous Q24H  oseltamivir  30 mg Oral BID   sodium chloride flush  10-40 mL Intracatheter Q12H   Continuous Infusions:  Nutritional status     Body mass index is 35.49 kg/m.  Data Reviewed:   CBC: Recent Labs  Lab 12/30/23 1254 01/02/24 2100 01/02/24 2106 01/03/24 0424 01/03/24 1757 01/04/24 0332  WBC 11.7* 15.0*  --  11.6* 9.6 8.7  NEUTROABS 9.9* 12.9*  --   --   --   --   HGB 8.5* 8.8* 9.5* 7.8* 7.3* 6.8*  HCT 26.9* 28.0* 28.0* 24.8* 23.3* 21.8*  MCV 79.4* 78.7*   --  79.0* 79.5* 79.0*  PLT 507* 584*  --  419* 345 305   Basic Metabolic Panel: Recent Labs  Lab 12/30/23 1254 01/02/24 2100 01/02/24 2106 01/03/24 0424 01/03/24 1757 01/04/24 0332  NA 132* 135 134*  --  135 135  K 4.2 5.0 5.0  --  4.6 4.5  CL 98 101 103  --  104 103  CO2 26 20*  --   --  20* 23  GLUCOSE 137* 113* 108*  --  126* 115*  BUN 22 49* 47*  --  43* 39*  CREATININE 1.04 2.33* 2.60* 2.31* 1.83* 1.62*  CALCIUM 8.9 8.8*  --   --  8.4* 8.2*  MG  --   --   --   --   --  2.0   GFR: Estimated Creatinine Clearance: 52.3 mL/min (A) (by C-G formula based on SCr of 1.62 mg/dL (H)). Liver Function Tests: Recent Labs  Lab 12/30/23 1254 01/02/24 2100 01/03/24 1757  AST 120* 139* 114*  ALT 63* 88* 68*  ALKPHOS 639* 608* 491*  BILITOT 1.2 1.1 1.7*  PROT 6.2* 6.7 5.5*  ALBUMIN 3.3* 2.9* 2.4*   Recent Labs  Lab 01/02/24 2100  LIPASE 31   No results for input(s): "AMMONIA" in the last 168 hours. Coagulation Profile: Recent Labs  Lab 01/02/24 2100 01/03/24 0424  INR 1.3* 1.5*   Cardiac Enzymes: No results for input(s): "CKTOTAL", "CKMB", "CKMBINDEX", "TROPONINI" in the last 168 hours. BNP (last 3 results) No results for input(s): "PROBNP" in the last 8760 hours. HbA1C: No results for input(s): "HGBA1C" in the last 72 hours. CBG: No results for input(s): "GLUCAP" in the last 168 hours. Lipid Profile: No results for input(s): "CHOL", "HDL", "LDLCALC", "TRIG", "CHOLHDL", "LDLDIRECT" in the last 72 hours. Thyroid Function Tests: No results for input(s): "TSH", "T4TOTAL", "FREET4", "T3FREE", "THYROIDAB" in the last 72 hours. Anemia Panel: No results for input(s): "VITAMINB12", "FOLATE", "FERRITIN", "TIBC", "IRON", "RETICCTPCT" in the last 72 hours. Sepsis Labs: Recent Labs  Lab 01/02/24 2112 01/03/24 0003 01/03/24 1108 01/03/24 1757  LATICACIDVEN 3.5* 8.3* 3.2* 2.5*    Recent Results (from the past 240 hours)  Resp panel by RT-PCR (RSV, Flu A&B, Covid)  Anterior Nasal Swab     Status: Abnormal   Collection Time: 01/02/24  8:19 PM   Specimen: Anterior Nasal Swab  Result Value Ref Range Status   SARS Coronavirus 2 by RT PCR NEGATIVE NEGATIVE Final    Comment: (NOTE) SARS-CoV-2 target nucleic acids are NOT DETECTED.  The SARS-CoV-2 RNA is generally detectable in upper respiratory specimens during the acute phase of infection. The lowest concentration of SARS-CoV-2 viral copies this assay can detect is 138 copies/mL. A negative result does not preclude SARS-Cov-2 infection and should not be used as the sole basis for treatment or other patient management decisions. A negative result may occur with  improper specimen collection/handling, submission  of specimen other than nasopharyngeal swab, presence of viral mutation(s) within the areas targeted by this assay, and inadequate number of viral copies(<138 copies/mL). A negative result must be combined with clinical observations, patient history, and epidemiological information. The expected result is Negative.  Fact Sheet for Patients:  BloggerCourse.com  Fact Sheet for Healthcare Providers:  SeriousBroker.it  This test is no t yet approved or cleared by the [place] FDA and  has been authorized for detection and/or diagnosis of SARS-CoV-2 by FDA under an Emergency Use Authorization (EUA). This EUA will remain  in effect (meaning this test can be used) for the duration of the COVID-19 declaration under Section 564(b)(1) of the Act, 21 U.S.C.section 360bbb-3(b)(1), unless the authorization is terminated  or revoked sooner.       Influenza A by PCR POSITIVE (A) NEGATIVE Final   Influenza B by PCR NEGATIVE NEGATIVE Final    Comment: (NOTE) The Xpert Xpress SARS-CoV-2/FLU/RSV plus assay is intended as an aid in the diagnosis of influenza from Nasopharyngeal swab specimens and should not be used as a sole basis for treatment. Nasal  washings and aspirates are unacceptable for Xpert Xpress SARS-CoV-2/FLU/RSV testing.  Fact Sheet for Patients: BloggerCourse.com  Fact Sheet for Healthcare Providers: SeriousBroker.it  This test is not yet approved or cleared by the [place] FDA and has been authorized for detection and/or diagnosis of SARS-CoV-2 by FDA under an Emergency Use Authorization (EUA). This EUA will remain in effect (meaning this test can be used) for the duration of the COVID-19 declaration under Section 564(b)(1) of the Act, 21 U.S.C. section 360bbb-3(b)(1), unless the authorization is terminated or revoked.     Resp Syncytial Virus by PCR NEGATIVE NEGATIVE Final    Comment: (NOTE) Fact Sheet for Patients: BloggerCourse.com  Fact Sheet for Healthcare Providers: SeriousBroker.it  This test is not yet approved or cleared by the [place] FDA and has been authorized for detection and/or diagnosis of SARS-CoV-2 by FDA under an Emergency Use Authorization (EUA). This EUA will remain in effect (meaning this test can be used) for the duration of the COVID-19 declaration under Section 564(b)(1) of the Act, 21 U.S.C. section 360bbb-3(b)(1), unless the authorization is terminated or revoked.  Performed at [facility], [address] [place], [place] [address]   Blood Culture (routine x 2)     Status: None (Preliminary result)   Collection Time: 01/02/24  9:00 PM   Specimen: BLOOD  Result Value Ref Range Status   Specimen Description   Final    BLOOD RIGHT ANTECUBITAL Performed at [facility], [address] [place], [place] [address]    Special Requests   Final    BOTTLES DRAWN AEROBIC AND ANAEROBIC Blood Culture adequate volume Performed at [facility], [address], [place], [place] [address]    Culture   Final    NO GROWTH 2  DAYS Performed at [facility], [address], [place], [place] [address]    Report Status PENDING  Incomplete  Blood Culture (routine x 2)     Status: None (Preliminary result)   Collection Time: 01/02/24  9:01 PM   Specimen: BLOOD  Result Value Ref Range Status   Specimen Description   Final    BLOOD BLOOD LEFT HAND Performed at [facility], [address], [place], [place] [address]    Special Requests   Final    BOTTLES DRAWN AEROBIC ONLY Blood Culture results may not  be optimal due to an inadequate volume of blood received in culture bottles Performed at [facility], [address], [place], [place] [address]    Culture   Final    NO GROWTH 2 DAYS Performed at [facility], [address], [place], [place] [address]    Report Status PENDING  Incomplete         Radiology Studies: DG Chest Port 1 View Result Date: 01/02/2024 CLINICAL DATA:  Sepsis.  Fever. EXAM: PORTABLE CHEST 1 VIEW COMPARISON:  12/17/2023 FINDINGS: The right IJ Port-A-Cath is stable The cardiac silhouette, mediastinal and hilar contours are within normal limits. The lungs are clear of an acute process. No infiltrates or effusions. No pulmonary lesions. The bony thorax is intact. IMPRESSION: No acute cardiopulmonary findings. Electronically Signed   By: Rudie Meyer M.D.   On: 01/02/2024 21:48           LOS: 2 days   Time spent= 35 mins    Miguel Rota, MD [facility]  If 7PM-7AM, please contact night-coverage  01/04/2024, 1:00 PM

## 2024-01-05 ENCOUNTER — Inpatient Hospital Stay: Payer: Medicare Other

## 2024-01-05 ENCOUNTER — Other Ambulatory Visit (HOSPITAL_COMMUNITY): Payer: Self-pay

## 2024-01-05 ENCOUNTER — Encounter: Payer: Self-pay | Admitting: Hematology and Oncology

## 2024-01-05 ENCOUNTER — Telehealth: Payer: Self-pay | Admitting: Hematology and Oncology

## 2024-01-05 ENCOUNTER — Other Ambulatory Visit: Payer: Self-pay | Admitting: *Deleted

## 2024-01-05 DIAGNOSIS — C189 Malignant neoplasm of colon, unspecified: Secondary | ICD-10-CM

## 2024-01-05 DIAGNOSIS — J09X1 Influenza due to identified novel influenza A virus with pneumonia: Secondary | ICD-10-CM | POA: Diagnosis not present

## 2024-01-05 LAB — TYPE AND SCREEN
ABO/RH(D): O POS
Antibody Screen: NEGATIVE
Unit division: 0

## 2024-01-05 LAB — CBC
HCT: 25.4 % — ABNORMAL LOW (ref 39.0–52.0)
Hemoglobin: 8 g/dL — ABNORMAL LOW (ref 13.0–17.0)
MCH: 25.6 pg — ABNORMAL LOW (ref 26.0–34.0)
MCHC: 31.5 g/dL (ref 30.0–36.0)
MCV: 81.2 fL (ref 80.0–100.0)
Platelets: 307 10*3/uL (ref 150–400)
RBC: 3.13 MIL/uL — ABNORMAL LOW (ref 4.22–5.81)
RDW: 16.9 % — ABNORMAL HIGH (ref 11.5–15.5)
WBC: 8.9 10*3/uL (ref 4.0–10.5)
nRBC: 0 % (ref 0.0–0.2)

## 2024-01-05 LAB — BPAM RBC
Blood Product Expiration Date: 202503272359
ISSUE DATE / TIME: 202502261035
Unit Type and Rh: 5100

## 2024-01-05 LAB — BASIC METABOLIC PANEL
Anion gap: 10 (ref 5–15)
BUN: 35 mg/dL — ABNORMAL HIGH (ref 8–23)
CO2: 21 mmol/L — ABNORMAL LOW (ref 22–32)
Calcium: 8.3 mg/dL — ABNORMAL LOW (ref 8.9–10.3)
Chloride: 102 mmol/L (ref 98–111)
Creatinine, Ser: 1.47 mg/dL — ABNORMAL HIGH (ref 0.61–1.24)
GFR, Estimated: 51 mL/min — ABNORMAL LOW (ref >60)
Glucose, Bld: 101 mg/dL — ABNORMAL HIGH (ref 70–99)
Potassium: 4.4 mmol/L (ref 3.5–5.1)
Sodium: 133 mmol/L — ABNORMAL LOW (ref 135–145)

## 2024-01-05 LAB — MAGNESIUM: Magnesium: 2 mg/dL (ref 1.7–2.4)

## 2024-01-05 MED ORDER — HEPARIN SOD (PORK) LOCK FLUSH 100 UNIT/ML IV SOLN
500.0000 [IU] | INTRAVENOUS | Status: AC | PRN
  Administered 2024-01-05: 500 [IU]
  Filled 2024-01-05: qty 5

## 2024-01-05 MED ORDER — OSELTAMIVIR PHOSPHATE 30 MG PO CAPS
30.0000 mg | ORAL_CAPSULE | Freq: Two times a day (BID) | ORAL | 0 refills | Status: AC
  Filled 2024-01-05: qty 6, 3d supply, fill #0

## 2024-01-05 NOTE — Discharge Summary (Signed)
 Physician Discharge Summary  Jorge Gould ZOX:[id number] DOB: Aug 14, 1956 DOA: 01/02/2024  PCP: Jac Canavan, PA-C  Admit date: 01/02/2024 Discharge date: 01/05/2024  Admitted From: Home Disposition: Home  Recommendations for Outpatient Follow-up:  Follow up with PCP in 1-2 weeks Please obtain BMP/CBC in one week your next doctors visit.  P.o. Tamiflu for 3 more days Supportive care   Discharge Condition: Stable CODE STATUS: Full code Diet recommendation: Regular  Brief/Interim Summary: Brief Narrative:  68 year old with history of follicular lymphoma, recurrent previously treated with chemotherapy, essential hypertension, morbid obesity came to the hospital with fevers and chills.  During hospitalization he was diagnosed with influenza and started on Tamiflu.  Also while being on active chemotherapy he had drop in hemoglobin of 6.8 without evidence of active bleeding.  1 unit PRBC transfusion was given.  Hemoglobin stable. Today would like to go home.  Medically stable for discharge.  Spouse updated by me.  Assessment & Plan:  Principal Problem:   Sepsis (HCC) Active Problems:   Dyslipidemia   Follicular lymphoma of intra-abdominal lymph nodes (HCC)   Follicular lymphoma grade IIIa of intra-abdominal lymph nodes (HCC)   Colon cancer metastasized to liver (HCC)   Influenza A with pneumonia   AKI (acute kidney injury) (HCC)   Sepsis secondary to influenza infection - Respiratory panel is positive for influenza A infection.  Blood cultures are negative.  Patient has been started on supportive therapy including bronchodilators and Tamiflu. Fever during transfusion. Subsided for now.  - Discontinue vancomycin and cefepime - I-S/flutter valve  Recurrent follicular lymphoma Anemia of chronic disease - Follow outpatient hematology oncology on active chemotherapy. -Admission hemoglobin 9.5, slowly trending down 6.8.  Appears to be delusional in nature.  No evidence of bleeding.   Hemoglobin has improved, 8.0 - Dr. Leonides Schanz aware of transfusion  Acute kidney injury Lactic acidosis -Baseline creatinine 1.0, admission creatinine 2.3 which peaked at 2.6 > 1.62 >1.47.  Continue IV fluids.  Discontinue Avapro - Repeat lactate trending down  Essential hypertension - IV as needed  Hyperlipidemia - Does not appear to be on home medications    DVT prophylaxis: enoxaparin (LOVENOX) injection 40 mg Start: 01/03/24 1000    Code Status: Full Code Family Communication: Spouse updated Status is: Inpatient Remains inpatient appropriate because: Continue hospital stay for management of influenza and AKI   Subjective: Fever overnight during transfusion.  Ok now, he wishes to go home Ambulating without any evidence of hypoxia  Examination:  General exam: Appears calm and comfortable  Respiratory system: Clear to auscultation. Respiratory effort normal. Cardiovascular system: S1 & S2 heard, RRR. No JVD, murmurs, rubs, gallops or clicks. No pedal edema. Gastrointestinal system: Abdomen is nondistended, soft and nontender. No organomegaly or masses felt. Normal bowel sounds heard. Central nervous system: Alert and oriented. No focal neurological deficits. Extremities: Symmetric 5 x 5 power. Skin: No rashes, lesions or ulcers Psychiatry: Judgement and insight appear normal. Mood & affect appropriate.    Discharge Diagnoses:  Principal Problem:   Sepsis (HCC) Active Problems:   Dyslipidemia   Follicular lymphoma of intra-abdominal lymph nodes (HCC)   Follicular lymphoma grade IIIa of intra-abdominal lymph nodes (HCC)   Colon cancer metastasized to liver [facility])   Influenza A with pneumonia   AKI (acute kidney injury) (HCC)      Discharge Exam: Vitals:   01/05/24 0650 01/05/24 1007  BP:  116/78  Pulse:  84  Resp:  18  Temp: 98.2 F (36.8 C) 97.9 F (36.6  C)  SpO2:  100%   Vitals:   01/05/24 0107 01/05/24 0553 01/05/24 0650 01/05/24 1007  BP: 103/61  118/78  116/78  Pulse: 85 85  84  Resp: 20 20  18   Temp: 98.4 F (36.9 C) (!) 100.7 F (38.2 C) 98.2 F (36.8 C) 97.9 F (36.6 C)  TempSrc: Oral Oral Oral Oral  SpO2: 97% 96%  100%  Weight:      Height:          Discharge Instructions   Allergies as of 01/05/2024   No Known Allergies      Medication List     STOP taking these medications    amoxicillin-clavulanate 875-125 MG tablet Commonly known as: AUGMENTIN       TAKE these medications    acetaminophen 500 MG tablet Commonly known as: TYLENOL Take 2 tablets (1,000 mg total) by mouth every 6 (six) hours as needed.   benzonatate 200 MG capsule Commonly known as: TESSALON Take 1 capsule (200 mg total) by mouth 3 (three) times daily as needed for cough.   omeprazole 40 MG capsule Commonly known as: PRILOSEC Take 40 mg by mouth daily.   oseltamivir 30 MG capsule Commonly known as: TAMIFLU Take 1 capsule (30 mg total) by mouth 2 (two) times daily for 3 days.   oxyCODONE 5 MG immediate release tablet Commonly known as: Oxy IR/ROXICODONE Take 1 tablet (5 mg total) by mouth every 4 (four) hours as needed for severe pain (pain score 7-10).   predniSONE 20 MG tablet Commonly known as: DELTASONE 3 tablets today, 2 tablets tomorrow, 1 tablet the third day   valsartan 40 MG tablet Commonly known as: Diovan Take 1 tablet (40 mg total) by mouth daily.        Follow-up Information     Schedule an appointment as soon as possible for a visit  with Connect with your PCP/Specialist as discussed.   Contact information: [email or web address] Call our physician referral line at [phone].               No Known Allergies  You were cared for by a hospitalist during your hospital stay. If you have any questions about your discharge medications or the care you received while you were in the hospital after you are discharged, you can call the unit and asked to speak with the  hospitalist on call if the hospitalist that took care of you is not available. Once you are discharged, your primary care physician will handle any further medical issues. Please note that no refills for any discharge medications will be authorized once you are discharged, as it is imperative that you return to your primary care physician (or establish a relationship with a primary care physician if you do not have one) for your aftercare needs so that they can reassess your need for medications and monitor your lab values.  You were cared for by a hospitalist during your hospital stay. If you have any questions about your discharge medications or the care you received while you were in the hospital after you are discharged, you can call the unit and asked to speak with the hospitalist on call if the hospitalist that took care of you is not available. Once you are discharged, your primary care physician will handle any further medical issues. Please note that NO REFILLS for any discharge medications will be authorized once you are discharged, as it is imperative that you return to your primary care physician (or establish  a relationship with a primary care physician if you do not have one) for your aftercare needs so that they can reassess your need for medications and monitor your lab values.  Please request your Prim.MD to go over all Hospital Tests and Procedure/Radiological results at the follow up, please get all Hospital records sent to your Prim MD by signing hospital release before you go home.  Get CBC, CMP, 2 view Chest X ray checked  by Primary MD during your next visit or SNF MD in 5-7 days ( we routinely change or add medications that can affect your baseline labs and fluid status, therefore we recommend that you get the mentioned basic workup next visit with your PCP, your PCP may decide not to get them or add new tests based on their clinical decision)  On your next visit with your primary  care physician please Get Medicines reviewed and adjusted.  If you experience worsening of your admission symptoms, develop shortness of breath, life threatening emergency, suicidal or homicidal thoughts you must seek medical attention immediately by calling 911 or calling your MD immediately  if symptoms less severe.  You Must read complete instructions/literature along with all the possible adverse reactions/side effects for all the Medicines you take and that have been prescribed to you. Take any new Medicines after you have completely understood and accpet all the possible adverse reactions/side effects.   Do not drive, operate heavy machinery, perform activities at heights, swimming or participation in water activities or provide baby sitting services if your were admitted for syncope or siezures until you have seen by Primary MD or a Neurologist and advised to do so again.  Do not drive when taking Pain medications.   Procedures/Studies: DG Chest Port 1 View Result Date: 01/02/2024 CLINICAL DATA:  Sepsis.  Fever. EXAM: PORTABLE CHEST 1 VIEW COMPARISON:  12/17/2023 FINDINGS: The right IJ Port-A-Cath is stable The cardiac silhouette, mediastinal and hilar contours are within normal limits. The lungs are clear of an acute process. No infiltrates or effusions. No pulmonary lesions. The bony thorax is intact. IMPRESSION: No acute cardiopulmonary findings. Electronically Signed   By: Rudie Meyer M.D.   On: 01/02/2024 21:48   NM PET Image Initial (PI) Skull Base To Thigh (F-18 FDG) Result Date: 12/29/2023 CLINICAL DATA:  Subsequent treatment strategy for colorectal carcinoma. History of lymphoma. EXAM: NUCLEAR MEDICINE PET SKULL BASE TO THIGH TECHNIQUE: 11.9 mCi F-18 FDG was injected intravenously. Full-ring PET imaging was performed from the skull base to thigh after the radiotracer. CT data was obtained and used for attenuation correction and anatomic localization. Fasting blood glucose: 90 mg/dl  COMPARISON:  CT 09/81/1914, DOTATATE PET-CT 04/21/2023 FINDINGS: NECK: focal metabolic lesion in the RIGHT parotid gland is likely a primary parotid neoplasm. Incidental CT findings: None. CHEST: No hypermetabolic mediastinal or hilar nodes. No suspicious pulmonary nodules on the CT scan. Incidental CT findings: Port in the anterior chest wall with tip in distal SVC. ABDOMEN/PELVIS: Innumerable round intensely hypermetabolic lesions throughout the LEFT and RIGHT hepatic lobes. These correspond to the hypoenhancing lesion on comparison CT. Lesions are intense. For example RIGHT hepatic lobe lesion with SUV max equal 14.2. LEFT hepatic lobe lesion with SUV max equal 14.4 There is hypermetabolic activity in the cecum on image 167 with SUV max equal 14.8. There is a hypermetabolic lymph node adjacent to the cecum along the medial border measuring 14 mm on image 163 with SUV max equal 6.7. Smaller hypermetabolic lymph node in the ileocecal  mesentery on image 157 measures 9 mm with SUV max equal 5.8. There is a cluster of hypermetabolic lymph nodes in the LEFT inguinal nodal station with SUV max equal 7.2 on image 203. Incidental CT findings: None. SKELETON: No focal hypermetabolic activity to suggest skeletal metastasis. Incidental CT findings: None. IMPRESSION: 1. Innumerable hypermetabolic liver lesions consistent with hepatic metastasis. 2. Hypermetabolic cecal mass concerning for primary colorectal carcinoma. 3. Hypermetabolic lymph nodes adjacent to the cecum and in the ileocecal mesentery consistent with nodal metastasis. 4. Hypermetabolic lymph nodes in the LEFT inguinal nodal station consistent with nodal metastasis. 5. No evidence of metastatic adenopathy in the chest. 6. 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 Electronically Signed   By: Genevive Bi M.D.   On: 12/29/2023 10:24   US BIOPSY (LIVER) Result Date: 12/22/2023 INDICATION: History of follicular  lymphoma with development of multiple liver lesions. EXAM: ULTRASOUND GUIDED CORE BIOPSY OF LIVER MASS MEDICATIONS: None. ANESTHESIA/SEDATION: Moderate (conscious) sedation was employed during this procedure. A total of Versed 1.0 mg and Fentanyl 50 mcg was administered intravenously. Moderate Sedation Time: 16 minutes. The patient's level of consciousness and vital signs were monitored continuously by radiology nursing throughout the procedure under my direct supervision. PROCEDURE: The procedure, risks, benefits, and alternatives were explained to the patient. Questions regarding the procedure were encouraged and answered. The patient understands and consents to the procedure. A time-out was performed prior to initiating the procedure. The abdominal wall was prepped with chlorhexidine in a sterile fashion, and a sterile drape was applied covering the operative field. A sterile gown and sterile gloves were used for the procedure. Local anesthesia was provided with 1% Lidocaine. Ultrasound was performed to localize liver lesions. A lesion in the left lobe of the liver was chosen for biopsy. Under CT guidance, a 17 gauge trocar needle was advanced into the lesion. After confirming needle tip position, coaxial 18 gauge core biopsy samples were obtained. A total of 5 samples were submitted in both saline and formalin. The outer needle was removed and additional ultrasound performed. COMPLICATIONS: None immediate. FINDINGS: Rounded mass within the left lobe of the liver measures up to 5.7 cm in maximum diameter. Solid tissue was obtained. IMPRESSION: Ultrasound-guided core biopsy performed of a mass in the left lobe of the liver measuring up to 5.7 cm in maximum diameter. Electronically Signed   By: Irish Lack M.D.   On: 12/22/2023 09:40   CT ABDOMEN PELVIS W CONTRAST Result Date: 12/18/2023 CLINICAL DATA:  Abdominal pain, acute, nonlocalized. Shortness of breath. EXAM: CT ABDOMEN AND PELVIS WITH CONTRAST  TECHNIQUE: Multidetector CT imaging of the abdomen and pelvis was performed using the standard protocol following bolus administration of intravenous contrast. RADIATION DOSE REDUCTION: This exam was performed according to the departmental dose-optimization program which includes automated exposure control, adjustment of the mA and/or kV according to patient size and/or use of iterative reconstruction technique. CONTRAST:  OMNIPAQUE IOHEXOL 350 MG/ML SOLN COMPARISON:  12/05/2023 FINDINGS: Lower chest: No acute abnormality Hepatobiliary: Numerous hepatic lesions are again noted similar prior study compatible with metastases. Gallbladder unremarkable. Pancreas: No focal abnormality or ductal dilatation. Spleen: No focal abnormality.  Normal size. Adrenals/Urinary Tract: No adrenal abnormality. No focal renal abnormality. No stones or hydronephrosis. Urinary bladder is unremarkable. Stomach/Bowel: Stomach, large and small bowel grossly unremarkable. Normal appendix. Vascular/Lymphatic: Aortic atherosclerosis. No aneurysm. Multiple enlarged retroperitoneal, inguinal and central mesenteric lymph nodes, stable since prior study. Reproductive: No visible focal abnormality.  Other: No free fluid or free air. Small left inguinal hernia containing fat. Musculoskeletal: No acute bony abnormality. IMPRESSION: Continued numerous large masses throughout the liver compatible with metastases, unchanged since recent study. Unchanged multi station lymphadenopathy. Aortic atherosclerosis. Electronically Signed   By: Charlett Nose M.D.   On: 12/18/2023 00:57   CT Angio Chest PE W/Cm &/Or Wo Cm Result Date: 12/18/2023 CLINICAL DATA:  Pulmonary embolism (PE) suspected, low to intermediate prob, positive D-dimer. Shortness of breath EXAM: CT ANGIOGRAPHY CHEST WITH CONTRAST TECHNIQUE: Multidetector CT imaging of the chest was performed using the standard protocol during bolus administration of intravenous contrast. Multiplanar CT  image reconstructions and MIPs were obtained to evaluate the vascular anatomy. RADIATION DOSE REDUCTION: This exam was performed according to the departmental dose-optimization program which includes automated exposure control, adjustment of the mA and/or kV according to patient size and/or use of iterative reconstruction technique. CONTRAST:  OMNIPAQUE IOHEXOL 350 MG/ML SOLN COMPARISON:  12/05/2023 FINDINGS: Cardiovascular: No filling defects in the pulmonary arteries to suggest pulmonary emboli. Heart mildly enlarged. Scattered coronary artery and aortic atherosclerosis. Mediastinum/Nodes: No mediastinal, hilar, or axillary adenopathy. Trachea and esophagus are unremarkable. Thyroid unremarkable. Lungs/Pleura: Lungs are clear. No focal airspace opacities or suspicious nodules. No effusions. Upper Abdomen: See abdominal CT report Musculoskeletal: Chest wall soft tissues are unremarkable. No acute bony abnormality. Review of the MIP images confirms the above findings. IMPRESSION: No evidence of pulmonary embolus. No acute cardiopulmonary disease. Coronary artery disease. Aortic Atherosclerosis (ICD10-I70.0). Electronically Signed   By: Charlett Nose M.D.   On: 12/18/2023 00:54   DG Chest Port 1 View Result Date: 12/17/2023 CLINICAL DATA:  Worsening shortness of breath. EXAM: PORTABLE CHEST 1 VIEW COMPARISON:  April 21, 2022 FINDINGS: A right-sided venous Port-A-Cath is in place, with its distal tip seen at the junction of the superior vena cava and right atrium. The heart size and mediastinal contours are within normal limits. Both lungs are clear. The visualized skeletal structures are unremarkable. IMPRESSION: No active disease. Electronically Signed   By: Aram Candela M.D.   On: 12/17/2023 22:22     The results of significant diagnostics from this hospitalization (including imaging, microbiology, ancillary and laboratory) are listed below for reference.     Microbiology: Recent Results (from  the past 240 hours)  Resp panel by RT-PCR (RSV, Flu A&B, Covid) Anterior Nasal Swab     Status: Abnormal   Collection Time: 01/02/24  8:19 PM   Specimen: Anterior Nasal Swab  Result Value Ref Range Status   SARS Coronavirus 2 by RT PCR NEGATIVE NEGATIVE Final    Comment: (NOTE) SARS-CoV-2 target nucleic acids are NOT DETECTED.  The SARS-CoV-2 RNA is generally detectable in upper respiratory specimens during the acute phase of infection. The lowest concentration of SARS-CoV-2 viral copies this assay can detect is 138 copies/mL. A negative result does not preclude SARS-Cov-2 infection and should not be used as the sole basis for treatment or other patient management decisions. A negative result may occur with  improper specimen collection/handling, submission of specimen other than nasopharyngeal swab, presence of viral mutation(s) within the areas targeted by this assay, and inadequate number of viral copies(<138 copies/mL). A negative result must be combined with clinical observations, patient history, and epidemiological information. The expected result is Negative.  Fact Sheet for Patients:  BloggerCourse.com  Fact Sheet for Healthcare Providers:  SeriousBroker.it  This test is no t yet approved or cleared by the [place] FDA and  has been  authorized for detection and/or diagnosis of SARS-CoV-2 by FDA under an Emergency Use Authorization (EUA). This EUA will remain  in effect (meaning this test can be used) for the duration of the COVID-19 declaration under Section 564(b)(1) of the Act, 21 U.S.C.section 360bbb-3(b)(1), unless the authorization is terminated  or revoked sooner.       Influenza A by PCR POSITIVE (A) NEGATIVE Final   Influenza B by PCR NEGATIVE NEGATIVE Final    Comment: (NOTE) The Xpert Xpress SARS-CoV-2/FLU/RSV plus assay is intended as an aid in the diagnosis of influenza from Nasopharyngeal swab  specimens and should not be used as a sole basis for treatment. Nasal washings and aspirates are unacceptable for Xpert Xpress SARS-CoV-2/FLU/RSV testing.  Fact Sheet for Patients: BloggerCourse.com  Fact Sheet for Healthcare Providers: SeriousBroker.it  This test is not yet approved or cleared by the [place] FDA and has been authorized for detection and/or diagnosis of SARS-CoV-2 by FDA under an Emergency Use Authorization (EUA). This EUA will remain in effect (meaning this test can be used) for the duration of the COVID-19 declaration under Section 564(b)(1) of the Act, 21 U.S.C. section 360bbb-3(b)(1), unless the authorization is terminated or revoked.     Resp Syncytial Virus by PCR NEGATIVE NEGATIVE Final    Comment: (NOTE) Fact Sheet for Patients: BloggerCourse.com  Fact Sheet for Healthcare Providers: SeriousBroker.it  This test is not yet approved or cleared by the [place] FDA and has been authorized for detection and/or diagnosis of SARS-CoV-2 by FDA under an Emergency Use Authorization (EUA). This EUA will remain in effect (meaning this test can be used) for the duration of the COVID-19 declaration under Section 564(b)(1) of the Act, 21 U.S.C. section 360bbb-3(b)(1), unless the authorization is terminated or revoked.  Performed at [facility], [address] [place], [place] [address]   Blood Culture (routine x 2)     Status: None (Preliminary result)   Collection Time: 01/02/24  9:00 PM   Specimen: BLOOD  Result Value Ref Range Status   Specimen Description   Final    BLOOD RIGHT ANTECUBITAL Performed at [facility], [address] [place], [place] [address]    Special Requests   Final    BOTTLES DRAWN AEROBIC AND ANAEROBIC Blood Culture adequate volume Performed at [facility], [address] [place], [place] [address]    Culture   Final    NO GROWTH 3 DAYS Performed at [facility], [address] [place], [place] [address]    Report Status PENDING  Incomplete  Blood Culture (routine x 2)     Status: None (Preliminary result)   Collection Time: 01/02/24  9:01 PM   Specimen: BLOOD  Result Value Ref Range Status   Specimen Description   Final    BLOOD BLOOD LEFT HAND Performed at [facility], [address] [place], [place] [address]    Special Requests   Final    BOTTLES DRAWN AEROBIC ONLY Blood Culture results may not be optimal due to an inadequate volume of blood received in culture bottles Performed at [facility], [address] [place], [place] [address]    Culture   Final    NO GROWTH 3 DAYS Performed at [facility], [address], [place], [place] [address]    Report Status PENDING  Incomplete     Labs: BNP (last 3 results) Recent Labs    12/08/23 1144  BNP 42.3   Basic Metabolic Panel: Recent Labs  Lab 12/30/23 1254 12/30/23 1254 01/02/24 2100 01/02/24 2106 01/03/24 0424 01/03/24 1757 01/04/24 0332 01/05/24 0355  NA 132*  --  135 134*  --  135 135 133*  K 4.2  --  5.0 5.0  --  4.6 4.5 4.4  CL 98  --  101 103  --  104 103 102  CO2 26  --  20*  --   --  20* 23 21*  GLUCOSE 137*  --  113* 108*  --  126* 115* 101*  BUN 22  --  49* 47*  --  43* 39* 35*  CREATININE 1.04   < > 2.33* 2.60* 2.31* 1.83* 1.62* 1.47*  CALCIUM 8.9  --  8.8*  --   --  8.4* 8.2* 8.3*  MG  --   --   --   --   --   --  2.0 2.0   < > = values in this interval not displayed.   Liver Function Tests: Recent Labs  Lab 12/30/23 1254 01/02/24 2100 01/03/24 1757  AST 120* 139* 114*  ALT 63* 88* 68*  ALKPHOS 639* 608* 491*  BILITOT 1.2 1.1 1.7*  PROT 6.2* 6.7 5.5*  ALBUMIN 3.3* 2.9* 2.4*   Recent Labs  Lab 01/02/24 2100  LIPASE 31   No results for input(s): "AMMONIA" in the last 168  hours. CBC: Recent Labs  Lab 12/30/23 1254 12/30/23 1254 01/02/24 2100 01/02/24 2106 01/03/24 0424 01/03/24 1757 01/04/24 0332 01/05/24 0355  WBC 11.7*  --  15.0*  --  11.6* 9.6 8.7 8.9  NEUTROABS 9.9*  --  12.9*  --   --   --   --   --   HGB 8.5*   < > 8.8* 9.5* 7.8* 7.3* 6.8* 8.0*  HCT 26.9*  --  28.0* 28.0* 24.8* 23.3* 21.8* 25.4*  MCV 79.4*  --  78.7*  --  79.0* 79.5* 79.0* 81.2  PLT 507*  --  584*  --  419* 345 305 307   < > = values in this interval not displayed.   Cardiac Enzymes: No results for input(s): "CKTOTAL", "CKMB", "CKMBINDEX", "TROPONINI" in the last 168 hours. BNP: Invalid input(s): "POCBNP" CBG: No results for input(s): "GLUCAP" in the last 168 hours. D-Dimer No results for input(s): "DDIMER" in the last 72 hours. Hgb A1c No results for input(s): "HGBA1C" in the last 72 hours. Lipid Profile No results for input(s): "CHOL", "HDL", "LDLCALC", "TRIG", "CHOLHDL", "LDLDIRECT" in the last 72 hours. Thyroid function studies No results for input(s): "TSH", "T4TOTAL", "T3FREE", "THYROIDAB" in the last 72 hours.  Invalid input(s): "FREET3" Anemia work up No results for input(s): "VITAMINB12", "FOLATE", "FERRITIN", "TIBC", "IRON", "RETICCTPCT" in the last 72 hours. Urinalysis    Component Value Date/Time   COLORURINE YELLOW 01/02/2024 2054   APPEARANCEUR HAZY (A) 01/02/2024 2054   LABSPEC 1.012 01/02/2024 2054   PHURINE 5.0 01/02/2024 2054   GLUCOSEU NEGATIVE 01/02/2024 2054   HGBUR NEGATIVE 01/02/2024 2054   BILIRUBINUR NEGATIVE 01/02/2024 2054   KETONESUR NEGATIVE 01/02/2024 2054   PROTEINUR 30 (A) 01/02/2024 2054   NITRITE NEGATIVE 01/02/2024 2054   LEUKOCYTESUR NEGATIVE 01/02/2024 2054   Sepsis Labs Recent Labs  Lab 01/03/24 0424 01/03/24 1757 01/04/24 0332 01/05/24 0355  WBC 11.6* 9.6 8.7 8.9   Microbiology Recent Results (from the past 240 hours)  Resp panel by RT-PCR (RSV, Flu A&B, Covid) Anterior Nasal Swab     Status: Abnormal  Collection Time: 01/02/24  8:19 PM   Specimen: Anterior Nasal Swab  Result Value Ref Range Status   SARS Coronavirus 2 by RT PCR NEGATIVE NEGATIVE Final    Comment: (NOTE) SARS-CoV-2 target nucleic acids are NOT DETECTED.  The SARS-CoV-2 RNA is generally detectable in upper respiratory specimens during the acute phase of infection. The lowest concentration of SARS-CoV-2 viral copies this assay can detect is 138 copies/mL. A negative result does not preclude SARS-Cov-2 infection and should not be used as the sole basis for treatment or other patient management decisions. A negative result may occur with  improper specimen collection/handling, submission of specimen other than nasopharyngeal swab, presence of viral mutation(s) within the areas targeted by this assay, and inadequate number of viral copies(<138 copies/mL). A negative result must be combined with clinical observations, patient history, and epidemiological information. The expected result is Negative.  Fact Sheet for Patients:  BloggerCourse.com  Fact Sheet for Healthcare Providers:  SeriousBroker.it  This test is no t yet approved or cleared by the [place] FDA and  has been authorized for detection and/or diagnosis of SARS-CoV-2 by FDA under an Emergency Use Authorization (EUA). This EUA will remain  in effect (meaning this test can be used) for the duration of the COVID-19 declaration under Section 564(b)(1) of the Act, 21 U.S.C.section 360bbb-3(b)(1), unless the authorization is terminated  or revoked sooner.       Influenza A by PCR POSITIVE (A) NEGATIVE Final   Influenza B by PCR NEGATIVE NEGATIVE Final    Comment: (NOTE) The Xpert Xpress SARS-CoV-2/FLU/RSV plus assay is intended as an aid in the diagnosis of influenza from Nasopharyngeal swab specimens and should not be used as a sole basis for treatment. Nasal washings and aspirates are unacceptable for  Xpert Xpress SARS-CoV-2/FLU/RSV testing.  Fact Sheet for Patients: BloggerCourse.com  Fact Sheet for Healthcare Providers: SeriousBroker.it  This test is not yet approved or cleared by the [place] FDA and has been authorized for detection and/or diagnosis of SARS-CoV-2 by FDA under an Emergency Use Authorization (EUA). This EUA will remain in effect (meaning this test can be used) for the duration of the COVID-19 declaration under Section 564(b)(1) of the Act, 21 U.S.C. section 360bbb-3(b)(1), unless the authorization is terminated or revoked.     Resp Syncytial Virus by PCR NEGATIVE NEGATIVE Final    Comment: (NOTE) Fact Sheet for Patients: BloggerCourse.com  Fact Sheet for Healthcare Providers: SeriousBroker.it  This test is not yet approved or cleared by the [place] FDA and has been authorized for detection and/or diagnosis of SARS-CoV-2 by FDA under an Emergency Use Authorization (EUA). This EUA will remain in effect (meaning this test can be used) for the duration of the COVID-19 declaration under Section 564(b)(1) of the Act, 21 U.S.C. section 360bbb-3(b)(1), unless the authorization is terminated or revoked.  Performed at [facility], [address] [place], [place] [address]   Blood Culture (routine x 2)     Status: None (Preliminary result)   Collection Time: 01/02/24  9:00 PM   Specimen: BLOOD  Result Value Ref Range Status   Specimen Description   Final    BLOOD RIGHT ANTECUBITAL Performed at [facility], [address] [place], [place] [address]    Special Requests   Final    BOTTLES DRAWN AEROBIC AND ANAEROBIC Blood Culture adequate volume Performed at [facility], [address] [place], [place] [address]    Culture   Final  NO GROWTH 3 DAYS Performed at [facility],  [address], [place], [place] [address]    Report Status PENDING  Incomplete  Blood Culture (routine x 2)     Status: None (Preliminary result)   Collection Time: 01/02/24  9:01 PM   Specimen: BLOOD  Result Value Ref Range Status   Specimen Description   Final    BLOOD BLOOD LEFT HAND Performed at [facility], [address], [place], [place] [address]    Special Requests   Final    BOTTLES DRAWN AEROBIC ONLY Blood Culture results may not be optimal due to an inadequate volume of blood received in culture bottles Performed at [facility], [address] [place], [place] [address]    Culture   Final    NO GROWTH 3 DAYS Performed at [facility], [address] [place], [place] [address]    Report Status PENDING  Incomplete     Time coordinating discharge:  I have spent 35 minutes face to face with the patient and on the ward discussing the patients care, assessment, plan and disposition with other care givers. >50% of the time was devoted counseling the patient about the risks and benefits of treatment/Discharge disposition and coordinating care.   SIGNED:   Miguel Rota, MD  [facility] 01/05/2024, 10:20 AM   If 7PM-7AM, please contact night-coverage

## 2024-01-05 NOTE — Plan of Care (Signed)

## 2024-01-06 ENCOUNTER — Telehealth: Payer: Self-pay

## 2024-01-06 NOTE — Transitions of Care (Post Inpatient/ED Visit) (Signed)
 01/06/2024  Name: Jorge Gould MRN: [id number] DOB: Oct 07, 1956  Today's TOC FU Call Status: Today's TOC FU Call Status:: Successful TOC FU Call Completed TOC FU Call Complete Date: 01/06/24 [name] Name and Date of Birth confirmed.  Transition Care Management Follow-up Telephone Call Date of Discharge: 01/05/24 Discharge Facility: [facility]) Type of Discharge: Inpatient Admission Primary Inpatient Discharge Diagnosis:: Flu How have you been since you were released from the hospital?: Better Any questions or concerns?: No  Items Reviewed: Did you receive and understand the discharge instructions provided?: Yes Medications obtained,verified, and reconciled?: Yes (Medications Reviewed) Any new allergies since your discharge?: No Dietary orders reviewed?: NA Do you have support at home?: Yes People in Home: spouse Name of Support/Comfort Primary Source: Danella Maiers  Medications Reviewed Today: Medications Reviewed Today     Reviewed by Redge Gainer, RN (Case Manager) on 01/06/24 at 1228  Med List Status: <None>   Medication Order Taking? Sig Documenting Provider Last Dose Status Informant  acetaminophen (TYLENOL) 500 MG tablet [id number]  Take 2 tablets (1,000 mg total) by mouth every 6 (six) hours as needed. Carlisle Beers, FNP  Active Self, Pharmacy Records  benzonatate (TESSALON) 200 MG capsule [id number]  Take 1 capsule (200 mg total) by mouth 3 (three) times daily as needed for cough. Tysinger, Kermit Balo, PA-C  Active Self, Pharmacy Records  omeprazole (PRILOSEC) 40 MG capsule [id number]  Take 40 mg by mouth daily.  Patient not taking: Reported on 01/02/2024   [provider]  Active Self, Pharmacy Records  oseltamivir (TAMIFLU) 30 MG capsule [id number]  Take 1 capsule (30 mg total) by mouth 2 (two) times daily for 3 days. Miguel Rota, MD  Active   oxyCODONE (OXY IR/ROXICODONE) 5 MG immediate release tablet [id number]  Take 1 tablet (5 mg total) by  mouth every 4 (four) hours as needed for severe pain (pain score 7-10). Jaci Standard, MD  Active Self, Pharmacy Records  predniSONE (DELTASONE) 20 MG tablet [id number] Yes 3 tablets today, 2 tablets tomorrow, 1 tablet the third day Tysinger, Kermit Balo, PA-C Taking Active Self, Pharmacy Records  valsartan (DIOVAN) 40 MG tablet [id number]  Take 1 tablet (40 mg total) by mouth daily.  Patient not taking: Reported on 01/02/2024   Jac Canavan, PA-C  Active Self, Pharmacy Records            Home Care and Equipment/Supplies: Were Home Health Services Ordered?: NA Any new equipment or medical supplies ordered?: NA  Functional Questionnaire: Do you need assistance with bathing/showering or dressing?: No Do you need assistance with meal preparation?: No Do you need assistance with eating?: No Do you have difficulty maintaining continence: No Do you need assistance with getting out of bed/getting out of a chair/moving?: No Do you have difficulty managing or taking your medications?: No  Follow up appointments reviewed: PCP Follow-up appointment confirmed?: Yes Date of PCP follow-up appointment?: 01/11/24 Follow-up Provider: [facility] Follow-up appointment confirmed?: Yes Date of Specialist follow-up appointment?: 01/18/24 Follow-Up Specialty Provider:: Jeanie Sewer Do you need transportation to your follow-up appointment?: No Do you understand care options if your condition(s) worsen?: Yes-patient verbalized understanding  SDOH Interventions Today    Flowsheet Row Most Recent Value  SDOH Interventions   Food Insecurity Interventions Intervention Not Indicated  Housing Interventions Intervention Not Indicated  Transportation Interventions Intervention Not Indicated  Utilities Interventions Intervention Not Indicated      Interventions Today    Flowsheet Row Most  Recent Value  Chronic Disease   Chronic disease during today's visit Other  [Lymphoma]   General Interventions   General Interventions Discussed/Reviewed General Interventions Discussed, General Interventions Reviewed, Doctor Visits  Doctor Visits Discussed/Reviewed Doctor Visits Reviewed, PCP, Specialist  PCP/Specialist Visits Compliance with follow-up visit  Education Interventions   Education Provided Provided Education  Provided Verbal Education On When to see the doctor, Medication, Sick Day Rules  Nutrition Interventions   Nutrition Discussed/Reviewed Nutrition Reviewed  Pharmacy Interventions   Pharmacy Dicussed/Reviewed Medications and their functions     TOC Outreach completed today. The patient is recovering from the Flu. His illness is complicated by Lymphoma. He has new treatment next week. He is trying to work full-time while undergoing infusion. He did make a PCP appointment today but states he doesn't know how he will respond to this treatment and he may have to cancel.  Deidre Ala, BSN, RN [place]  [facility] - [employer] [facility] RN Care Manager [phone]

## 2024-01-07 ENCOUNTER — Inpatient Hospital Stay: Payer: Medicare Other

## 2024-01-07 ENCOUNTER — Other Ambulatory Visit: Payer: Self-pay

## 2024-01-07 LAB — CULTURE, BLOOD (ROUTINE X 2)
Culture: NO GROWTH
Culture: NO GROWTH
Special Requests: ADEQUATE

## 2024-01-08 ENCOUNTER — Other Ambulatory Visit: Payer: Self-pay | Admitting: Hematology and Oncology

## 2024-01-09 ENCOUNTER — Emergency Department (HOSPITAL_COMMUNITY)

## 2024-01-09 ENCOUNTER — Inpatient Hospital Stay

## 2024-01-09 ENCOUNTER — Encounter: Payer: Self-pay | Admitting: Hematology and Oncology

## 2024-01-09 ENCOUNTER — Other Ambulatory Visit: Payer: Self-pay | Admitting: Physician Assistant

## 2024-01-09 ENCOUNTER — Emergency Department (HOSPITAL_COMMUNITY)
Admission: EM | Admit: 2024-01-09 | Discharge: 2024-01-09 | Disposition: A | Discharge status: Home / Self Care | Attending: Emergency Medicine | Admitting: Emergency Medicine

## 2024-01-09 ENCOUNTER — Inpatient Hospital Stay: Attending: Hematology and Oncology | Admitting: Physician Assistant

## 2024-01-09 ENCOUNTER — Encounter (HOSPITAL_COMMUNITY): Payer: Self-pay

## 2024-01-09 ENCOUNTER — Other Ambulatory Visit: Payer: Self-pay | Admitting: *Deleted

## 2024-01-09 ENCOUNTER — Telehealth: Payer: Self-pay | Admitting: *Deleted

## 2024-01-09 VITALS — BP 158/143 | HR 93 | Temp 97.7°F | Resp 16

## 2024-01-09 DIAGNOSIS — R899 Unspecified abnormal finding in specimens from other organs, systems and tissues: Secondary | ICD-10-CM

## 2024-01-09 DIAGNOSIS — R109 Unspecified abdominal pain: Secondary | ICD-10-CM | POA: Diagnosis not present

## 2024-01-09 DIAGNOSIS — K4090 Unilateral inguinal hernia, without obstruction or gangrene, not specified as recurrent: Secondary | ICD-10-CM | POA: Insufficient documentation

## 2024-01-09 DIAGNOSIS — C799 Secondary malignant neoplasm of unspecified site: Secondary | ICD-10-CM

## 2024-01-09 DIAGNOSIS — R188 Other ascites: Secondary | ICD-10-CM | POA: Diagnosis not present

## 2024-01-09 DIAGNOSIS — Z833 Family history of diabetes mellitus: Secondary | ICD-10-CM | POA: Insufficient documentation

## 2024-01-09 DIAGNOSIS — R140 Abdominal distension (gaseous): Secondary | ICD-10-CM | POA: Diagnosis not present

## 2024-01-09 DIAGNOSIS — C787 Secondary malignant neoplasm of liver and intrahepatic bile duct: Secondary | ICD-10-CM | POA: Insufficient documentation

## 2024-01-09 DIAGNOSIS — Z9181 History of falling: Secondary | ICD-10-CM | POA: Insufficient documentation

## 2024-01-09 DIAGNOSIS — Z801 Family history of malignant neoplasm of trachea, bronchus and lung: Secondary | ICD-10-CM | POA: Insufficient documentation

## 2024-01-09 DIAGNOSIS — C8228 Follicular lymphoma grade III, unspecified, lymph nodes of multiple sites: Secondary | ICD-10-CM | POA: Insufficient documentation

## 2024-01-09 DIAGNOSIS — R180 Malignant ascites: Secondary | ICD-10-CM | POA: Diagnosis not present

## 2024-01-09 DIAGNOSIS — C7A8 Other malignant neuroendocrine tumors: Secondary | ICD-10-CM | POA: Insufficient documentation

## 2024-01-09 DIAGNOSIS — I10 Essential (primary) hypertension: Secondary | ICD-10-CM | POA: Insufficient documentation

## 2024-01-09 DIAGNOSIS — C785 Secondary malignant neoplasm of large intestine and rectum: Secondary | ICD-10-CM | POA: Insufficient documentation

## 2024-01-09 DIAGNOSIS — Z95828 Presence of other vascular implants and grafts: Secondary | ICD-10-CM | POA: Diagnosis not present

## 2024-01-09 DIAGNOSIS — C180 Malignant neoplasm of cecum: Secondary | ICD-10-CM | POA: Insufficient documentation

## 2024-01-09 DIAGNOSIS — K769 Liver disease, unspecified: Secondary | ICD-10-CM | POA: Diagnosis not present

## 2024-01-09 DIAGNOSIS — Z9089 Acquired absence of other organs: Secondary | ICD-10-CM | POA: Insufficient documentation

## 2024-01-09 DIAGNOSIS — Z825 Family history of asthma and other chronic lower respiratory diseases: Secondary | ICD-10-CM | POA: Insufficient documentation

## 2024-01-09 DIAGNOSIS — Z7189 Other specified counseling: Secondary | ICD-10-CM | POA: Insufficient documentation

## 2024-01-09 DIAGNOSIS — I2510 Atherosclerotic heart disease of native coronary artery without angina pectoris: Secondary | ICD-10-CM | POA: Insufficient documentation

## 2024-01-09 DIAGNOSIS — I700 Atherosclerosis of aorta: Secondary | ICD-10-CM | POA: Insufficient documentation

## 2024-01-09 DIAGNOSIS — C189 Malignant neoplasm of colon, unspecified: Secondary | ICD-10-CM | POA: Diagnosis not present

## 2024-01-09 DIAGNOSIS — Z8572 Personal history of non-Hodgkin lymphomas: Secondary | ICD-10-CM | POA: Insufficient documentation

## 2024-01-09 DIAGNOSIS — E669 Obesity, unspecified: Secondary | ICD-10-CM | POA: Insufficient documentation

## 2024-01-09 DIAGNOSIS — I517 Cardiomegaly: Secondary | ICD-10-CM | POA: Insufficient documentation

## 2024-01-09 DIAGNOSIS — G4700 Insomnia, unspecified: Secondary | ICD-10-CM | POA: Insufficient documentation

## 2024-01-09 DIAGNOSIS — D649 Anemia, unspecified: Secondary | ICD-10-CM | POA: Insufficient documentation

## 2024-01-09 DIAGNOSIS — Z8249 Family history of ischemic heart disease and other diseases of the circulatory system: Secondary | ICD-10-CM | POA: Insufficient documentation

## 2024-01-09 DIAGNOSIS — Z79899 Other long term (current) drug therapy: Secondary | ICD-10-CM | POA: Insufficient documentation

## 2024-01-09 LAB — CBC WITH DIFFERENTIAL (CANCER CENTER ONLY)
Abs Immature Granulocytes: 0.47 10*3/uL — ABNORMAL HIGH (ref 0.00–0.07)
Basophils Absolute: 0 10*3/uL (ref 0.0–0.1)
Basophils Relative: 0 %
Eosinophils Absolute: 0 10*3/uL (ref 0.0–0.5)
Eosinophils Relative: 0 %
HCT: 27.2 % — ABNORMAL LOW (ref 39.0–52.0)
Hemoglobin: 8.9 g/dL — ABNORMAL LOW (ref 13.0–17.0)
Immature Granulocytes: 4 %
Lymphocytes Relative: 8 %
Lymphs Abs: 0.9 10*3/uL (ref 0.7–4.0)
MCH: 24.9 pg — ABNORMAL LOW (ref 26.0–34.0)
MCHC: 32.7 g/dL (ref 30.0–36.0)
MCV: 76.2 fL — ABNORMAL LOW (ref 80.0–100.0)
Monocytes Absolute: 1 10*3/uL (ref 0.1–1.0)
Monocytes Relative: 8 %
Neutro Abs: 9.7 10*3/uL — ABNORMAL HIGH (ref 1.7–7.7)
Neutrophils Relative %: 80 %
Platelet Count: 502 10*3/uL — ABNORMAL HIGH (ref 150–400)
RBC: 3.57 MIL/uL — ABNORMAL LOW (ref 4.22–5.81)
RDW: 18 % — ABNORMAL HIGH (ref 11.5–15.5)
WBC Count: 12.1 10*3/uL — ABNORMAL HIGH (ref 4.0–10.5)
nRBC: 0.2 % (ref 0.0–0.2)

## 2024-01-09 LAB — CMP (CANCER CENTER ONLY)
ALT: 64 U/L — ABNORMAL HIGH (ref 0–44)
AST: 163 U/L — ABNORMAL HIGH (ref 15–41)
Albumin: 3.1 g/dL — ABNORMAL LOW (ref 3.5–5.0)
Alkaline Phosphatase: 1049 U/L — ABNORMAL HIGH (ref 38–126)
Anion gap: 12 (ref 5–15)
BUN: 23 mg/dL (ref 8–23)
CO2: 24 mmol/L (ref 22–32)
Calcium: 8.4 mg/dL — ABNORMAL LOW (ref 8.9–10.3)
Chloride: 102 mmol/L (ref 98–111)
Creatinine: 1.24 mg/dL (ref 0.61–1.24)
GFR, Estimated: >60 mL/min (ref >60)
Glucose, Bld: 130 mg/dL — ABNORMAL HIGH (ref 70–99)
Potassium: 4.4 mmol/L (ref 3.5–5.1)
Sodium: 138 mmol/L (ref 135–145)
Total Bilirubin: 3 mg/dL — ABNORMAL HIGH (ref 0.0–1.2)
Total Protein: 6.2 g/dL — ABNORMAL LOW (ref 6.5–8.1)

## 2024-01-09 LAB — MAGNESIUM: Magnesium: 1.8 mg/dL (ref 1.7–2.4)

## 2024-01-09 MED ORDER — IOHEXOL 350 MG/ML SOLN
100.0000 mL | Freq: Once | INTRAVENOUS | Status: AC | PRN
  Administered 2024-01-09: 100 mL via INTRAVENOUS

## 2024-01-09 MED ORDER — SODIUM CHLORIDE 0.9 % IV SOLN: Freq: Once | INTRAVENOUS | Status: AC

## 2024-01-09 MED ORDER — HEPARIN SOD (PORK) LOCK FLUSH 100 UNIT/ML IV SOLN
500.0000 [IU] | Freq: Once | INTRAVENOUS | Status: AC
  Administered 2024-01-09: 500 [IU]
  Filled 2024-01-09: qty 5

## 2024-01-09 NOTE — ED Notes (Signed)
 Pt found drinking sink water out of urinal

## 2024-01-09 NOTE — Progress Notes (Signed)
 Symptom Management Consult Note [facility]    Patient Care Team: Tysinger, Kermit Balo, PA-C as PCP - General (Family Medicine) Jaci Standard, MD as Consulting Physician (Hematology and Oncology)    Name / MRN / DOB: Jorge Gould  [id number]  December 03, 1955   Date of visit: 01/09/2024   Chief Complaint/Reason for visit: abdominal pain and fatigue    ASSESSMENT & PLAN: Patient is a 68 y.o. male with oncologic history of  follicular lymphoma (2021), neuroendocrine tumor (2024) and recent diagnosis of metastatic colon cancer followed by Dr. Leonides Schanz.  I have viewed most recent oncology note and lab work.    #Metastatic colon cancer - Plan was to start FOLFOX chemotherapy tomorrow outpatient  #Abdominal distention, abnormal labs - Chart review shows recent hospital admission 2/24-2/27/25 for sepsis secondary to Influenza A with pneumonia, anemia s/p transfused 1 unit, AKI. He was discharged 2/27 with improving kidney function BUN 35/1.47.  - Today patient is ill appearing. Exam with scleral icterus and diffuse addominal distention w/ hypoactive bowel sounds. No tenderness. Clear lungs. - CMP today shows rising alk phos (1049) and t. Bili (3.0), LFTs still elevated  compared to labs last week. Creatinine WNL. Labs are not within parameter for treatment tomorrow. Patient received 1L NS for hydration support in clinic. - Discussed with covering oncologist Dr. Candise Che who agrees with plan for emergent evaluation of symptoms as there is concern for obstruction. Patient updated and agreeable with plan.  #Fatigue - Patient reports significant fatigue and lack of energy, possibly related to cancer and recent influenza infection. - Hemoglobin is stable at 8.9. No indication for transfusion at this time.   Report given to accepting ED RN.    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)  03/28/2020 Initial Diagnosis   Follicular  lymphoma of intra-abdominal lymph nodes (HCC)   04/30/2020 - 09/18/2020 Chemotherapy   Patient is on Treatment Plan : NON-HODGKINS LYMPHOMA Rituximab D1 / Bendamustine D1,2 q28d     06/29/2022 - 07/01/2022 Chemotherapy   Patient is on Treatment Plan : NON-HODGKINS LYMPHOMA R-CHOP q21d     06/29/2022 - 10/30/2022 Chemotherapy   Patient is on Treatment Plan : NON-HODGKINS LYMPHOMA R-CHOP q21d     Colon cancer metastasized to liver (HCC)  12/30/2023 Initial Diagnosis   Colon cancer metastasized to liver (HCC)   01/10/2024 -  Chemotherapy   Patient is on Treatment Plan : COLORECTAL FOLFOX + Bevacizumab q14d         Interval history-: Discussed the use of AI scribe software for clinical note transcription with the patient, who gave verbal consent to proceed.   Jorge Gould is a 68 y.o. male with oncologic history as above presenting to [facility] today with chief complaint of abdominal pain and generalized weakness. Patient presents unaccompanied to visit today.  For the past three weeks, he has experienced a progressive inability to eat and a lack of appetite. He feels bloated. He has only consumed small amounts of watermelon and Ensure, noting a lack of desire for other foods. The bloating is uncomfortable although he denies associated pain.  He reports diarrhea with liquid stools that are clear to orange in color, with the last bowel movement occurring this morning. His last soft formed stool was over 3 days ago.   He is also experiencing wheezing when lying down, which improves when sitting up, and shortness of breath with minimal exertion. These symptoms began about a  month ago, coinciding with a recent flu infection, and are progressively worsening. There is no history of asthma or COPD, and he is a non-smoker. No recent fevers since being discharged from the hospital. He took Tamiflu while hospitalized. He was not discharged on antibiotics.   ROS  All other systems are reviewed and are  negative for acute change except as noted in the HPI.    No Known Allergies   Past Medical History:  Diagnosis Date   Cancer (HCC) 2021   follicular lymphoma   Obesity      Past Surgical History:  Procedure Laterality Date   ANKLE FRACTURE SURGERY  2016   right, trauma repair after fall down stairs   HERNIA REPAIR     umbilical   IR IMAGING GUIDED PORT INSERTION  04/09/2020   IR IMAGING GUIDED PORT INSERTION  06/17/2022   IR REMOVAL TUN ACCESS W/ PORT W/O FL MOD SED  11/27/2020   TONSILLECTOMY      Social History   Socioeconomic History   Marital status: Married    Spouse name: Not on file   Number of children: Not on file   Years of education: Not on file   Highest education level: Not on file  Occupational History   Not on file  Tobacco Use   Smoking status: Never   Smokeless tobacco: Never  Vaping Use   Vaping status: Never Used  Substance and Sexual Activity   Alcohol use: Not Currently    Alcohol/week: 3.0 standard drinks of alcohol    Types: 3 Shots of liquor per week   Drug use: Not Currently   Sexual activity: Not Currently  Other Topics Concern   Not on file  Social History Narrative   Engaged, plan to marry in March 2021, been together since 2014.  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    [employer].   Most exercise on the job, working 7 days per week.   No children, but fiance has 5 children.    11/2019   Social Drivers of Health   Financial Resource Strain: Not on file  Food Insecurity: No Food Insecurity (01/06/2024)   Hunger Vital Sign    Worried About Running Out of Food in the Last Year: Never true    Ran Out of Food in the Last Year: Never true  Transportation Needs: No Transportation Needs (01/06/2024)   PRAPARE - Administrator, Civil Service (Medical): No    Lack of Transportation (Non-Medical): No  Physical Activity: Not on file  Stress: Not on file  Social Connections: Socially Isolated (01/03/2024)   Social Connection and  Isolation Panel [NHANES]    Frequency of Communication with Friends and Family: Never    Frequency of Social Gatherings with Friends and Family: Never    Attends Religious Services: Never    Database administrator or Organizations: No    Attends Banker Meetings: Never    Marital Status: Married  Catering manager Violence: Not At Risk (01/06/2024)   Humiliation, Afraid, Rape, and Kick questionnaire    Fear of Current or Ex-Partner: No    Emotionally Abused: No    Physically Abused: No    Sexually Abused: No    Family History  Problem Relation Age of Onset   Cancer Mother        lung; former smoker   COPD Father        emphysema   Diabetes Maternal Aunt    Heart disease  Maternal Uncle    Heart disease Maternal Uncle    Diabetes Maternal Aunt    Stroke Neg Hx    Hyperlipidemia Neg Hx    Hypertension Neg Hx      Current Outpatient Medications:    acetaminophen (TYLENOL) 500 MG tablet, Take 2 tablets (1,000 mg total) by mouth every 6 (six) hours as needed., Disp: 30 tablet, Rfl: 0   benzonatate (TESSALON) 200 MG capsule, Take 1 capsule (200 mg total) by mouth 3 (three) times daily as needed for cough., Disp: 30 capsule, Rfl: 0   omeprazole (PRILOSEC) 40 MG capsule, Take 40 mg by mouth daily. (Patient not taking: Reported on 01/02/2024), Disp: , Rfl:    oxyCODONE (OXY IR/ROXICODONE) 5 MG immediate release tablet, Take 1 tablet (5 mg total) by mouth every 4 (four) hours as needed for severe pain (pain score 7-10)., Disp: 60 tablet, Rfl: 0   predniSONE (DELTASONE) 20 MG tablet, 3 tablets today, 2 tablets tomorrow, 1 tablet the third day, Disp: 6 tablet, Rfl: 0   valsartan (DIOVAN) 40 MG tablet, Take 1 tablet (40 mg total) by mouth daily. (Patient not taking: Reported on 01/02/2024), Disp: 30 tablet, Rfl: 2  PHYSICAL EXAM: ECOG FS:1 - Symptomatic but completely ambulatory    Vitals:   01/09/24 1138 01/09/24 1203  BP: (!) 130/105 (!) 158/143  Pulse: 99 93  Resp: 16    Temp: 97.7 F (36.5 C)   TempSrc: Temporal   SpO2: 100% 99%   Physical Exam Vitals and nursing note reviewed.  Constitutional:      Appearance: He is ill-appearing. He is not toxic-appearing.  HENT:     Head: Normocephalic.  Eyes:     General: Scleral icterus present.     Conjunctiva/sclera: Conjunctivae normal.  Cardiovascular:     Rate and Rhythm: Normal rate and regular rhythm.     Pulses: Normal pulses.     Heart sounds: Normal heart sounds.  Pulmonary:     Effort: Pulmonary effort is normal.     Breath sounds: Normal breath sounds.  Abdominal:     General: There is distension.     Tenderness: There is no abdominal tenderness.     Comments: Hypoactive bowel sounds  Musculoskeletal:     Cervical back: Normal range of motion.  Skin:    General: Skin is warm and dry.  Neurological:     Mental Status: He is alert.        LABORATORY DATA: I have reviewed the data as listed    Latest Ref Rng & Units 01/09/2024   11:37 AM 01/05/2024    3:55 AM 01/04/2024    3:32 AM  CBC  WBC 4.0 - 10.5 K/uL 12.1  8.9  8.7   Hemoglobin 13.0 - 17.0 g/dL 8.9  8.0  6.8   Hematocrit 39.0 - 52.0 % 27.2  25.4  21.8   Platelets 150 - 400 K/uL 502  307  305         Latest Ref Rng & Units 01/09/2024   11:37 AM 01/05/2024    3:55 AM 01/04/2024    3:32 AM  CMP  Glucose 70 - 99 mg/dL 161  096  045   BUN 8 - 23 mg/dL 23  35  39   Creatinine 0.61 - 1.24 mg/dL 4.09  8.11  9.14   Sodium 135 - 145 mmol/L 138  133  135   Potassium 3.5 - 5.1 mmol/L 4.4  4.4  4.5   Chloride 98 -  111 mmol/L 102  102  103   CO2 22 - 32 mmol/L 24  21  23    Calcium 8.9 - 10.3 mg/dL 8.4  8.3  8.2   Total Protein 6.5 - 8.1 g/dL 6.2     Total Bilirubin 0.0 - 1.2 mg/dL 3.0     Alkaline Phos 38 - 126 U/L 1,049     AST 15 - 41 U/L 163     ALT 0 - 44 U/L 64          RADIOGRAPHIC STUDIES (from last 24 hours if applicable) I have personally reviewed the radiological images as listed and agreed with the findings in  the report. No results found.      Visit Diagnosis: 1. Colon cancer metastasized to liver (HCC)   2. Port-A-Cath in place   3. Abdominal distension   4. Abnormal laboratory test      No orders of the defined types were placed in this encounter.   All questions were answered. The patient knows to call the clinic with any problems, questions or concerns. No barriers to learning was detected.  A total of more than 40 minutes were spent on this encounter with face-to-face time and non-face-to-face time, including preparing to see the patient, ordering tests and/or medications, counseling the patient and coordination of care as outlined above.    Thank you for allowing me to participate in the care of this patient.    Shanon Ace, PA-C Department of Hematology/Oncology [facility] at [facility] Phone: [phone]  [phone]    01/09/2024 2:48 PM

## 2024-01-09 NOTE — ED Provider Notes (Signed)
 Jorge Gould   CSN: [id number] Arrival date & time: 01/09/24  1424     History  Chief Complaint  Patient presents with   Abdominal Pain    Jorge Gould is a 68 y.o. male.  HPI     68 year old male with history of follicular lymphoma, hypertension comes in with chief complaint of abdominal distention and bloating.  Patient states that for the last 2 weeks, he has had increasing bloating and distention.  He is supposed to initiate chemotherapy again soon, and saw cancer team today.  They advised her to come to the ER for CT scan before they can initiate his treatment and he can monitor his response.  He has no abdominal pain right now.  He does indicate that his appetite is reduced, he feels bloated and sometimes nauseous.  No emesis, no diarrhea, no bloody stools.  [name] last bowel movement was earlier today.  Home Medications Prior to Admission medications   Medication Sig Start Date End Date Taking? Authorizing Provider  acetaminophen (TYLENOL) 500 MG tablet Take 2 tablets (1,000 mg total) by mouth every 6 (six) hours as needed. 04/21/22   Carlisle Beers, FNP  benzonatate (TESSALON) 200 MG capsule Take 1 capsule (200 mg total) by mouth 3 (three) times daily as needed for cough. 12/13/23   Tysinger, Kermit Balo, PA-C  omeprazole (PRILOSEC) 40 MG capsule Take 40 mg by mouth daily. Patient not taking: Reported on 01/02/2024 12/22/23   [provider]  oxyCODONE (OXY IR/ROXICODONE) 5 MG immediate release tablet Take 1 tablet (5 mg total) by mouth every 4 (four) hours as needed for severe pain (pain score 7-10). 12/30/23   Jaci Standard, MD  predniSONE (DELTASONE) 20 MG tablet 3 tablets today, 2 tablets tomorrow, 1 tablet the third day 12/13/23   Tysinger, Kermit Balo, PA-C  valsartan (DIOVAN) 40 MG tablet Take 1 tablet (40 mg total) by mouth daily. Patient not taking: Reported on 01/02/2024 12/13/23 12/12/24  Jac Canavan, PA-C      Allergies    Patient has no known allergies.    Review of Systems   Review of Systems  All other systems reviewed and are negative.   Physical Exam Updated Vital Signs BP (!) 141/95   Pulse (!) 101   Temp 97.9 F (36.6 C) (Oral)   Resp 20   SpO2 100%  Physical Exam Vitals and nursing Gould reviewed.  Constitutional:      Appearance: He is well-developed.  HENT:     Head: Atraumatic.  Cardiovascular:     Rate and Rhythm: Normal rate.  Pulmonary:     Effort: Pulmonary effort is normal.  Abdominal:     General: There is distension.     Palpations: Abdomen is soft. There is fluid wave.     Tenderness: There is no abdominal tenderness.  Musculoskeletal:     Cervical back: Neck supple.  Skin:    General: Skin is warm.  Neurological:     Mental Status: He is alert and oriented to person, place, and time.     ED Results / Procedures / Treatments   Labs (all labs ordered are listed, but only abnormal results are displayed) Labs Reviewed - No data to display  EKG None  Radiology CT ABDOMEN PELVIS W CONTRAST Result Date: 01/09/2024 CLINICAL DATA:  History of colon carcinoma with abdominal bloating, initial encounter EXAM: CT ABDOMEN AND PELVIS WITH CONTRAST TECHNIQUE: Multidetector  CT imaging of the abdomen and pelvis was performed using the standard protocol following bolus administration of intravenous contrast. RADIATION DOSE REDUCTION: This exam was performed according to the departmental dose-optimization program which includes automated exposure control, adjustment of the mA and/or kV according to patient size and/or use of iterative reconstruction technique. CONTRAST:  OMNIPAQUE IOHEXOL 350 MG/ML SOLN COMPARISON:  PET-CT from 12/28/2023 FINDINGS: Lower chest: Lung bases demonstrate a few tiny nodules within the right lower lobe. These were shown to be non metabolic on recent PET-CT. No other focal abnormality is noted. Hepatobiliary: Liver again  demonstrates innumerable metastatic lesions throughout the liver. The overall appearance is similar to that seen on the prior PET-CT. Largest of these lesions again lies within the left lobe of the liver measuring up to 12 cm in AP dimension. This is slightly increased from a prior CT from 12/18/2023 at which time it measured 10.7 cm. The gallbladder is within normal limits. Pancreas: Unremarkable. No pancreatic ductal dilatation or surrounding inflammatory changes. Spleen: Normal in size without focal abnormality. Adrenals/Urinary Tract: Adrenal glands are within normal limits. The kidneys demonstrate a normal enhancement pattern bilaterally. No renal calculi or obstructive changes are seen. The bladder is decompressed. Stomach/Bowel: Colon is predominately decompressed. A mildly hyperdense lesion is noted within the cecum measuring up to 2.4 cm similar to that seen on recent PET-CT again likely representing the primary colonic neoplasm. Stomach is decompressed. Small bowel is within normal limits. Vascular/Lymphatic: Aortic atherosclerotic calcifications are noted. Scattered lymph nodes are again noted in the right lower quadrant consistent with metastatic adenopathy. Prominent lymph nodes are noted in the inguinal regions bilaterally worse on the left than the right again suggestive of metastatic disease. Stable left para-aortic adenopathy is noted. Reproductive: Prostate is unremarkable. Other: Mild free fluid is noted within the pelvis increased from previous exams. Musculoskeletal: Bony structures are within normal limits. IMPRESSION: Changes consistent with the known history of metastatic colon carcinoma with innumerable hepatic lesions. Slight increase in size is noted in the left lobe as described. Hyperdense lesion in the cecum which may represent the primary neoplasm. Scattered adenopathy is noted in the right lower quadrant, inguinal regions and left Peri aortic region stable in appearance from the  recent PET-CT. Mild ascites. Tiny nodules within the right lower lobe stable in appearance from the prior CT. These were not hypermetabolic on prior PET-CT but may be too small for adequate characterization. Monitoring on subsequent follow-up exams is recommended. Electronically Signed   By: Alcide Clever M.D.   On: 01/09/2024 19:24    Procedures Procedures    Medications Ordered in ED Medications  iohexol (OMNIPAQUE) 350 MG/ML injection 100 mL (100 mLs Intravenous Contrast Given 01/09/24 1546)    ED Course/ Medical Decision Making/ A&P                                 Medical Decision Making Amount and/or Complexity of Data Reviewed Radiology: ordered.  Risk Prescription drug management.  68 year old male with history of follicular lymphoma, recurrent, hypertension comes in with chief complaint of abdominal bloating and distention for the last several days.  I reviewed patient's records including notes from oncology and also reviewed previous CT scans, PET scans.  Appears that patient has some metastasis to liver.  Differential diagnosis for him includes malignant effusion, worsening cancer, paraneoplastic process.  I reviewed the labs from this morning.  No repeat labs indicated at  this time.  CMP was reassuring, bilirubin is normal.  CT ordered, results are reassuring.  Stable for discharge.  Final Clinical Impression(s) / ED Diagnoses Final diagnoses:  Metastatic malignant neoplasm, unspecified site [facility])  Other ascites    Rx / DC Orders ED Discharge Orders     None         Derwood Kaplan, MD 01/09/24 [phone]

## 2024-01-09 NOTE — Discharge Instructions (Signed)
 CT scan is completed, please follow-up with oncologist.

## 2024-01-09 NOTE — Progress Notes (Signed)
 Results sent through MyChart

## 2024-01-09 NOTE — ED Triage Notes (Signed)
 PT brought over from cancer center for weakness and abdominal distention,  Elevated LFTS and bilirubin at cancer center.  I L of fluid given by cancer center.  Port is accessed.

## 2024-01-09 NOTE — ED Notes (Signed)
 Pt unable to urinate at this time.

## 2024-01-09 NOTE — Patient Instructions (Signed)

## 2024-01-09 NOTE — Telephone Encounter (Signed)
 TCT patient about coming in today for labs and fluids, possible blood transfusion. Pt did not answer his phone. Called wife, Tyler Aas. Spoke with her. She states he is very weak, not eating much. Advised that she needs to bring him in to the cancer center to be seen in symptom management clinic for labs, fluids etc. She said she can get him here by 10:30 am. Georga Kaufmann, PA and [facility] aware.

## 2024-01-10 ENCOUNTER — Inpatient Hospital Stay: Payer: Medicare Other | Admitting: Physician Assistant

## 2024-01-10 ENCOUNTER — Inpatient Hospital Stay: Payer: Medicare Other | Attending: Hematology and Oncology

## 2024-01-10 ENCOUNTER — Inpatient Hospital Stay: Payer: Medicare Other

## 2024-01-10 ENCOUNTER — Other Ambulatory Visit: Payer: Self-pay | Admitting: *Deleted

## 2024-01-10 ENCOUNTER — Telehealth: Payer: Self-pay

## 2024-01-10 ENCOUNTER — Other Ambulatory Visit: Payer: Self-pay | Admitting: Hematology

## 2024-01-10 ENCOUNTER — Telehealth: Payer: Self-pay | Admitting: *Deleted

## 2024-01-10 VITALS — BP 117/85 | HR 94 | Temp 97.6°F | Resp 19 | Wt 238.6 lb

## 2024-01-10 VITALS — BP 130/91 | HR 85 | Temp 97.9°F | Resp 16

## 2024-01-10 DIAGNOSIS — C787 Secondary malignant neoplasm of liver and intrahepatic bile duct: Secondary | ICD-10-CM

## 2024-01-10 DIAGNOSIS — D649 Anemia, unspecified: Secondary | ICD-10-CM | POA: Diagnosis not present

## 2024-01-10 DIAGNOSIS — I517 Cardiomegaly: Secondary | ICD-10-CM | POA: Diagnosis not present

## 2024-01-10 DIAGNOSIS — C189 Malignant neoplasm of colon, unspecified: Secondary | ICD-10-CM

## 2024-01-10 DIAGNOSIS — C8228 Follicular lymphoma grade III, unspecified, lymph nodes of multiple sites: Secondary | ICD-10-CM | POA: Diagnosis present

## 2024-01-10 DIAGNOSIS — I2510 Atherosclerotic heart disease of native coronary artery without angina pectoris: Secondary | ICD-10-CM | POA: Diagnosis not present

## 2024-01-10 DIAGNOSIS — K4090 Unilateral inguinal hernia, without obstruction or gangrene, not specified as recurrent: Secondary | ICD-10-CM | POA: Diagnosis not present

## 2024-01-10 DIAGNOSIS — Z5111 Encounter for antineoplastic chemotherapy: Secondary | ICD-10-CM

## 2024-01-10 DIAGNOSIS — I700 Atherosclerosis of aorta: Secondary | ICD-10-CM | POA: Diagnosis not present

## 2024-01-10 DIAGNOSIS — Z95828 Presence of other vascular implants and grafts: Secondary | ICD-10-CM

## 2024-01-10 DIAGNOSIS — C8233 Follicular lymphoma grade IIIa, intra-abdominal lymph nodes: Secondary | ICD-10-CM

## 2024-01-10 DIAGNOSIS — E669 Obesity, unspecified: Secondary | ICD-10-CM | POA: Diagnosis not present

## 2024-01-10 DIAGNOSIS — C7A8 Other malignant neuroendocrine tumors: Secondary | ICD-10-CM | POA: Diagnosis not present

## 2024-01-10 DIAGNOSIS — Z79899 Other long term (current) drug therapy: Secondary | ICD-10-CM | POA: Diagnosis not present

## 2024-01-10 DIAGNOSIS — R188 Other ascites: Secondary | ICD-10-CM | POA: Diagnosis not present

## 2024-01-10 DIAGNOSIS — Z8249 Family history of ischemic heart disease and other diseases of the circulatory system: Secondary | ICD-10-CM | POA: Diagnosis not present

## 2024-01-10 DIAGNOSIS — Z9181 History of falling: Secondary | ICD-10-CM | POA: Diagnosis not present

## 2024-01-10 DIAGNOSIS — Z833 Family history of diabetes mellitus: Secondary | ICD-10-CM | POA: Diagnosis not present

## 2024-01-10 DIAGNOSIS — C180 Malignant neoplasm of cecum: Secondary | ICD-10-CM | POA: Diagnosis present

## 2024-01-10 DIAGNOSIS — Z9089 Acquired absence of other organs: Secondary | ICD-10-CM | POA: Diagnosis not present

## 2024-01-10 DIAGNOSIS — Z825 Family history of asthma and other chronic lower respiratory diseases: Secondary | ICD-10-CM | POA: Diagnosis not present

## 2024-01-10 DIAGNOSIS — G4700 Insomnia, unspecified: Secondary | ICD-10-CM | POA: Diagnosis not present

## 2024-01-10 DIAGNOSIS — Z801 Family history of malignant neoplasm of trachea, bronchus and lung: Secondary | ICD-10-CM | POA: Diagnosis not present

## 2024-01-10 DIAGNOSIS — Z7189 Other specified counseling: Secondary | ICD-10-CM | POA: Diagnosis not present

## 2024-01-10 LAB — CBC WITH DIFFERENTIAL (CANCER CENTER ONLY)
Abs Immature Granulocytes: 0.44 10*3/uL — ABNORMAL HIGH (ref 0.00–0.07)
Basophils Absolute: 0 10*3/uL (ref 0.0–0.1)
Basophils Relative: 0 %
Eosinophils Absolute: 0 10*3/uL (ref 0.0–0.5)
Eosinophils Relative: 0 %
HCT: 27.5 % — ABNORMAL LOW (ref 39.0–52.0)
Hemoglobin: 8.8 g/dL — ABNORMAL LOW (ref 13.0–17.0)
Immature Granulocytes: 3 %
Lymphocytes Relative: 7 %
Lymphs Abs: 1.1 10*3/uL (ref 0.7–4.0)
MCH: 24.9 pg — ABNORMAL LOW (ref 26.0–34.0)
MCHC: 32 g/dL (ref 30.0–36.0)
MCV: 77.7 fL — ABNORMAL LOW (ref 80.0–100.0)
Monocytes Absolute: 1 10*3/uL (ref 0.1–1.0)
Monocytes Relative: 7 %
Neutro Abs: 13 10*3/uL — ABNORMAL HIGH (ref 1.7–7.7)
Neutrophils Relative %: 83 %
Platelet Count: 537 10*3/uL — ABNORMAL HIGH (ref 150–400)
RBC: 3.54 MIL/uL — ABNORMAL LOW (ref 4.22–5.81)
RDW: 18.6 % — ABNORMAL HIGH (ref 11.5–15.5)
WBC Count: 15.6 10*3/uL — ABNORMAL HIGH (ref 4.0–10.5)
nRBC: 0.2 % (ref 0.0–0.2)

## 2024-01-10 LAB — CMP (CANCER CENTER ONLY)
ALT: 70 U/L — ABNORMAL HIGH (ref 0–44)
AST: 199 U/L (ref 15–41)
Albumin: 3.2 g/dL — ABNORMAL LOW (ref 3.5–5.0)
Alkaline Phosphatase: 962 U/L — ABNORMAL HIGH (ref 38–126)
Anion gap: 11 (ref 5–15)
BUN: 22 mg/dL (ref 8–23)
CO2: 25 mmol/L (ref 22–32)
Calcium: 8.5 mg/dL — ABNORMAL LOW (ref 8.9–10.3)
Chloride: 102 mmol/L (ref 98–111)
Creatinine: 1.24 mg/dL (ref 0.61–1.24)
GFR, Estimated: >60 mL/min (ref >60)
Glucose, Bld: 94 mg/dL (ref 70–99)
Potassium: 4.5 mmol/L (ref 3.5–5.1)
Sodium: 138 mmol/L (ref 135–145)
Total Bilirubin: 3.7 mg/dL (ref 0.0–1.2)
Total Protein: 6.2 g/dL — ABNORMAL LOW (ref 6.5–8.1)

## 2024-01-10 LAB — SAMPLE TO BLOOD BANK

## 2024-01-10 LAB — TOTAL PROTEIN, URINE DIPSTICK: Protein, ur: 100 mg/dL — AB

## 2024-01-10 MED ORDER — PROCHLORPERAZINE MALEATE 10 MG PO TABS: 10.0000 mg | ORAL_TABLET | Freq: Four times a day (QID) | ORAL | 1 refills | Status: DC | PRN

## 2024-01-10 MED ORDER — PALONOSETRON HCL INJECTION 0.25 MG/5ML
0.2500 mg | Freq: Once | INTRAVENOUS | Status: AC
Start: 2024-01-10 — End: 2024-01-10
  Administered 2024-01-10: 0.25 mg via INTRAVENOUS
  Filled 2024-01-10: qty 5

## 2024-01-10 MED ORDER — SODIUM CHLORIDE 0.9 % IV SOLN
1800.0000 mg/m2 | INTRAVENOUS | Status: DC
  Administered 2024-01-10: 4200 mg via INTRAVENOUS
  Filled 2024-01-10: qty 84

## 2024-01-10 MED ORDER — DEXTROSE 5 % IV SOLN
400.0000 mg/m2 | Freq: Once | INTRAVENOUS | Status: AC
  Administered 2024-01-10: 936 mg via INTRAVENOUS
  Filled 2024-01-10: qty 46.8

## 2024-01-10 MED ORDER — ONDANSETRON HCL 8 MG PO TABS: 8.0000 mg | ORAL_TABLET | Freq: Three times a day (TID) | ORAL | 1 refills | Status: DC | PRN

## 2024-01-10 MED ORDER — SODIUM CHLORIDE 0.9 % IV SOLN: INTRAVENOUS | Status: DC

## 2024-01-10 MED ORDER — LIDOCAINE-PRILOCAINE 2.5-2.5 % EX CREA: TOPICAL_CREAM | CUTANEOUS | 3 refills | Status: DC

## 2024-01-10 MED ORDER — DEXAMETHASONE SODIUM PHOSPHATE 10 MG/ML IJ SOLN
10.0000 mg | Freq: Once | INTRAMUSCULAR | Status: AC
  Administered 2024-01-10: 10 mg via INTRAVENOUS
  Filled 2024-01-10: qty 1

## 2024-01-10 MED ORDER — SODIUM CHLORIDE 0.9% FLUSH
10.0000 mL | Freq: Once | INTRAVENOUS | Status: AC
  Administered 2024-01-10: 10 mL

## 2024-01-10 NOTE — Progress Notes (Signed)
.      Latest Ref Rng & Units 01/10/2024   10:17 AM 01/09/2024   11:37 AM 01/05/2024    3:55 AM  CBC  WBC 4.0 - 10.5 K/uL 15.6  12.1  8.9   Hemoglobin 13.0 - 17.0 g/dL 8.8  8.9  8.0   Hematocrit 39.0 - 52.0 % 27.5  27.2  25.4   Platelets 150 - 400 K/uL 537  502  307    .    Latest Ref Rng & Units 01/10/2024   10:17 AM 01/09/2024   11:37 AM 01/05/2024    3:55 AM  CMP  Glucose 70 - 99 mg/dL 94  161  096   BUN 8 - 23 mg/dL 22  23  35   Creatinine 0.61 - 1.24 mg/dL 0.45  4.09  8.11   Sodium 135 - 145 mmol/L 138  138  133   Potassium 3.5 - 5.1 mmol/L 4.5  4.4  4.4   Chloride 98 - 111 mmol/L 102  102  102   CO2 22 - 32 mmol/L 25  24  21    Calcium 8.9 - 10.3 mg/dL 8.5  8.4  8.3   Total Protein 6.5 - 8.1 g/dL 6.2  6.2    Total Bilirubin 0.0 - 1.2 mg/dL 3.7  3.0    Alkaline Phos 38 - 126 U/L 962  1,049    AST 15 - 41 U/L 199  163    ALT 0 - 44 U/L 70  64     Patient with significantly compromised LFTs based on 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 He is keen to attempt palliative chemotherapy. Hold Avastin due to risk of bleeding from cecal tumor. Hold Oxaloplatin with 1st cycle  Hold 5 FU bolus dose with 1st cycles and proceed with 5FU infusion @ 1800mg /m2 due to decreased hepatic clearance. Dose can be readjusted up based on tolerance to 1st treatment. F/u in 1 week with labs for toxicity check. Discussed with Patient and Karena Addison

## 2024-01-10 NOTE — Telephone Encounter (Signed)
 CRITICAL VALUE STICKER  CRITICAL VALUE: AST 199; Total bilirubin 3.7  RECEIVER (on-site recipient of call):Sandi K, RN  DATE & TIME NOTIFIED: 01/10/24;1128  MESSENGER (representative from lab):Pam  MD NOTIFIED: Georga Kaufmann, PA  TIME OF NOTIFICATION:1129  RESPONSE: Information acknowledged - patient appt scheduled today

## 2024-01-10 NOTE — Telephone Encounter (Signed)
 [facility] Clinical Social Work  Clinical Social Work was referred by medical provider for assessment of psychosocial needs [facility]).  Clinical Social Worker contacted caregiver by phone to offer support and assess for needs. CSW set up appointment to discuss home health with patient and to assess needs. Patient has medicare which will impact availability and length of time with home health. CSW will collaborate with medical team as needed.   Marguerita Merles, LCSW  Clinical Social Worker [facility]

## 2024-01-10 NOTE — Patient Instructions (Signed)
 [facility] - A DEPT OF [facility]  Discharge Instructions: Thank you for choosing [facility] to provide your oncology and hematology care.   If you have a lab appointment with the Cancer Center, please go directly to the Cancer Center and check in at the registration area.   Wear comfortable clothing and clothing appropriate for easy access to any Portacath or PICC line.   We strive to give you quality time with your provider. You may need to reschedule your appointment if you arrive late (15 or more minutes).  Arriving late affects you and other patients whose appointments are after yours.  Also, if you miss three or more appointments without notifying the office, you may be dismissed from the clinic at the provider's discretion.      For prescription refill requests, have your pharmacy contact our office and allow 72 hours for refills to be completed.    Today you received the following chemotherapy and/or immunotherapy agents Leucovorin, 53fuFluorouracil Injection What is this medication? FLUOROURACIL (flure oh YOOR a sil) treats some types of cancer. It works by slowing down the growth of cancer cells. This medicine may be used for other purposes; ask your health care provider or pharmacist if you have questions. COMMON BRAND NAME(S): Adrucil What should I tell my care team before I take this medication? They need to know if you have any of these conditions: Blood disorders Dihydropyrimidine dehydrogenase (DPD) deficiency Infection, such as chickenpox, cold sores, herpes Kidney disease Liver disease Poor nutrition Recent or ongoing radiation therapy An unusual or allergic reaction to fluorouracil, other medications, foods, dyes, or preservatives If you or your partner are pregnant or trying to get pregnant Breast-feeding How should I use this medication? This medication is injected into a vein. It is administered by your care team in a  hospital or clinic setting. Talk to your care team about the use of this medication in children. Special care may be needed. Overdosage: If you think you have taken too much of this medicine contact a poison control center or emergency room at once. NOTE: This medicine is only for you. Do not share this medicine with others. What if I miss a dose? Keep appointments for follow-up doses. It is important not to miss your dose. Call your care team if you are unable to keep an appointment. What may interact with this medication? Do not take this medication with any of the following: Live virus vaccines This medication may also interact with the following: Medications that treat or prevent blood clots, such as warfarin, enoxaparin, dalteparin This list may not describe all possible interactions. Give your health care provider a list of all the medicines, herbs, non-prescription drugs, or dietary supplements you use. Also tell them if you smoke, drink alcohol, or use illegal drugs. Some items may interact with your medicine. What should I watch for while using this medication? Your condition will be monitored carefully while you are receiving this medication. This medication may make you feel generally unwell. This is not uncommon as chemotherapy can affect healthy cells as well as cancer cells. Report any side effects. Continue your course of treatment even though you feel ill unless your care team tells you to stop. In some cases, you may be given additional medications to help with side effects. Follow all directions for their use. This medication may increase your risk of getting an infection. Call your care team for advice if  you get a fever, chills, sore throat, or other symptoms of a cold or flu. Do not treat yourself. Try to avoid being around people who are sick. This medication may increase your risk to bruise or bleed. Call your care team if you notice any unusual bleeding. Be careful brushing  or flossing your teeth or using a toothpick because you may get an infection or bleed more easily. If you have any dental work done, tell your dentist you are receiving this medication. Avoid taking medications that contain aspirin, acetaminophen, ibuprofen, naproxen, or ketoprofen unless instructed by your care team. These medications may hide a fever. Do not treat diarrhea with over the counter products. Contact your care team if you have diarrhea that lasts more than 2 days or if it is severe and watery. This medication can make you more sensitive to the sun. Keep out of the sun. If you cannot avoid being in the sun, wear protective clothing and sunscreen. Do not use sun lamps, tanning beds, or tanning booths. Talk to your care team if you or your partner wish to become pregnant or think you might be pregnant. This medication can cause serious birth defects if taken during pregnancy and for 3 months after the last dose. A reliable form of contraception is recommended while taking this medication and for 3 months after the last dose. Talk to your care team about effective forms of contraception. Do not father a child while taking this medication and for 3 months after the last dose. Use a condom while having sex during this time period. Do not breastfeed while taking this medication. This medication may cause infertility. Talk to your care team if you are concerned about your fertility. What side effects may I notice from receiving this medication? Side effects that you should report to your care team as soon as possible: Allergic reactions--skin rash, itching, hives, swelling of the face, lips, tongue, or throat Heart attack--pain or tightness in the chest, shoulders, arms, or jaw, nausea, shortness of breath, cold or clammy skin, feeling faint or lightheaded Heart failure--shortness of breath, swelling of the ankles, feet, or hands, sudden weight gain, unusual weakness or fatigue Heart rhythm  changes--fast or irregular heartbeat, dizziness, feeling faint or lightheaded, chest pain, trouble breathing High ammonia level--unusual weakness or fatigue, confusion, loss of appetite, nausea, vomiting, seizures Infection--fever, chills, cough, sore throat, wounds that don't heal, pain or trouble when passing urine, general feeling of discomfort or being unwell Low red blood cell level--unusual weakness or fatigue, dizziness, headache, trouble breathing Pain, tingling, or numbness in the hands or feet, muscle weakness, change in vision, confusion or trouble speaking, loss of balance or coordination, trouble walking, seizures Redness, swelling, and blistering of the skin over hands and feet Severe or prolonged diarrhea Unusual bruising or bleeding Side effects that usually do not require medical attention (report to your care team if they continue or are bothersome): Dry skin Headache Increased tears Nausea Pain, redness, or swelling with sores inside the mouth or throat Sensitivity to light Vomiting This list may not describe all possible side effects. Call your doctor for medical advice about side effects. You may report side effects to FDA at 1-800-FDA-1088. Where should I keep my medication? This medication is given in a hospital or clinic. It will not be stored at home. NOTE: This sheet is a summary. It may not cover all possible information. If you have questions about this medicine, talk to your doctor, pharmacist, or health care provider.  2024 Elsevier/Gold Standard (2022-03-02 00:00:00)      To help prevent nausea and vomiting after your treatment, we encourage you to take your nausea medication as directed.  BELOW ARE SYMPTOMS THAT SHOULD BE REPORTED IMMEDIATELY: *FEVER GREATER THAN 100.4 F (38 C) OR HIGHER *CHILLS OR SWEATING *NAUSEA AND VOMITING THAT IS NOT CONTROLLED WITH YOUR NAUSEA MEDICATION *UNUSUAL SHORTNESS OF BREATH *UNUSUAL BRUISING OR BLEEDING *URINARY PROBLEMS  (pain or burning when urinating, or frequent urination) *BOWEL PROBLEMS (unusual diarrhea, constipation, pain near the anus) TENDERNESS IN MOUTH AND THROAT WITH OR WITHOUT PRESENCE OF ULCERS (sore throat, sores in mouth, or a toothache) UNUSUAL RASH, SWELLING OR PAIN  UNUSUAL VAGINAL DISCHARGE OR ITCHING   Items with * indicate a potential emergency and should be followed up as soon as possible or go to the Emergency Department if any problems should occur.  Please show the CHEMOTHERAPY ALERT CARD or IMMUNOTHERAPY ALERT CARD at check-in to the Emergency Department and triage nurse.  Should you have questions after your visit or need to cancel or reschedule your appointment, please contact [facility]  Dept: [phone]  and follow the prompts.  Office hours are 8:00 a.m. to 4:30 p.m. Monday - Friday. Please note that voicemails left after 4:00 p.m. may not be returned until the following business day.  We are closed weekends and major holidays. You have access to a nurse at all times for urgent questions. Please call the main number to the clinic Dept: [phone] and follow the prompts.   For any non-urgent questions, you may also contact your provider using MyChart. We now offer e-Visits for anyone 67 and older to request care online for non-urgent symptoms. For details visit [email or web address].   Also download the MyChart app! Go to the app store, search "MyChart", open the app, select [place], and log in with your MyChart username and password.

## 2024-01-11 ENCOUNTER — Telehealth: Payer: Self-pay

## 2024-01-11 ENCOUNTER — Inpatient Hospital Stay: Payer: Medicare Other | Admitting: Medical

## 2024-01-11 ENCOUNTER — Other Ambulatory Visit: Payer: Medicare Other

## 2024-01-11 NOTE — Telephone Encounter (Signed)
 [facility] Clinical Social Work  Clinical Social Work was referred by medical provider for assessment of psychosocial needs(Home Health). Clinical Social Worker contacted caregiver by phone to offer support and assess for needs. [name] provider reported assistance is needed based on diagnosis impacting ADLS.  Home Health  CSW discussed with patient the general process for obtaining home health services (Private Pay Vs Insurance). CSW described what home health services looks like for current insurance (skill based tasks with goal to get back to baseline). CSW informed patient the process of getting connected to medicare home health (orders sent by provider). CSW inquired if patient is medicaid eligible, attempted to get income amounts.   Patient spouse declined orders for PT evaluation being sent at this time. Patient spouse will call CSW when decision is made. Medical team updated.      Marguerita Merles, LCSW  Clinical Social Worker [facility]

## 2024-01-12 ENCOUNTER — Encounter: Payer: Self-pay | Admitting: *Deleted

## 2024-01-12 ENCOUNTER — Encounter: Payer: Self-pay | Admitting: Hematology and Oncology

## 2024-01-12 ENCOUNTER — Telehealth: Payer: Self-pay

## 2024-01-12 ENCOUNTER — Inpatient Hospital Stay: Payer: Medicare Other

## 2024-01-12 VITALS — BP 117/80 | HR 96 | Temp 97.6°F | Resp 20

## 2024-01-12 DIAGNOSIS — C8233 Follicular lymphoma grade IIIa, intra-abdominal lymph nodes: Secondary | ICD-10-CM

## 2024-01-12 DIAGNOSIS — Z95828 Presence of other vascular implants and grafts: Secondary | ICD-10-CM

## 2024-01-12 DIAGNOSIS — C8228 Follicular lymphoma grade III, unspecified, lymph nodes of multiple sites: Secondary | ICD-10-CM | POA: Diagnosis not present

## 2024-01-12 MED ORDER — SODIUM CHLORIDE 0.9% FLUSH
10.0000 mL | Freq: Once | INTRAVENOUS | Status: AC
Start: 2024-01-12 — End: 2024-01-12
  Administered 2024-01-12: 10 mL

## 2024-01-12 MED ORDER — HEPARIN SOD (PORK) LOCK FLUSH 100 UNIT/ML IV SOLN
500.0000 [IU] | Freq: Once | INTRAVENOUS | Status: AC
Start: 2024-01-12 — End: 2024-01-12
  Administered 2024-01-12: 500 [IU]

## 2024-01-12 NOTE — Progress Notes (Signed)
 Patient here for pump D/C.  C/O headache due to lack of sleep, edema in legs and feet, SOB, and dry hives.  Secure chatted [employer].  She came over to assess patient.  Patent taken to med onc via Beth/RN

## 2024-01-12 NOTE — Progress Notes (Addendum)
 [facility] [phone]   [phone]  PROGRESS NOTE  Patient Care Team: Tysinger, Kermit Balo, PA-C as PCP - General (Family Medicine) Jaci Standard, MD as Consulting Physician (Hematology and Oncology)  Hematological/Oncological History # Follicular Lymphoma. Stage III 1) 01/07/2020: CT Neck W contrast showed cervical lymphadenopathy bilaterally, left greater than right. Numerous enlarged lymph nodes are present 2)  01/22/2020: [place] Core biopsy performed which revealed overall features consistent with involvement by non-Hodgkin's B-cell lymphoma and the morphologic and phenotypic findings favor follicular lymphoma.  3) 01/31/2020: establish care with Dr. Leonides Schanz  4) 02/12/2020: PET CT scan demonstrates bulky adenopathy in the neck, chest, abdomen and pelvis and diffuse skeletal uptake is suspicious for (but not diagnostic) of marrow involvement. 5) 04/30/2020: Cycle 1 Day 1 of Bendamustine/Rituximab 6) 05/29/2020: Cycle 2 Day 1 of Bendamustine/Rituximab 7) 06/25/2020: Cycle 3 Day 1 of Bendamustine/Rituximab 8) 07/23/2020: Cycle 4 Day 1 of Bendamustine/Rituximab 9) 08/25/2020: Cycle 5 Day 1 of Bendamustine/Rituximab 10)09/17/2020: Cycle 6 Day 1 of Bendamustine/Rituximab 11) 10/26/2020: PET CT scan shows partial response (Deauville Score 3 in lymph nodes but some residual FDG avidity in the bone marrow).  12) 04/27/2021: CT scan showed interval decreased adenopathy below the diaphragm, consistent with treatment response. No new or enlarging suspicious lymph nodes 13) 10/27/2021: CT scan showed increased left inguinal adenopathy, including a 1.6 cm left inguinal lymph node which was previously 0.9 cm in short axis. 14) 04/30/2022: CT scan shows progressive adenopathy below the diaphragm with new thoracic adenopathy and increased hazy retroperitoneal stranding, consistent with worsening lymphomatous disease. 15) 05/05/2022: Doppler US of lower extremities revealed bilateral  lymph nodes in the groin.   16) 06/10/2022: Echo: EF 60-65% 17) 06/29/2022: Cycle 1 of R-CHOP 18) 07/26/2022: Cycle 2 of R-CHOP 19) 08/17/2022: Cycle 3 of R-CHOP 20) 09/07/2022: Cycle 4 of R-CHOP 21) 09/29/2022: Cycle 5 of R-CHOP 22) 10/27/2022: Cycle 6 of R-CHOP  # Well Differentiated Neuroendocrine Tumor 1) 03/08/2023: Routine colonoscopy performed and patient was found to have a well-differentiated neuroendocrine tumor in the ileum.  Tumor was 0.5 cm with a Ki-67 of less than 1%  #Metastatic cecal adenocarcinoma 1) 12/05/2023: CT CAP: Multiple new hepatic lesions highly suggestive for metastatic disease.Subcentimeter filling defect in the proximal right portal vein may represent artifact, nonocclusive thrombus, or tumor invasion.Overall stable multi station lymphadenopathy as described. 2) 12/20/2023: Core needle liver biopsy confirmed metastatic adenocarcinoma favoring gastrointestinal tract primary.  3) 12/28/2023: PET/CT showed hypermetabolic cecal mass concerning for primary colorectal carcinoma, innumerable hypermetabolic liver lesions consistent with hepatic metastasis, hypermetabolic lymph nodes adjacent to the cecum and in the ileocecal mesentery consistent with nodal metastasis, hypermetabolic lymph nodes in the LEFT inguinal nodal station consistent with nodal metastasis. 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 4) 01/11/2024: Cycle 1, Day 1 of infusional 5FU (hold oxaliplatin due to elevated LFTs and holding bevacizumab due to bleeding tumor)  Interval History:  [name] 68 y.o. male with history of stage III follicular lymphoma, well differentiated neuroendocrine tumor in the ileum, and now newly diagnosed metastatic cecal adenocarcinoma  presents for a follow up visit. He presents today to start Cycle 1, Day 1 of FOLFOX chemotherapy.   On exam today [name] reports energy levels are  poor and he requires a wheelchair to help with ambulation. He has a poor appetite and only eating liquids due to early satiety. He denies nausea, vomiting or  abdominal pain. He does have liquid stools at this time. He denies easy bruising or signs of active bleeding. He has persistent shortness of breath with minimal exertion.  He denies fevers, chills, sweats, chest pain or cough. A full 10 point ROS was otherwise negative.    MEDICAL HISTORY:  Past Medical History:  Diagnosis Date   Cancer (HCC) 2021   follicular lymphoma   Obesity     SURGICAL HISTORY: Past Surgical History:  Procedure Laterality Date   ANKLE FRACTURE SURGERY  2016   right, trauma repair after fall down stairs   HERNIA REPAIR     umbilical   IR IMAGING GUIDED PORT INSERTION  04/09/2020   IR IMAGING GUIDED PORT INSERTION  06/17/2022   IR REMOVAL TUN ACCESS W/ PORT W/O FL MOD SED  11/27/2020   TONSILLECTOMY      SOCIAL HISTORY: Social History   Socioeconomic History   Marital status: Married    Spouse name: Not on file   Number of children: Not on file   Years of education: Not on file   Highest education level: Not on file  Occupational History   Not on file  Tobacco Use   Smoking status: Never   Smokeless tobacco: Never  Vaping Use   Vaping status: Never Used  Substance and Sexual Activity   Alcohol use: Not Currently    Alcohol/week: 3.0 standard drinks of alcohol    Types: 3 Shots of liquor per week   Drug use: Not Currently   Sexual activity: Not Currently  Other Topics Concern   Not on file  Social History Narrative   Engaged, plan to marry in March 2021, been together since 2014.  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    [employer].   Most exercise on the job, working 7 days per week.   No children, but fiance has 5 children.    11/2019   Social Drivers of Health   Financial Resource Strain: Not on file  Food Insecurity: No Food Insecurity (01/06/2024)   Hunger Vital Sign    Worried About  Running Out of Food in the Last Year: Never true    Ran Out of Food in the Last Year: Never true  Transportation Needs: No Transportation Needs (01/06/2024)   PRAPARE - Administrator, Civil Service (Medical): No    Lack of Transportation (Non-Medical): No  Physical Activity: Not on file  Stress: Not on file  Social Connections: Socially Isolated (01/03/2024)   Social Connection and Isolation Panel [NHANES]    Frequency of Communication with Friends and Family: Never    Frequency of Social Gatherings with Friends and Family: Never    Attends Religious Services: Never    Database administrator or Organizations: No    Attends Banker Meetings: Never    Marital Status: Married  Catering manager Violence: Not At Risk (01/06/2024)   Humiliation, Afraid, Rape, and Kick questionnaire    Fear of Current or Ex-Partner: No    Emotionally Abused: No    Physically Abused: No    Sexually Abused: No    FAMILY HISTORY: Family History  Problem Relation Age of Onset   Cancer Mother        lung; former smoker   COPD Father        emphysema   Diabetes Maternal Aunt    Heart disease Maternal Uncle    Heart disease Maternal Uncle    Diabetes Maternal Aunt  Stroke Neg Hx    Hyperlipidemia Neg Hx    Hypertension Neg Hx     ALLERGIES:  has no known allergies.  MEDICATIONS:  Current Outpatient Medications  Medication Sig Dispense Refill   acetaminophen (TYLENOL) 500 MG tablet Take 2 tablets (1,000 mg total) by mouth every 6 (six) hours as needed. 30 tablet 0   benzonatate (TESSALON) 200 MG capsule Take 1 capsule (200 mg total) by mouth 3 (three) times daily as needed for cough. 30 capsule 0   oxyCODONE (OXY IR/ROXICODONE) 5 MG immediate release tablet Take 1 tablet (5 mg total) by mouth every 4 (four) hours as needed for severe pain (pain score 7-10). 60 tablet 0   predniSONE (DELTASONE) 20 MG tablet 3 tablets today, 2 tablets tomorrow, 1 tablet the third day 6 tablet 0    lidocaine-prilocaine (EMLA) cream Apply to affected area once 30 g 3   omeprazole (PRILOSEC) 40 MG capsule Take 40 mg by mouth daily. (Patient not taking: Reported on 01/02/2024)     ondansetron (ZOFRAN) 8 MG tablet Take 1 tablet (8 mg total) by mouth every 8 (eight) hours as needed for nausea or vomiting. Start on the third day after chemotherapy. 30 tablet 1   prochlorperazine (COMPAZINE) 10 MG tablet Take 1 tablet (10 mg total) by mouth every 6 (six) hours as needed for nausea or vomiting. 30 tablet 1   valsartan (DIOVAN) 40 MG tablet Take 1 tablet (40 mg total) by mouth daily. (Patient not taking: Reported on 01/02/2024) 30 tablet 2   No current facility-administered medications for this visit.    REVIEW OF SYSTEMS:   Constitutional: ( - ) fevers, ( - )  chills , ( - ) night sweats Eyes: ( - ) blurriness of vision, ( - ) double vision, ( - ) watery eyes Ears, nose, mouth, throat, and face: ( - ) mucositis, ( - ) sore throat Respiratory: ( - ) cough, ( - ) dyspnea, ( - ) wheezes Cardiovascular: ( - ) palpitation, ( - ) chest discomfort, ( - ) lower extremity swelling Gastrointestinal:  ( - ) nausea, ( - ) heartburn, ( - ) change in bowel habits Skin: ( - ) abnormal skin rashes Lymphatics: ( - ) new lymphadenopathy, ( - ) easy bruising Neurological: ( - ) numbness, ( - ) tingling, ( - ) new weaknesses Behavioral/Psych: ( - ) mood change, ( - ) new changes  All other systems were reviewed with the patient and are negative.  PHYSICAL EXAMINATION: ECOG PERFORMANCE STATUS: 1 - Symptomatic but completely ambulatory  Vitals:   01/10/24 1221  BP: 117/85  Pulse: 94  Resp: 19  Temp: 97.6 F (36.4 C)  SpO2: 99%     Filed Weights   01/10/24 1221  Weight: 238 lb 9.6 oz (108.2 kg)      GENERAL: ill appearing middle aged Caucasian male in NAD  SKIN: texture, turgor are normal, no rashes or significant lesions. Jaundice.  EYES: conjunctiva are pink and non-injected, sclera  icteric LUNGS: clear to auscultation and percussion with normal breathing effort HEART: regular rate & rhythm and no murmurs and +1 pitting edema in lower extremity bilaterally.  ABDOMEN: abdomen is diffusely distended.  Musculoskeletal: no cyanosis of digits and no clubbing  PSYCH: alert & oriented x 3, fluent speech NEURO: no focal motor/sensory deficits  LABORATORY DATA:  I have reviewed the data as listed    Latest Ref Rng & Units 01/10/2024   10:17 AM 01/09/2024  11:37 AM 01/05/2024    3:55 AM  CBC  WBC 4.0 - 10.5 K/uL 15.6  12.1  8.9   Hemoglobin 13.0 - 17.0 g/dL 8.8  8.9  8.0   Hematocrit 39.0 - 52.0 % 27.5  27.2  25.4   Platelets 150 - 400 K/uL 537  502  307        Latest Ref Rng & Units 01/10/2024   10:17 AM 01/09/2024   11:37 AM 01/05/2024    3:55 AM  CMP  Glucose 70 - 99 mg/dL 94  409  811   BUN 8 - 23 mg/dL 22  23  35   Creatinine 0.61 - 1.24 mg/dL 9.14  7.82  9.56   Sodium 135 - 145 mmol/L 138  138  133   Potassium 3.5 - 5.1 mmol/L 4.5  4.4  4.4   Chloride 98 - 111 mmol/L 102  102  102   CO2 22 - 32 mmol/L 25  24  21    Calcium 8.9 - 10.3 mg/dL 8.5  8.4  8.3   Total Protein 6.5 - 8.1 g/dL 6.2  6.2    Total Bilirubin 0.0 - 1.2 mg/dL 3.7  3.0    Alkaline Phos 38 - 126 U/L 962  1,049    AST 15 - 41 U/L 199  163    ALT 0 - 44 U/L 70  64     RADIOGRAPHIC STUDIES: I have personally reviewed the radiological images as listed and agreed with the findings in the report: Marked response in the patient's lymphadenopathy and decrease in his FDG avidity.  Bone marrow involvement also appears considerably less.  Overall consistent with a partial response.  CT ABDOMEN PELVIS W CONTRAST Result Date: 01/09/2024 CLINICAL DATA:  History of colon carcinoma with abdominal bloating, initial encounter EXAM: CT ABDOMEN AND PELVIS WITH CONTRAST TECHNIQUE: Multidetector CT imaging of the abdomen and pelvis was performed using the standard protocol following bolus administration of intravenous  contrast. RADIATION DOSE REDUCTION: This exam was performed according to the departmental dose-optimization program which includes automated exposure control, adjustment of the mA and/or kV according to patient size and/or use of iterative reconstruction technique. CONTRAST:  OMNIPAQUE IOHEXOL 350 MG/ML SOLN COMPARISON:  PET-CT from 12/28/2023 FINDINGS: Lower chest: Lung bases demonstrate a few tiny nodules within the right lower lobe. These were shown to be non metabolic on recent PET-CT. No other focal abnormality is noted. Hepatobiliary: Liver again demonstrates innumerable metastatic lesions throughout the liver. The overall appearance is similar to that seen on the prior PET-CT. Largest of these lesions again lies within the left lobe of the liver measuring up to 12 cm in AP dimension. This is slightly increased from a prior CT from 12/18/2023 at which time it measured 10.7 cm. The gallbladder is within normal limits. Pancreas: Unremarkable. No pancreatic ductal dilatation or surrounding inflammatory changes. Spleen: Normal in size without focal abnormality. Adrenals/Urinary Tract: Adrenal glands are within normal limits. The kidneys demonstrate a normal enhancement pattern bilaterally. No renal calculi or obstructive changes are seen. The bladder is decompressed. Stomach/Bowel: Colon is predominately decompressed. A mildly hyperdense lesion is noted within the cecum measuring up to 2.4 cm similar to that seen on recent PET-CT again likely representing the primary colonic neoplasm. Stomach is decompressed. Small bowel is within normal limits. Vascular/Lymphatic: Aortic atherosclerotic calcifications are noted. Scattered lymph nodes are again noted in the right lower quadrant consistent with metastatic adenopathy. Prominent lymph nodes are noted in the inguinal regions  bilaterally worse on the left than the right again suggestive of metastatic disease. Stable left para-aortic adenopathy is noted.  Reproductive: Prostate is unremarkable. Other: Mild free fluid is noted within the pelvis increased from previous exams. Musculoskeletal: Bony structures are within normal limits. IMPRESSION: Changes consistent with the known history of metastatic colon carcinoma with innumerable hepatic lesions. Slight increase in size is noted in the left lobe as described. Hyperdense lesion in the cecum which may represent the primary neoplasm. Scattered adenopathy is noted in the right lower quadrant, inguinal regions and left Peri aortic region stable in appearance from the recent PET-CT. Mild ascites. Tiny nodules within the right lower lobe stable in appearance from the prior CT. These were not hypermetabolic on prior PET-CT but may be too small for adequate characterization. Monitoring on subsequent follow-up exams is recommended. Electronically Signed   By: Alcide Clever M.D.   On: 01/09/2024 19:24   DG Chest Port 1 View Result Date: 01/02/2024 CLINICAL DATA:  Sepsis.  Fever. EXAM: PORTABLE CHEST 1 VIEW COMPARISON:  12/17/2023 FINDINGS: The right IJ Port-A-Cath is stable The cardiac silhouette, mediastinal and hilar contours are within normal limits. The lungs are clear of an acute process. No infiltrates or effusions. No pulmonary lesions. The bony thorax is intact. IMPRESSION: No acute cardiopulmonary findings. Electronically Signed   By: Rudie Meyer M.D.   On: 01/02/2024 21:48   NM PET Image Initial (PI) Skull Base To Thigh (F-18 FDG) Result Date: 12/29/2023 CLINICAL DATA:  Subsequent treatment strategy for colorectal carcinoma. History of lymphoma. EXAM: NUCLEAR MEDICINE PET SKULL BASE TO THIGH TECHNIQUE: 11.9 mCi F-18 FDG was injected intravenously. Full-ring PET imaging was performed from the skull base to thigh after the radiotracer. CT data was obtained and used for attenuation correction and anatomic localization. Fasting blood glucose: 90 mg/dl COMPARISON:  CT 16/08/9603, DOTATATE PET-CT 04/21/2023  FINDINGS: NECK: focal metabolic lesion in the RIGHT parotid gland is likely a primary parotid neoplasm. Incidental CT findings: None. CHEST: No hypermetabolic mediastinal or hilar nodes. No suspicious pulmonary nodules on the CT scan. Incidental CT findings: Port in the anterior chest wall with tip in distal SVC. ABDOMEN/PELVIS: Innumerable round intensely hypermetabolic lesions throughout the LEFT and RIGHT hepatic lobes. These correspond to the hypoenhancing lesion on comparison CT. Lesions are intense. For example RIGHT hepatic lobe lesion with SUV max equal 14.2. LEFT hepatic lobe lesion with SUV max equal 14.4 There is hypermetabolic activity in the cecum on image 167 with SUV max equal 14.8. There is a hypermetabolic lymph node adjacent to the cecum along the medial border measuring 14 mm on image 163 with SUV max equal 6.7. Smaller hypermetabolic lymph node in the ileocecal mesentery on image 157 measures 9 mm with SUV max equal 5.8. There is a cluster of hypermetabolic lymph nodes in the LEFT inguinal nodal station with SUV max equal 7.2 on image 203. Incidental CT findings: None. SKELETON: No focal hypermetabolic activity to suggest skeletal metastasis. Incidental CT findings: None. IMPRESSION: 1. Innumerable hypermetabolic liver lesions consistent with hepatic metastasis. 2. Hypermetabolic cecal mass concerning for primary colorectal carcinoma. 3. Hypermetabolic lymph nodes adjacent to the cecum and in the ileocecal mesentery consistent with nodal metastasis. 4. Hypermetabolic lymph nodes in the LEFT inguinal nodal station consistent with nodal metastasis. 5. No evidence of metastatic adenopathy in the chest. 6. 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 Electronically Signed   By: Genevive Bi M.D.   On: 12/29/2023 10:24  US BIOPSY (LIVER) Result Date: 12/22/2023 INDICATION: History of follicular lymphoma with development of multiple liver lesions. EXAM:  ULTRASOUND GUIDED CORE BIOPSY OF LIVER MASS MEDICATIONS: None. ANESTHESIA/SEDATION: Moderate (conscious) sedation was employed during this procedure. A total of Versed 1.0 mg and Fentanyl 50 mcg was administered intravenously. Moderate Sedation Time: 16 minutes. The patient's level of consciousness and vital signs were monitored continuously by radiology nursing throughout the procedure under my direct supervision. PROCEDURE: The procedure, risks, benefits, and alternatives were explained to the patient. Questions regarding the procedure were encouraged and answered. The patient understands and consents to the procedure. A time-out was performed prior to initiating the procedure. The abdominal wall was prepped with chlorhexidine in a sterile fashion, and a sterile drape was applied covering the operative field. A sterile gown and sterile gloves were used for the procedure. Local anesthesia was provided with 1% Lidocaine. Ultrasound was performed to localize liver lesions. A lesion in the left lobe of the liver was chosen for biopsy. Under CT guidance, a 17 gauge trocar needle was advanced into the lesion. After confirming needle tip position, coaxial 18 gauge core biopsy samples were obtained. A total of 5 samples were submitted in both saline and formalin. The outer needle was removed and additional ultrasound performed. COMPLICATIONS: None immediate. FINDINGS: Rounded mass within the left lobe of the liver measures up to 5.7 cm in maximum diameter. Solid tissue was obtained. IMPRESSION: Ultrasound-guided core biopsy performed of a mass in the left lobe of the liver measuring up to 5.7 cm in maximum diameter. Electronically Signed   By: Irish Lack M.D.   On: 12/22/2023 09:40   CT ABDOMEN PELVIS W CONTRAST Result Date: 12/18/2023 CLINICAL DATA:  Abdominal pain, acute, nonlocalized. Shortness of breath. EXAM: CT ABDOMEN AND PELVIS WITH CONTRAST TECHNIQUE: Multidetector CT imaging of the abdomen and pelvis was  performed using the standard protocol following bolus administration of intravenous contrast. RADIATION DOSE REDUCTION: This exam was performed according to the departmental dose-optimization program which includes automated exposure control, adjustment of the mA and/or kV according to patient size and/or use of iterative reconstruction technique. CONTRAST:  OMNIPAQUE IOHEXOL 350 MG/ML SOLN COMPARISON:  12/05/2023 FINDINGS: Lower chest: No acute abnormality Hepatobiliary: Numerous hepatic lesions are again noted similar prior study compatible with metastases. Gallbladder unremarkable. Pancreas: No focal abnormality or ductal dilatation. Spleen: No focal abnormality.  Normal size. Adrenals/Urinary Tract: No adrenal abnormality. No focal renal abnormality. No stones or hydronephrosis. Urinary bladder is unremarkable. Stomach/Bowel: Stomach, large and small bowel grossly unremarkable. Normal appendix. Vascular/Lymphatic: Aortic atherosclerosis. No aneurysm. Multiple enlarged retroperitoneal, inguinal and central mesenteric lymph nodes, stable since prior study. Reproductive: No visible focal abnormality. Other: No free fluid or free air. Small left inguinal hernia containing fat. Musculoskeletal: No acute bony abnormality. IMPRESSION: Continued numerous large masses throughout the liver compatible with metastases, unchanged since recent study. Unchanged multi station lymphadenopathy. Aortic atherosclerosis. Electronically Signed   By: Charlett Nose M.D.   On: 12/18/2023 00:57   CT Angio Chest PE W/Cm &/Or Wo Cm Result Date: 12/18/2023 CLINICAL DATA:  Pulmonary embolism (PE) suspected, low to intermediate prob, positive D-dimer. Shortness of breath EXAM: CT ANGIOGRAPHY CHEST WITH CONTRAST TECHNIQUE: Multidetector CT imaging of the chest was performed using the standard protocol during bolus administration of intravenous contrast. Multiplanar CT image reconstructions and MIPs were obtained to evaluate the vascular  anatomy. RADIATION DOSE REDUCTION: This exam was performed according to the departmental dose-optimization program which includes automated exposure control, adjustment  of the mA and/or kV according to patient size and/or use of iterative reconstruction technique. CONTRAST:  OMNIPAQUE IOHEXOL 350 MG/ML SOLN COMPARISON:  12/05/2023 FINDINGS: Cardiovascular: No filling defects in the pulmonary arteries to suggest pulmonary emboli. Heart mildly enlarged. Scattered coronary artery and aortic atherosclerosis. Mediastinum/Nodes: No mediastinal, hilar, or axillary adenopathy. Trachea and esophagus are unremarkable. Thyroid unremarkable. Lungs/Pleura: Lungs are clear. No focal airspace opacities or suspicious nodules. No effusions. Upper Abdomen: See abdominal CT report Musculoskeletal: Chest wall soft tissues are unremarkable. No acute bony abnormality. Review of the MIP images confirms the above findings. IMPRESSION: No evidence of pulmonary embolus. No acute cardiopulmonary disease. Coronary artery disease. Aortic Atherosclerosis (ICD10-I70.0). Electronically Signed   By: Charlett Nose M.D.   On: 12/18/2023 00:54   DG Chest Port 1 View Result Date: 12/17/2023 CLINICAL DATA:  Worsening shortness of breath. EXAM: PORTABLE CHEST 1 VIEW COMPARISON:  April 21, 2022 FINDINGS: A right-sided venous Port-A-Cath is in place, with its distal tip seen at the junction of the superior vena cava and right atrium. The heart size and mediastinal contours are within normal limits. Both lungs are clear. The visualized skeletal structures are unremarkable. IMPRESSION: No active disease. Electronically Signed   By: Aram Candela M.D.   On: 12/17/2023 22:22    ASSESSMENT & PLAN LAMARCO GUDIEL is a 68 y.o. male with history of stage III follicular lymphoma, well differentiated neuroendocrine tumor in the ileum, and now newly diagnosed metastatic cecal adenocarcinoma who presents for a follow up visit.  # Metastatic Colon  Cancer  -- Prior CT findings showed concern for metastatic disease to the liver.  Biopsy showed metastatic colon cancer. -- PET CT scan ordered and did confirm large cecal mass, most consistent with primary colon cancer. -- Discussed with gastroenterology.  Will hold off on colonoscopy at this time as we have tissue for NGS testing.  In the event we do not have adequate tissue for testing would recommend colonoscopy. -- Recommended systemic FOLFOX chemotherapy. PLAN: --Due to start Cycle 1, Day 1 of FOLFOX. --Labs from today reviewed with patient. WBC 15.6, Hgb 8.8, MCV 77.7, Plt 537, Creatinine normal, AST 199, ALT 0, Alk phos 962, Tbili 3.7. --Due to elevated liver enzymes and hyperbilirubinemia, discussed with Dr. Candise Che who recommend to hold oxaliplatin and proceed with single agent infusional 5FU dose reduced to 1800 mg/m2. Bevacizumab on hold due to risk of bleeding from cecal tumor.  --Discussed risks of proceeding with chemotherapy with current PS and LFTs. Patient is agreeable to move forward with treatment.  --Plan to return in one week for a toxicity check and repeat labs.   #Weight loss/early satiety: --Likely secondary to compression for hepatomegaly.  --Encouraged small frequent meals and supplement with protein shakes --Referral sent to nutritional for further evaluation  #Anemia #Dark Stools -- Most likely to be connected to his apparent progression of disease, though GI bleed cannot be ruled out given his dark stools. -- Upper endoscopy did not show any evidence of GI bleed.  Suspect bleeding is coming from the patient's cecal mass. --Hgb is 8.8 today. No need for a blood transfusion at this time.   # Follicular Lymphoma. Stage III --patient completed Cycle 6 of R-Benda therapy. This was administered with curative intent.  --PET CT scan after 6 cycles of R-Benda showed substantial partial response. Residual mild hypermetabolism within the retroperitoneal and left common iliac  chains (Deauville score 3). No residual metabolically active adenopathy in the neck or chest --per  Lugano Criteria, patient is a Partial Response.  --CT neck and CAP from 04/30/2022 showed progressive adenopathy.  --Doppler  US from 05/05/2022 showed progressive inguinal adenopathy, bilateral. --Recommend systemic therapy with R-CHOP started on 06/29/2022 ----Baseline ECHO from 06/10/2022 shows EF 60-65% --patient completed 6 cycles of R-CHOP  # Well Differentiated Neuroendocrine Tumor -- Continue to monitor in conjunction with his follicular lymphoma. -- No elevations in chromogranin or evidence of more disease on PET dotatate  # Left Lower Leg Swelling, improved --Patient has no more redness and +1 pitting edema of left lower extremity. Swelling L>R --Doppler US from 05/05/2022 showed no evidence of DVT. There are enlarged lymph nodes noted in the groin, bilaterally which is likely causing the swelling.   #Supportive Care -- port placed -- zofran 8mg  q8H PRN and compazine 10mg  PO q6H for nausea -- EMLA cream for port -- no pain medication required at this time.   No orders of the defined types were placed in this encounter.   All questions were answered. The patient knows to call the clinic with any problems, questions or concerns.  I have spent a total of 30 minutes minutes of face-to-face and non-face-to-face time, preparing to see the patient,  performing a medically appropriate examination, counseling and educating the patient, ordering medications, documenting clinical information in the electronic health record, and care coordination.   Georga Kaufmann PA-C Dept of Hematology and Oncology [facility] at [facility] Phone: [phone]   01/12/2024 12:34 PM  ADDENDUM    .     Latest Ref Rng & Units 01/10/2024   10:17 AM 01/09/2024   11:37 AM 01/05/2024    3:55 AM  CBC  WBC 4.0 - 10.5 K/uL 15.6  12.1  8.9   Hemoglobin 13.0 - 17.0 g/dL 8.8  8.9  8.0    Hematocrit 39.0 - 52.0 % 27.5  27.2  25.4   Platelets 150 - 400 K/uL 537  502  307     .     Latest Ref Rng & Units 01/10/2024   10:17 AM 01/09/2024   11:37 AM 01/05/2024    3:55 AM  CMP  Glucose 70 - 99 mg/dL 94  098  119   BUN 8 - 23 mg/dL 22  23  35   Creatinine 0.61 - 1.24 mg/dL 1.47  8.29  5.62   Sodium 135 - 145 mmol/L 138  138  133   Potassium 3.5 - 5.1 mmol/L 4.5  4.4  4.4   Chloride 98 - 111 mmol/L 102  102  102   CO2 22 - 32 mmol/L 25  24  21    Calcium 8.9 - 10.3 mg/dL 8.5  8.4  8.3   Total Protein 6.5 - 8.1 g/dL 6.2  6.2     Total Bilirubin 0.0 - 1.2 mg/dL 3.7  3.0     Alkaline Phos 38 - 126 U/L 962  1,049     AST 15 - 41 U/L 199  163     ALT 0 - 44 U/L 70  64       Patient with significantly compromised LFTs based on 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 He is keen to attempt palliative chemotherapy. Hold Avastin due to risk of bleeding from cecal tumor. Hold Oxaloplatin with 1st cycle  Hold 5 FU bolus dose with 1st cycles and proceed with 5FU infusion @ 1800mg /m2 due to decreased hepatic clearance. Dose can be readjusted up based on tolerance to  1st treatment. F/u in 1 week with labs for toxicity check. Discussed with Patient and Karena Addison

## 2024-01-12 NOTE — Telephone Encounter (Signed)
 Mr. Temme states that he is holding steady. Continues with abdominal distension. He is drinking very small amounts of fluid. He is drinking some protein shakes. He can't wait for the pump to come off this afternoon.  He states that he is done with My Chart. He can not deal with all the messages that he receives in a day. He is just deleting them at this point. He does follow the calendar that he receives from the schedulers. He knows to call the office at [phone] if he has any questions or concerns.

## 2024-01-12 NOTE — Telephone Encounter (Signed)
-----   Message from Nurse Kasandra Knudsen sent at 01/10/2024  3:01 PM EST ----- Regarding: First time 5FU-Dorsey First time 5FU. Patient only received Leucovorin/5FU for first cycle. Tolerated well.

## 2024-01-14 NOTE — Addendum Note (Signed)
 Addended by: Wyvonnia Lora on: 01/14/2024 01:05 AM   Modules accepted: Level of Service

## 2024-01-16 ENCOUNTER — Other Ambulatory Visit: Payer: Medicare Other

## 2024-01-16 ENCOUNTER — Inpatient Hospital Stay (HOSPITAL_BASED_OUTPATIENT_CLINIC_OR_DEPARTMENT_OTHER): Admitting: Physician Assistant

## 2024-01-16 ENCOUNTER — Telehealth: Payer: Self-pay | Admitting: *Deleted

## 2024-01-16 ENCOUNTER — Inpatient Hospital Stay (HOSPITAL_BASED_OUTPATIENT_CLINIC_OR_DEPARTMENT_OTHER)

## 2024-01-16 ENCOUNTER — Inpatient Hospital Stay: Payer: Medicare Other

## 2024-01-16 ENCOUNTER — Other Ambulatory Visit: Payer: Self-pay | Admitting: *Deleted

## 2024-01-16 ENCOUNTER — Inpatient Hospital Stay

## 2024-01-16 ENCOUNTER — Other Ambulatory Visit: Payer: Self-pay

## 2024-01-16 VITALS — BP 111/70 | HR 99 | Temp 98.0°F | Resp 22

## 2024-01-16 VITALS — BP 125/72 | HR 86 | Temp 98.0°F | Resp 20 | Wt 233.3 lb

## 2024-01-16 DIAGNOSIS — R17 Unspecified jaundice: Secondary | ICD-10-CM

## 2024-01-16 DIAGNOSIS — Z7189 Other specified counseling: Secondary | ICD-10-CM

## 2024-01-16 DIAGNOSIS — C189 Malignant neoplasm of colon, unspecified: Secondary | ICD-10-CM

## 2024-01-16 DIAGNOSIS — C8228 Follicular lymphoma grade III, unspecified, lymph nodes of multiple sites: Secondary | ICD-10-CM | POA: Diagnosis not present

## 2024-01-16 DIAGNOSIS — R112 Nausea with vomiting, unspecified: Secondary | ICD-10-CM

## 2024-01-16 DIAGNOSIS — C787 Secondary malignant neoplasm of liver and intrahepatic bile duct: Secondary | ICD-10-CM | POA: Diagnosis not present

## 2024-01-16 DIAGNOSIS — R601 Generalized edema: Secondary | ICD-10-CM

## 2024-01-16 DIAGNOSIS — C8233 Follicular lymphoma grade IIIa, intra-abdominal lymph nodes: Secondary | ICD-10-CM

## 2024-01-16 DIAGNOSIS — Z95828 Presence of other vascular implants and grafts: Secondary | ICD-10-CM

## 2024-01-16 LAB — CBC WITH DIFFERENTIAL (CANCER CENTER ONLY)
Abs Immature Granulocytes: 0.58 10*3/uL — ABNORMAL HIGH (ref 0.00–0.07)
Basophils Absolute: 0 10*3/uL (ref 0.0–0.1)
Basophils Relative: 0 %
Eosinophils Absolute: 0 10*3/uL (ref 0.0–0.5)
Eosinophils Relative: 0 %
HCT: 25.4 % — ABNORMAL LOW (ref 39.0–52.0)
Hemoglobin: 8.5 g/dL — ABNORMAL LOW (ref 13.0–17.0)
Immature Granulocytes: 4 %
Lymphocytes Relative: 7 %
Lymphs Abs: 1 10*3/uL (ref 0.7–4.0)
MCH: 25.6 pg — ABNORMAL LOW (ref 26.0–34.0)
MCHC: 33.5 g/dL (ref 30.0–36.0)
MCV: 76.5 fL — ABNORMAL LOW (ref 80.0–100.0)
Monocytes Absolute: 0.5 10*3/uL (ref 0.1–1.0)
Monocytes Relative: 3 %
Neutro Abs: 12.1 10*3/uL — ABNORMAL HIGH (ref 1.7–7.7)
Neutrophils Relative %: 86 %
Platelet Count: 468 10*3/uL — ABNORMAL HIGH (ref 150–400)
RBC: 3.32 MIL/uL — ABNORMAL LOW (ref 4.22–5.81)
RDW: 20.5 % — ABNORMAL HIGH (ref 11.5–15.5)
WBC Count: 14.2 10*3/uL — ABNORMAL HIGH (ref 4.0–10.5)
nRBC: 0.7 % — ABNORMAL HIGH (ref 0.0–0.2)

## 2024-01-16 LAB — CMP (CANCER CENTER ONLY)
ALT: 70 U/L — ABNORMAL HIGH (ref 0–44)
AST: 123 U/L — ABNORMAL HIGH (ref 15–41)
Albumin: 3 g/dL — ABNORMAL LOW (ref 3.5–5.0)
Alkaline Phosphatase: 643 U/L — ABNORMAL HIGH (ref 38–126)
Anion gap: 10 (ref 5–15)
BUN: 35 mg/dL — ABNORMAL HIGH (ref 8–23)
CO2: 25 mmol/L (ref 22–32)
Calcium: 8.2 mg/dL — ABNORMAL LOW (ref 8.9–10.3)
Chloride: 100 mmol/L (ref 98–111)
Creatinine: 1.38 mg/dL — ABNORMAL HIGH (ref 0.61–1.24)
GFR, Estimated: 55 mL/min — ABNORMAL LOW (ref >60)
Glucose, Bld: 106 mg/dL — ABNORMAL HIGH (ref 70–99)
Potassium: 4.3 mmol/L (ref 3.5–5.1)
Sodium: 135 mmol/L (ref 135–145)
Total Bilirubin: 9.1 mg/dL (ref 0.0–1.2)
Total Protein: 5.6 g/dL — ABNORMAL LOW (ref 6.5–8.1)

## 2024-01-16 LAB — MAGNESIUM: Magnesium: 2.1 mg/dL (ref 1.7–2.4)

## 2024-01-16 LAB — SAMPLE TO BLOOD BANK

## 2024-01-16 MED ORDER — HEPARIN SOD (PORK) LOCK FLUSH 100 UNIT/ML IV SOLN
500.0000 [IU] | Freq: Once | INTRAVENOUS | Status: AC
  Administered 2024-01-16: 500 [IU]

## 2024-01-16 MED ORDER — LORAZEPAM 1 MG PO TABS: 1.0000 mg | ORAL_TABLET | Freq: Every day | ORAL | 0 refills | Status: AC

## 2024-01-16 MED ORDER — SODIUM CHLORIDE 0.9% FLUSH
10.0000 mL | Freq: Once | INTRAVENOUS | Status: AC
  Administered 2024-01-16: 10 mL

## 2024-01-16 MED ORDER — ONDANSETRON HCL 4 MG/2ML IJ SOLN
8.0000 mg | Freq: Once | INTRAMUSCULAR | Status: AC
  Administered 2024-01-16: 8 mg via INTRAVENOUS
  Filled 2024-01-16: qty 4

## 2024-01-16 MED ORDER — SODIUM CHLORIDE 0.9 % IV SOLN: Freq: Once | INTRAVENOUS | Status: AC

## 2024-01-16 NOTE — Progress Notes (Signed)
 CRITICAL VALUE STICKER  CRITICAL VALUE: Total bilirubin: 9.1  RECEIVER (on-site recipient of call): Jarome Lamas, RN   DATE & TIME NOTIFIED: 01/16/2024 @ 1208  MESSENGER (representative from lab): Herbert Seta  MD NOTIFIED: Daphane Shepherd, PA-C and Dr. Candise Che  TIME OF NOTIFICATION: 01/16/24 @ 1211  RESPONSE: Providers evaluated patient in [facility]. Discussed GOC and further treatment options.

## 2024-01-16 NOTE — Telephone Encounter (Signed)
 Medication Prior Authorization Status  Processed CoverMyMeds KEY: [id number]   Sent Today as urgent request  Per [facility] [phone] opt 5) Case ID: [id number]   Effective pending through pending.  24 hour response expected.

## 2024-01-16 NOTE — Telephone Encounter (Signed)
 Received call from pt's wife. She states he is not doing well. He has been vomiting all weekend, not able to eat though trying to drink some fluids. She states he is more yellow, very weak. Advised that we can see him in [facility] today for labs and IV fluids. Advised to come in as soon as they can this morning. Jae Dire, [place] in [facility] aware as well as Georga Kaufmann, PA Will discuss the situation with Dr. Candise Che  today as Dr. Leonides Schanz is out of the office today.

## 2024-01-16 NOTE — Progress Notes (Unsigned)
 Symptom Management Consult Note [facility]    Patient Care Team: Tysinger, Kermit Balo, PA-C as PCP - General (Family Medicine) Jaci Standard, MD as Consulting Physician (Hematology and Oncology)    Name / MRN / DOB: Jorge Gould  [id number]  Sep 28, 1956   Date of visit: 01/16/2024   Chief Complaint/Reason for visit: nausea and vomiting   Current Therapy: FOLFOX  Last treatment:  5FU only Day 1   Cycle 1 on 01/10/24   ASSESSMENT & PLAN: Patient is a 68 y.o. male with oncologic history of follicular lymphoma (2021), neuroendocrine tumor (2024) and recent diagnosis of metastatic colon followed by Dr. Leonides Schanz.  I have viewed most recent oncology note and lab work.    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 He is keen to attempt palliative chemotherapy. Hold Avastin due to risk of bleeding from cecal tumor. Hold Oxaloplatin with 1st cycle. Hold 5 FU bolus dose with 1st cycles and proceed with 5FU infusion @ 1800mg /m2 due to decreased hepatic clearance. - Shared visit with Dr. Candise Che for goals of care discussion. Patient is agreeable to learning more about hospice and that he cannot tolerate further chemotherapy. He requests to discuss this with Dr. Leonides Schanz tomorrow when he returns to clinic, appointment made by RN.  #Nausea and vomiting - Persistent since first treatment. Patient received IV zofran in clinic for symptom management. Tolerating PO fluids on reassessment. - Patient with worsening jaundice and anasarca since last visit  x 1 week ago. Abdomen is non tender. - CMP today showing BUN/Creatinine 35/1.38 (up from 22/1.24), t. Bili 9.1 (up from 3.7). Liver enzymes remain elevated compared to last week as well AST 123 and ALT 70.   #Insomnia - Concern for developing  encephalopathy with worsening liver and renal function raising concern for hepatorenal syndrome.  - Patient strongly considering hospice pending follow up discussion with oncologist. - Prescription sent to pharmacy for 5 days of ativan to be taken at bedtime. Patient aware symptoms can be better managed once enrolle din hospice.    Strict ED precautions discussed should symptoms worsen.   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)  03/28/2020 Initial Diagnosis   Follicular lymphoma of intra-abdominal lymph nodes (HCC)   04/30/2020 - 09/18/2020 Chemotherapy   Patient is on Treatment Plan : NON-HODGKINS LYMPHOMA Rituximab D1 / Bendamustine D1,2 q28d     06/29/2022 - 07/01/2022 Chemotherapy   Patient is on Treatment Plan : NON-HODGKINS LYMPHOMA R-CHOP q21d     06/29/2022 - 10/30/2022 Chemotherapy   Patient is on Treatment Plan : NON-HODGKINS LYMPHOMA R-CHOP q21d     Colon cancer metastasized to liver (HCC)  12/30/2023 Initial Diagnosis   Colon cancer metastasized to liver (HCC)   01/10/2024 -  Chemotherapy   Patient is on Treatment Plan : COLORECTAL FOLFOX + Bevacizumab q14d         Interval history-: Discussed the use of AI scribe software for clinical note transcription with the patient, who gave verbal consent to proceed.   Jorge Gould is a 68 y.o. male with oncologic history as above presenting to [facility] today with chief complaint of nausea and vomiting. Patient is accompanied by his spouse who provides additional history.  Had dose reduced first treatment of FOLFOX last week. He has been  doing poorly overall. He continues to experience difficulty eating primarily due to bloating, which discourages him from wanting to eat. He is able to consume small amounts of food such as blueberries and peaches and drinks about four to five bottles of water daily, totaling approximately 20-24 ounces. He has not had a bowel movement in two to four  days and is not passing gas. No abdominal pain or fever. He states his abdomen is still distended, no significant change since last visit.  He had a significant episode of vomiting x 2 days ago, with 20 to 30 episodes over several hours, despite having nothing in his stomach. There has been no vomiting since then, although he occasionally spits up. He notes swelling in his feet, which causes discomfort, but he has not noticed swelling in his thighs or genital area. He also experiences shortness of breath, which has not worsened recently. He feels better when sitting up.  He experiences significant sleep disturbances, stating 'I just can't go to sleep.' He reports no issues with lying flat or in different positions but finds himself sitting up frequently at night. He is not currently on steroids. He has been awake for the last 4 days at least.   ROS  All other systems are reviewed and are negative for acute change except as noted in the HPI.    No Known Allergies   Past Medical History:  Diagnosis Date   Cancer (HCC) 2021   follicular lymphoma   Obesity      Past Surgical History:  Procedure Laterality Date   ANKLE FRACTURE SURGERY  2016   right, trauma repair after fall down stairs   HERNIA REPAIR     umbilical   IR IMAGING GUIDED PORT INSERTION  04/09/2020   IR IMAGING GUIDED PORT INSERTION  06/17/2022   IR REMOVAL TUN ACCESS W/ PORT W/O FL MOD SED  11/27/2020   TONSILLECTOMY      Social History   Socioeconomic History   Marital status: Married    Spouse name: Not on file   Number of children: Not on file   Years of education: Not on file   Highest education level: Not on file  Occupational History   Not on file  Tobacco Use   Smoking status: Never   Smokeless tobacco: Never  Vaping Use   Vaping status: Never Used  Substance and Sexual Activity   Alcohol use: Not Currently    Alcohol/week: 3.0 standard drinks of alcohol    Types: 3 Shots of liquor per week   Drug  use: Not Currently   Sexual activity: Not Currently  Other Topics Concern   Not on file  Social History Narrative   Engaged, plan to marry in March 2021, been together since 2014.  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    [employer].   Most exercise on the job, working 7 days per week.   No children, but fiance has 5 children.    11/2019   Social Drivers of Health   Financial Resource Strain: Not on file  Food Insecurity: No Food Insecurity (01/06/2024)   Hunger Vital Sign    Worried About Running Out of Food in the Last Year: Never true    Ran Out of Food in the Last Year: Never true  Transportation Needs: No Transportation Needs (01/06/2024)   PRAPARE - Administrator, Civil Service (Medical): No    Lack of Transportation (Non-Medical): No  Physical Activity: Not on file  Stress: Not on file  Social Connections: Socially Isolated (01/03/2024)   Social Connection and Isolation Panel [NHANES]    Frequency of Communication with Friends and Family: Never    Frequency of Social Gatherings with Friends and Family: Never    Attends Religious Services: Never    Database administrator or Organizations: No    Attends Banker Meetings: Never    Marital Status: Married  Catering manager Violence: Not At Risk (01/06/2024)   Humiliation, Afraid, Rape, and Kick questionnaire    Fear of Current or Ex-Partner: No    Emotionally Abused: No    Physically Abused: No    Sexually Abused: No    Family History  Problem Relation Age of Onset   Cancer Mother        lung; former smoker   COPD Father        emphysema   Diabetes Maternal Aunt    Heart disease Maternal Uncle    Heart disease Maternal Uncle    Diabetes Maternal Aunt    Stroke Neg Hx    Hyperlipidemia Neg Hx    Hypertension Neg Hx      Current Outpatient Medications:    LORazepam (ATIVAN) 1 MG tablet, Take 1 tablet (1 mg total) by mouth at bedtime for 5 days., Disp: 5 tablet, Rfl: 0   acetaminophen  (TYLENOL) 500 MG tablet, Take 2 tablets (1,000 mg total) by mouth every 6 (six) hours as needed., Disp: 30 tablet, Rfl: 0   benzonatate (TESSALON) 200 MG capsule, Take 1 capsule (200 mg total) by mouth 3 (three) times daily as needed for cough., Disp: 30 capsule, Rfl: 0   lidocaine-prilocaine (EMLA) cream, Apply to affected area once, Disp: 30 g, Rfl: 3   omeprazole (PRILOSEC) 40 MG capsule, Take 40 mg by mouth daily. (Patient not taking: Reported on 01/02/2024), Disp: , Rfl:    ondansetron (ZOFRAN) 8 MG tablet, Take 1 tablet (8 mg total) by mouth every 8 (eight) hours as needed for nausea or vomiting. Start on the third day after chemotherapy., Disp: 30 tablet, Rfl: 1   oxyCODONE (OXY IR/ROXICODONE) 5 MG immediate release tablet, Take 1 tablet (5 mg total) by mouth every 4 (four) hours as needed for severe pain (pain score 7-10)., Disp: 60 tablet, Rfl: 0   predniSONE (DELTASONE) 20 MG tablet, 3 tablets today, 2 tablets tomorrow, 1 tablet the third day, Disp: 6 tablet, Rfl: 0   prochlorperazine (COMPAZINE) 10 MG tablet, Take 1 tablet (10 mg total) by mouth every 6 (six) hours as needed for nausea or vomiting., Disp: 30 tablet, Rfl: 1   valsartan (DIOVAN) 40 MG tablet, Take 1 tablet (40 mg total) by mouth daily. (Patient not taking: Reported on 01/02/2024), Disp: 30 tablet, Rfl: 2  PHYSICAL EXAM: ECOG FS:3 - Symptomatic, >50% confined to bed    Vitals:   01/16/24 1001  BP: 111/70  Pulse: 99  Resp: (!) 22  Temp: 98 F (36.7 C)  TempSrc: Temporal  SpO2: 97%   Physical Exam Vitals and nursing note reviewed.  Constitutional:      Appearance: He is not ill-appearing or toxic-appearing.  HENT:     Head: Normocephalic.  Eyes:     General: Scleral icterus present.     Conjunctiva/sclera: Conjunctivae normal.  Cardiovascular:     Rate and Rhythm: Normal rate and regular rhythm.     Pulses: Normal pulses.     Heart sounds: Normal heart sounds.  Pulmonary:  Effort: Pulmonary effort is  normal.     Breath sounds: Normal breath sounds.  Abdominal:     General: There is distension.     Comments: Hypoactive bowel sounds.  Fluid wave.  Musculoskeletal:     Cervical back: Normal range of motion.     Right lower leg: 1+ Pitting Edema present.     Left lower leg: 1+ Pitting Edema present.  Skin:    General: Skin is warm and dry.     Coloration: Skin is jaundiced.  Neurological:     Mental Status: He is alert.  Psychiatric:        Mood and Affect: Mood is anxious. Affect is tearful.        LABORATORY DATA: I have reviewed the data as listed    Latest Ref Rng & Units 01/16/2024   11:00 AM 01/10/2024   10:17 AM 01/09/2024   11:37 AM  CBC  WBC 4.0 - 10.5 K/uL 14.2  15.6  12.1   Hemoglobin 13.0 - 17.0 g/dL 8.5  8.8  8.9   Hematocrit 39.0 - 52.0 % 25.4  27.5  27.2   Platelets 150 - 400 K/uL 468  537  502         Latest Ref Rng & Units 01/16/2024   11:00 AM 01/10/2024   10:17 AM 01/09/2024   11:37 AM  CMP  Glucose 70 - 99 mg/dL 161  94  096   BUN 8 - 23 mg/dL 35  22  23   Creatinine 0.61 - 1.24 mg/dL 0.45  4.09  8.11   Sodium 135 - 145 mmol/L 135  138  138   Potassium 3.5 - 5.1 mmol/L 4.3  4.5  4.4   Chloride 98 - 111 mmol/L 100  102  102   CO2 22 - 32 mmol/L 25  25  24    Calcium 8.9 - 10.3 mg/dL 8.2  8.5  8.4   Total Protein 6.5 - 8.1 g/dL 5.6  6.2  6.2   Total Bilirubin 0.0 - 1.2 mg/dL 9.1  3.7  3.0   Alkaline Phos 38 - 126 U/L 643  962  1,049   AST 15 - 41 U/L 123  199  163   ALT 0 - 44 U/L 70  70  64        RADIOGRAPHIC STUDIES (from last 24 hours if applicable) I have personally reviewed the radiological images as listed and agreed with the findings in the report. No results found.      Visit Diagnosis: 1. Colon cancer metastasized to liver (HCC)   2. Elevated bilirubin   3. Goals of care, counseling/discussion   4. Anasarca   5. Nausea and vomiting, unspecified vomiting type      No orders of the defined types were placed in this  encounter.   All questions were answered. The patient knows to call the clinic with any problems, questions or concerns. No barriers to learning was detected.  A total of more than 30 minutes were spent on this encounter with face-to-face time and non-face-to-face time, including preparing to see the patient, ordering tests and/or medications, counseling the patient and coordination of care as outlined above.    Thank you for allowing me to participate in the care of this patient.    Shanon Ace, PA-C Department of Hematology/Oncology [facility] Phone: [phone]  [phone]    01/16/2024 9:51 PM   ADDENDUM  .Patient  was Personally and independently interviewed, examined and relevant elements of the history of present illness were reviewed in details and an assessment and plan was created. All elements of the patient's history of present illness , assessment and plan were discussed in details with Shanon Ace, PA-C. The above documentation reflects our combined findings assessment and plan.   Had a detailed goals of care discussion with patient and his wife in the context of progressive deterioration of liver function from metastatic liver involvement from his colon cancer. Had attempted dose reduce 5FU infusion on insistence from patient but his bilirubin has bumped from 3.7 to 9. Given his ECOG PS 3-4, worsening liver function, severely compromised nutritional status and increasing symptom burden with concerns for early hepatic encephalopathy, potential hepato-renal syndrome, increasing anasarca and ascites strongly recommended consideration of best supportive care through hospice.  He will talk to his primary oncologist Dr Leonides Schanz tomorrow and make a final decision.   Wyvonnia Lora MD MS

## 2024-01-16 NOTE — Progress Notes (Signed)
 [facility] CSW Progress Note  Visual merchandiser met with patient and spouse at the request of the patient's nurse for additional support. Patient spouse's reported concerns with patient's appetite and physical strength that has reportedly decreased since diagnosis / treatment. [name] spouse expressed desire to have goals of care conversation with medical provider that entails current status of dx and if treatment is working. CSW walked patient through what a goals of care conversation may entail and the purpose of the conversation. [name] spouse stated importance of understanding if treatment is or will work, and improve his ability to eat / function. Patient expressed desire to continue treatment due to the possibility of living and reported understanding that if he does not receive treatment, his prognosis is fair. CSW will defer the remainder of the conversation to medical provider for said conversation due to necessity of having labs completed. CSW discussed present needs, as patient and family are concerned about fiances due to the impact of treatment on working. Patient is not eligible for SNAP / Medicaid due to asset eligibility. CSW will follow up with patient and spouse following medical provider appointment for next steps and continued support.   Marguerita Merles, LCSW Clinical Social Worker [facility]

## 2024-01-17 ENCOUNTER — Inpatient Hospital Stay

## 2024-01-17 ENCOUNTER — Encounter: Payer: Self-pay | Admitting: *Deleted

## 2024-01-17 ENCOUNTER — Inpatient Hospital Stay: Admitting: Hematology and Oncology

## 2024-01-17 ENCOUNTER — Other Ambulatory Visit: Payer: Self-pay | Admitting: Hematology and Oncology

## 2024-01-17 ENCOUNTER — Telehealth: Payer: Self-pay | Admitting: *Deleted

## 2024-01-17 VITALS — BP 116/78 | HR 98 | Temp 97.7°F | Resp 16 | Wt 233.0 lb

## 2024-01-17 DIAGNOSIS — C787 Secondary malignant neoplasm of liver and intrahepatic bile duct: Secondary | ICD-10-CM

## 2024-01-17 DIAGNOSIS — Z95828 Presence of other vascular implants and grafts: Secondary | ICD-10-CM

## 2024-01-17 DIAGNOSIS — C189 Malignant neoplasm of colon, unspecified: Secondary | ICD-10-CM

## 2024-01-17 DIAGNOSIS — R17 Unspecified jaundice: Secondary | ICD-10-CM | POA: Diagnosis not present

## 2024-01-17 DIAGNOSIS — C8233 Follicular lymphoma grade IIIa, intra-abdominal lymph nodes: Secondary | ICD-10-CM

## 2024-01-17 DIAGNOSIS — Z7189 Other specified counseling: Secondary | ICD-10-CM | POA: Diagnosis not present

## 2024-01-17 DIAGNOSIS — C8228 Follicular lymphoma grade III, unspecified, lymph nodes of multiple sites: Secondary | ICD-10-CM | POA: Diagnosis not present

## 2024-01-17 LAB — CMP (CANCER CENTER ONLY)
ALT: 64 U/L — ABNORMAL HIGH (ref 0–44)
AST: 104 U/L — ABNORMAL HIGH (ref 15–41)
Albumin: 3.1 g/dL — ABNORMAL LOW (ref 3.5–5.0)
Alkaline Phosphatase: 625 U/L — ABNORMAL HIGH (ref 38–126)
Anion gap: 11 (ref 5–15)
BUN: 36 mg/dL — ABNORMAL HIGH (ref 8–23)
CO2: 24 mmol/L (ref 22–32)
Calcium: 8.1 mg/dL — ABNORMAL LOW (ref 8.9–10.3)
Chloride: 101 mmol/L (ref 98–111)
Creatinine: 1.6 mg/dL — ABNORMAL HIGH (ref 0.61–1.24)
GFR, Estimated: 46 mL/min — ABNORMAL LOW (ref >60)
Glucose, Bld: 110 mg/dL — ABNORMAL HIGH (ref 70–99)
Potassium: 4.2 mmol/L (ref 3.5–5.1)
Sodium: 136 mmol/L (ref 135–145)
Total Bilirubin: 8.5 mg/dL (ref 0.0–1.2)
Total Protein: 5.6 g/dL — ABNORMAL LOW (ref 6.5–8.1)

## 2024-01-17 LAB — CBC WITH DIFFERENTIAL (CANCER CENTER ONLY)
Abs Immature Granulocytes: 0.82 10*3/uL — ABNORMAL HIGH (ref 0.00–0.07)
Basophils Absolute: 0.1 10*3/uL (ref 0.0–0.1)
Basophils Relative: 1 %
Eosinophils Absolute: 0 10*3/uL (ref 0.0–0.5)
Eosinophils Relative: 0 %
HCT: 25.4 % — ABNORMAL LOW (ref 39.0–52.0)
Hemoglobin: 8.4 g/dL — ABNORMAL LOW (ref 13.0–17.0)
Immature Granulocytes: 5 %
Lymphocytes Relative: 5 %
Lymphs Abs: 0.8 10*3/uL (ref 0.7–4.0)
MCH: 25.3 pg — ABNORMAL LOW (ref 26.0–34.0)
MCHC: 33.1 g/dL (ref 30.0–36.0)
MCV: 76.5 fL — ABNORMAL LOW (ref 80.0–100.0)
Monocytes Absolute: 0.9 10*3/uL (ref 0.1–1.0)
Monocytes Relative: 6 %
Neutro Abs: 12.7 10*3/uL — ABNORMAL HIGH (ref 1.7–7.7)
Neutrophils Relative %: 83 %
Platelet Count: 435 10*3/uL — ABNORMAL HIGH (ref 150–400)
RBC: 3.32 MIL/uL — ABNORMAL LOW (ref 4.22–5.81)
RDW: 21.8 % — ABNORMAL HIGH (ref 11.5–15.5)
WBC Count: 15.3 10*3/uL — ABNORMAL HIGH (ref 4.0–10.5)
nRBC: 0.5 % — ABNORMAL HIGH (ref 0.0–0.2)

## 2024-01-17 LAB — LACTATE DEHYDROGENASE: LDH: 2196 U/L — ABNORMAL HIGH (ref 98–192)

## 2024-01-17 LAB — CEA (ACCESS): CEA (CHCC): 939.97 ng/mL — ABNORMAL HIGH (ref 0.00–5.00)

## 2024-01-17 MED ORDER — SODIUM CHLORIDE 0.9 % IV SOLN: Freq: Once | INTRAVENOUS | Status: AC

## 2024-01-17 MED ORDER — SODIUM CHLORIDE 0.9% FLUSH
10.0000 mL | Freq: Once | INTRAVENOUS | Status: AC
  Administered 2024-01-17: 10 mL

## 2024-01-17 MED ORDER — HEPARIN SOD (PORK) LOCK FLUSH 100 UNIT/ML IV SOLN
500.0000 [IU] | Freq: Once | INTRAVENOUS | Status: AC
  Administered 2024-01-17: 500 [IU]

## 2024-01-17 NOTE — Telephone Encounter (Signed)
 Contacted by Mindi Junker with [facility] lab. [name] Total Bilirubin 8.5. MD aware. No new orders

## 2024-01-17 NOTE — Progress Notes (Signed)
 [facility] [phone]   [phone]  PROGRESS NOTE  Patient Care Team: Tysinger, Kermit Balo, PA-C as PCP - General (Family Medicine) Jaci Standard, MD as Consulting Physician (Hematology and Oncology)  Hematological/Oncological History # Follicular Lymphoma. Stage III 1) 01/07/2020: CT Neck W contrast showed cervical lymphadenopathy bilaterally, left greater than right. Numerous enlarged lymph nodes are present 2)  01/22/2020: [place] Core biopsy performed which revealed overall features consistent with involvement by non-Hodgkin's B-cell lymphoma and the morphologic and phenotypic findings favor follicular lymphoma.  3) 01/31/2020: establish care with Dr. Leonides Schanz  4) 02/12/2020: PET CT scan demonstrates bulky adenopathy in the neck, chest, abdomen and pelvis and diffuse skeletal uptake is suspicious for (but not diagnostic) of marrow involvement. 5) 04/30/2020: Cycle 1 Day 1 of Bendamustine/Rituximab 6) 05/29/2020: Cycle 2 Day 1 of Bendamustine/Rituximab 7) 06/25/2020: Cycle 3 Day 1 of Bendamustine/Rituximab 8) 07/23/2020: Cycle 4 Day 1 of Bendamustine/Rituximab 9) 08/25/2020: Cycle 5 Day 1 of Bendamustine/Rituximab 10)09/17/2020: Cycle 6 Day 1 of Bendamustine/Rituximab 11) 10/26/2020: PET CT scan shows partial response (Deauville Score 3 in lymph nodes but some residual FDG avidity in the bone marrow).  12) 04/27/2021: CT scan showed interval decreased adenopathy below the diaphragm, consistent with treatment response. No new or enlarging suspicious lymph nodes 13) 10/27/2021: CT scan showed increased left inguinal adenopathy, including a 1.6 cm left inguinal lymph node which was previously 0.9 cm in short axis. 14) 04/30/2022: CT scan shows progressive adenopathy below the diaphragm with new thoracic adenopathy and increased hazy retroperitoneal stranding, consistent with worsening lymphomatous disease. 15) 05/05/2022: Doppler US of lower extremities revealed bilateral  lymph nodes in the groin.   16) 06/10/2022: Echo: EF 60-65% 17) 06/29/2022: Cycle 1 of R-CHOP 18) 07/26/2022: Cycle 2 of R-CHOP 19) 08/17/2022: Cycle 3 of R-CHOP 20) 09/07/2022: Cycle 4 of R-CHOP 21) 09/29/2022: Cycle 5 of R-CHOP 22) 10/27/2022: Cycle 6 of R-CHOP  # Well Differentiated Neuroendocrine Tumor 03/08/2023: Routine colonoscopy performed and patient was found to have a well-differentiated neuroendocrine tumor in the ileum.  Tumor was 0.5 cm with a Ki-67 of less than 1%  Interval History:  Jorge Gould 68 y.o. male with medical history significant for Stage III follicular lymphoma who presents for a follow up visit. The patient's last visit was on 01/10/2024. In the interim since the last visit he had his first round of chemotherapy without oxaliplatin.  On exam today Mr. Chestang reports he is becoming more jaundiced and having difficulty keeping down food.  He is able to take in fluid but no solid food.  He did have a bowel movement yesterday.  He reports his urine has been red/orange.  He notes that he is not currently in pain but only feels distention and discomfort in his abdomen.  He has difficulty getting comfortable in his chair.  He is having nausea and vomiting and the lorazepam is helping him.  He reports that he had a good discussion with our team when I was out last week and that he understands that comfort based care is his best option moving forward.  He denies any fevers, chills, sweats.  Overall he has no signs or symptoms concerning for progression of his lymphoma.  A full 10 point ROS was otherwise negative.   The bulk of our discussion focused on his current condition is does moving forward.  At this time it appears his liver function is worsening rapidly and he is approaching liver failure.  He is  a lot of distention and discomfort and his blood counts are dropping.  At this time the best option is comfort based care as I am concerned he would not be able to tolerate further  chemotherapy.  He voices understanding of our findings and was willing and able to transition to comfort based care/hospice. MEDICAL HISTORY:  Past Medical History:  Diagnosis Date   Cancer (HCC) 2021   follicular lymphoma   Obesity     SURGICAL HISTORY: Past Surgical History:  Procedure Laterality Date   ANKLE FRACTURE SURGERY  2016   right, trauma repair after fall down stairs   HERNIA REPAIR     umbilical   IR IMAGING GUIDED PORT INSERTION  04/09/2020   IR IMAGING GUIDED PORT INSERTION  06/17/2022   IR REMOVAL TUN ACCESS W/ PORT W/O FL MOD SED  11/27/2020   TONSILLECTOMY      SOCIAL HISTORY: Social History   Socioeconomic History   Marital status: Married    Spouse name: Not on file   Number of children: Not on file   Years of education: Not on file   Highest education level: Not on file  Occupational History   Not on file  Tobacco Use   Smoking status: Never   Smokeless tobacco: Never  Vaping Use   Vaping status: Never Used  Substance and Sexual Activity   Alcohol use: Not Currently    Alcohol/week: 3.0 standard drinks of alcohol    Types: 3 Shots of liquor per week   Drug use: Not Currently   Sexual activity: Not Currently  Other Topics Concern   Not on file  Social History Narrative   Engaged, plan to marry in March 2021, been together since 2014.  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    [employer].   Most exercise on the job, working 7 days per week.   No children, but fiance has 5 children.    11/2019   Social Drivers of Health   Financial Resource Strain: Not on file  Food Insecurity: No Food Insecurity (01/06/2024)   Hunger Vital Sign    Worried About Running Out of Food in the Last Year: Never true    Ran Out of Food in the Last Year: Never true  Transportation Needs: No Transportation Needs (01/06/2024)   PRAPARE - Administrator, Civil Service (Medical): No    Lack of Transportation (Non-Medical): No  Physical Activity: Not on file   Stress: Not on file  Social Connections: Socially Isolated (01/03/2024)   Social Connection and Isolation Panel [NHANES]    Frequency of Communication with Friends and Family: Never    Frequency of Social Gatherings with Friends and Family: Never    Attends Religious Services: Never    Database administrator or Organizations: No    Attends Banker Meetings: Never    Marital Status: Married  Catering manager Violence: Not At Risk (01/06/2024)   Humiliation, Afraid, Rape, and Kick questionnaire    Fear of Current or Ex-Partner: No    Emotionally Abused: No    Physically Abused: No    Sexually Abused: No    FAMILY HISTORY: Family History  Problem Relation Age of Onset   Cancer Mother        lung; former smoker   COPD Father        emphysema   Diabetes Maternal Aunt    Heart disease Maternal Uncle    Heart disease Maternal Uncle  Diabetes Maternal Aunt    Stroke Neg Hx    Hyperlipidemia Neg Hx    Hypertension Neg Hx     ALLERGIES:  has no known allergies.  MEDICATIONS:  Current Outpatient Medications  Medication Sig Dispense Refill   acetaminophen (TYLENOL) 500 MG tablet Take 2 tablets (1,000 mg total) by mouth every 6 (six) hours as needed. 30 tablet 0   benzonatate (TESSALON) 200 MG capsule Take 1 capsule (200 mg total) by mouth 3 (three) times daily as needed for cough. 30 capsule 0   lidocaine-prilocaine (EMLA) cream Apply to affected area once 30 g 3   omeprazole (PRILOSEC) 40 MG capsule Take 40 mg by mouth daily. (Patient not taking: Reported on 01/02/2024)     ondansetron (ZOFRAN) 8 MG tablet Take 1 tablet (8 mg total) by mouth every 8 (eight) hours as needed for nausea or vomiting. Start on the third day after chemotherapy. 30 tablet 1   oxyCODONE (OXY IR/ROXICODONE) 5 MG immediate release tablet Take 1 tablet (5 mg total) by mouth every 4 (four) hours as needed for severe pain (pain score 7-10). 60 tablet 0   predniSONE (DELTASONE) 20 MG tablet 3 tablets  today, 2 tablets tomorrow, 1 tablet the third day 6 tablet 0   prochlorperazine (COMPAZINE) 10 MG tablet TAKE 1 TABLET(10 MG) BY MOUTH EVERY 6 HOURS AS NEEDED FOR NAUSEA OR VOMITING 30 tablet 1   valsartan (DIOVAN) 40 MG tablet Take 1 tablet (40 mg total) by mouth daily. (Patient not taking: Reported on 01/02/2024) 30 tablet 2   No current facility-administered medications for this visit.    REVIEW OF SYSTEMS:   Constitutional: ( - ) fevers, ( - )  chills , ( - ) night sweats Eyes: ( - ) blurriness of vision, ( - ) double vision, ( - ) watery eyes Ears, nose, mouth, throat, and face: ( - ) mucositis, ( - ) sore throat Respiratory: ( - ) cough, ( - ) dyspnea, ( - ) wheezes Cardiovascular: ( - ) palpitation, ( - ) chest discomfort, ( - ) lower extremity swelling Gastrointestinal:  ( - ) nausea, ( - ) heartburn, ( - ) change in bowel habits Skin: ( - ) abnormal skin rashes Lymphatics: ( - ) new lymphadenopathy, ( - ) easy bruising Neurological: ( - ) numbness, ( - ) tingling, ( - ) new weaknesses Behavioral/Psych: ( - ) mood change, ( - ) new changes  All other systems were reviewed with the patient and are negative.  PHYSICAL EXAMINATION: ECOG PERFORMANCE STATUS: 1 - Symptomatic but completely ambulatory  Vitals:   01/17/24 1219  BP: 116/78  Pulse: 98  Resp: 16  Temp: 97.7 F (36.5 C)  SpO2: 100%      Filed Weights   01/17/24 1219  Weight: 233 lb (105.7 kg)       GENERAL: well appearing middle aged Caucasian male in NAD  SKIN: skin color, texture, turgor are normal, no rashes or significant lesions EYES: conjunctiva are pink and non-injected, sclera clear LUNGS: clear to auscultation and percussion with normal breathing effort HEART: regular rate & rhythm and no murmurs and +1 pitting edema in lower extremity bilaterally.  Musculoskeletal: no cyanosis of digits and no clubbing  PSYCH: alert & oriented x 3, fluent speech NEURO: no focal motor/sensory  deficits  LABORATORY DATA:  I have reviewed the data as listed    Latest Ref Rng & Units 01/17/2024    1:01 PM 01/16/2024   11:00  AM 01/10/2024   10:17 AM  CBC  WBC 4.0 - 10.5 K/uL 15.3  14.2  15.6   Hemoglobin 13.0 - 17.0 g/dL 8.4  8.5  8.8   Hematocrit 39.0 - 52.0 % 25.4  25.4  27.5   Platelets 150 - 400 K/uL 435  468  537        Latest Ref Rng & Units 01/17/2024    1:01 PM 01/16/2024   11:00 AM 01/10/2024   10:17 AM  CMP  Glucose 70 - 99 mg/dL 119  147  94   BUN 8 - 23 mg/dL 36  35  22   Creatinine 0.61 - 1.24 mg/dL 8.29  5.62  1.30   Sodium 135 - 145 mmol/L 136  135  138   Potassium 3.5 - 5.1 mmol/L 4.2  4.3  4.5   Chloride 98 - 111 mmol/L 101  100  102   CO2 22 - 32 mmol/L 24  25  25    Calcium 8.9 - 10.3 mg/dL 8.1  8.2  8.5   Total Protein 6.5 - 8.1 g/dL 5.6  5.6  6.2   Total Bilirubin 0.0 - 1.2 mg/dL 8.5  9.1  3.7   Alkaline Phos 38 - 126 U/L 625  643  962   AST 15 - 41 U/L 104  123  199   ALT 0 - 44 U/L 64  70  70    RADIOGRAPHIC STUDIES: I have personally reviewed the radiological images as listed and agreed with the findings in the report: Marked response in the patient's lymphadenopathy and decrease in his FDG avidity.  Bone marrow involvement also appears considerably less.  Overall consistent with a partial response.  CT ABDOMEN PELVIS W CONTRAST Result Date: 01/09/2024 CLINICAL DATA:  History of colon carcinoma with abdominal bloating, initial encounter EXAM: CT ABDOMEN AND PELVIS WITH CONTRAST TECHNIQUE: Multidetector CT imaging of the abdomen and pelvis was performed using the standard protocol following bolus administration of intravenous contrast. RADIATION DOSE REDUCTION: This exam was performed according to the departmental dose-optimization program which includes automated exposure control, adjustment of the mA and/or kV according to patient size and/or use of iterative reconstruction technique. CONTRAST:  OMNIPAQUE IOHEXOL 350 MG/ML SOLN COMPARISON:  PET-CT  from 12/28/2023 FINDINGS: Lower chest: Lung bases demonstrate a few tiny nodules within the right lower lobe. These were shown to be non metabolic on recent PET-CT. No other focal abnormality is noted. Hepatobiliary: Liver again demonstrates innumerable metastatic lesions throughout the liver. The overall appearance is similar to that seen on the prior PET-CT. Largest of these lesions again lies within the left lobe of the liver measuring up to 12 cm in AP dimension. This is slightly increased from a prior CT from 12/18/2023 at which time it measured 10.7 cm. The gallbladder is within normal limits. Pancreas: Unremarkable. No pancreatic ductal dilatation or surrounding inflammatory changes. Spleen: Normal in size without focal abnormality. Adrenals/Urinary Tract: Adrenal glands are within normal limits. The kidneys demonstrate a normal enhancement pattern bilaterally. No renal calculi or obstructive changes are seen. The bladder is decompressed. Stomach/Bowel: Colon is predominately decompressed. A mildly hyperdense lesion is noted within the cecum measuring up to 2.4 cm similar to that seen on recent PET-CT again likely representing the primary colonic neoplasm. Stomach is decompressed. Small bowel is within normal limits. Vascular/Lymphatic: Aortic atherosclerotic calcifications are noted. Scattered lymph nodes are again noted in the right lower quadrant consistent with metastatic adenopathy. Prominent lymph nodes are noted in  the inguinal regions bilaterally worse on the left than the right again suggestive of metastatic disease. Stable left para-aortic adenopathy is noted. Reproductive: Prostate is unremarkable. Other: Mild free fluid is noted within the pelvis increased from previous exams. Musculoskeletal: Bony structures are within normal limits. IMPRESSION: Changes consistent with the known history of metastatic colon carcinoma with innumerable hepatic lesions. Slight increase in size is noted in the left  lobe as described. Hyperdense lesion in the cecum which may represent the primary neoplasm. Scattered adenopathy is noted in the right lower quadrant, inguinal regions and left Peri aortic region stable in appearance from the recent PET-CT. Mild ascites. Tiny nodules within the right lower lobe stable in appearance from the prior CT. These were not hypermetabolic on prior PET-CT but may be too small for adequate characterization. Monitoring on subsequent follow-up exams is recommended. Electronically Signed   By: Alcide Clever M.D.   On: 01/09/2024 19:24   DG Chest Port 1 View Result Date: 01/02/2024 CLINICAL DATA:  Sepsis.  Fever. EXAM: PORTABLE CHEST 1 VIEW COMPARISON:  12/17/2023 FINDINGS: The right IJ Port-A-Cath is stable The cardiac silhouette, mediastinal and hilar contours are within normal limits. The lungs are clear of an acute process. No infiltrates or effusions. No pulmonary lesions. The bony thorax is intact. IMPRESSION: No acute cardiopulmonary findings. Electronically Signed   By: Rudie Meyer M.D.   On: 01/02/2024 21:48   NM PET Image Initial (PI) Skull Base To Thigh (F-18 FDG) Result Date: 12/29/2023 CLINICAL DATA:  Subsequent treatment strategy for colorectal carcinoma. History of lymphoma. EXAM: NUCLEAR MEDICINE PET SKULL BASE TO THIGH TECHNIQUE: 11.9 mCi F-18 FDG was injected intravenously. Full-ring PET imaging was performed from the skull base to thigh after the radiotracer. CT data was obtained and used for attenuation correction and anatomic localization. Fasting blood glucose: 90 mg/dl COMPARISON:  CT 16/11/930, DOTATATE PET-CT 04/21/2023 FINDINGS: NECK: focal metabolic lesion in the RIGHT parotid gland is likely a primary parotid neoplasm. Incidental CT findings: None. CHEST: No hypermetabolic mediastinal or hilar nodes. No suspicious pulmonary nodules on the CT scan. Incidental CT findings: Port in the anterior chest wall with tip in distal SVC. ABDOMEN/PELVIS: Innumerable round  intensely hypermetabolic lesions throughout the LEFT and RIGHT hepatic lobes. These correspond to the hypoenhancing lesion on comparison CT. Lesions are intense. For example RIGHT hepatic lobe lesion with SUV max equal 14.2. LEFT hepatic lobe lesion with SUV max equal 14.4 There is hypermetabolic activity in the cecum on image 167 with SUV max equal 14.8. There is a hypermetabolic lymph node adjacent to the cecum along the medial border measuring 14 mm on image 163 with SUV max equal 6.7. Smaller hypermetabolic lymph node in the ileocecal mesentery on image 157 measures 9 mm with SUV max equal 5.8. There is a cluster of hypermetabolic lymph nodes in the LEFT inguinal nodal station with SUV max equal 7.2 on image 203. Incidental CT findings: None. SKELETON: No focal hypermetabolic activity to suggest skeletal metastasis. Incidental CT findings: None. IMPRESSION: 1. Innumerable hypermetabolic liver lesions consistent with hepatic metastasis. 2. Hypermetabolic cecal mass concerning for primary colorectal carcinoma. 3. Hypermetabolic lymph nodes adjacent to the cecum and in the ileocecal mesentery consistent with nodal metastasis. 4. Hypermetabolic lymph nodes in the LEFT inguinal nodal station consistent with nodal metastasis. 5. No evidence of metastatic adenopathy in the chest. 6. 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 Electronically Signed   By: Loura Halt.D.  On: 12/29/2023 10:24    ASSESSMENT & PLAN LOC FEINSTEIN 68 y.o. male with medical history significant for diagnosed follicular lymphoma who presents for a follow up visit.   He completed 6 cycles of Bendamustine/Rituximab therapy from 04/30/2020-09/17/2020. Repeat PET CT scan on 10/26/2020 showed a partial response. OK to enter observation.   CT scan from 04/30/2022 and doppler US from 05/05/2022 showed enlarging adenopathy. Recommended second line therapy with R-CHOP. Regimen consists of  Rituximab 375 mg/m2, cytoxan 750 mg/m2, vincristine 2 mg IV, adriamycin 50 mg/m2 along with prednisone 60 mg PO on days 1-5. This regimen is given every 21 days.   # Follicular Lymphoma. Stage III --patient completed Cycle 6 of R-Benda therapy. This was administered with curative intent.  --PET CT scan after 6 cycles of R-Benda showed substantial partial response. Residual mild hypermetabolism within the retroperitoneal and left common iliac chains (Deauville score 3). No residual metabolically active adenopathy in the neck or chest --per [place] Criteria, patient is a Partial Response.  --CT neck and CAP from 04/30/2022 showed progressive adenopathy.  --Doppler  US from 05/05/2022 showed progressive inguinal adenopathy, bilateral. --Recommend systemic therapy with R-CHOP started on 06/29/2022 ----Baseline ECHO from 06/10/2022 shows EF 60-65% Plan:  --patient completed 6 cycles of R-CHOP --Labs from today were reviewed and show white blood cell 15.3, hemoglobin 8.4, MCV 76.5, platelets 435 -- Findings appear to show stable lymphadenopathy but increased metastatic disease to the liver.  Found to be metastatic colon cancer, noted below.  # Metastatic Colon Cancer  -- Prior CT findings showed concern for metastatic disease to the liver.  Biopsy showed metastatic colon cancer. -- PET CT scan ordered and did confirm large cecal mass, likely the primary colon cancer. -- Discussed with gastroenterology.  Will hold off on colonoscopy at this time as we have tissue for NGS testing.  In the event we do not have adequate tissue for testing would recommend colonoscopy. PLAN: -- Patient is agreeable to transitioning to comfort based care/hospice -- Rapidly worsening liver enzymes and discomfort.  Patient is jaundiced and has elevated bilirubin -- Return to clinic on an as-needed basis moving forward.  #Anemia #Dark Stools -- Most likely to be connected to his apparent progression of disease, though GI bleed  cannot be ruled out given his dark stools. -- Upper endoscopy did not show any evidence of GI bleed.  Suspect bleeding is coming from the patient's cecal mass.  # Well Differentiated Neuroendocrine Tumor -- Continue to monitor in conjunction with his follicular lymphoma. -- No elevations in chromogranin or evidence of more disease on PET dotatate  # Left Lower Leg Swelling, improved --Patient has no more redness and +1 pitting edema of left lower extremity. Swelling L>R --Doppler US from 05/05/2022 showed no evidence of DVT. There are enlarged lymph nodes noted in the groin, bilaterally which is likely causing the swelling.   #Supportive Care -- port placed -- zofran 8mg  q8H PRN and compazine 10mg  PO q6H for nausea -- EMLA cream for port -- no pain medication required at this time.   No orders of the defined types were placed in this encounter.   All questions were answered. The patient knows to call the clinic with any problems, questions or concerns.  I have spent a total of 30 minutes minutes of face-to-face and non-face-to-face time, preparing to see the patient,  performing a medically appropriate examination, counseling and educating the patient, ordering medications, documenting clinical information in the electronic health record, and care  coordination.   Ulysees Barns, MD Department of Hematology/Oncology [facility] Phone: [phone] Pager: [phone] Email: [email or web address]  01/22/2024 12:54 PM

## 2024-01-17 NOTE — Patient Instructions (Signed)
Rehydration, Adult  Rehydration is the replacement of fluids, salts, and minerals in the body (electrolytes) that are lost during dehydration. Dehydration is when there is not enough water or other fluids in the body. This happens when you lose more fluids than you take in. People who are age 70 or older have a higher risk of dehydration than younger adults. This is because in older age, the body: Is less able to maintain the right amount of water. Does not respond to temperature changes as well. Does not get a sense of thirst as easily or quickly. Other causes include: Not drinking enough fluids. This can occur when you are ill, when you forget to drink, or when you are doing activities that require a lot of energy, especially in hot weather. Conditions that cause loss of water or other fluids. These include diarrhea, vomiting, sweating, or urinating a lot. Other illnesses, such as fever or infection. Certain medicines, such as those that remove excess fluid from the body (diuretics). Symptoms of mild or moderate dehydration may include thirst, dry lips and mouth, and dizziness. Symptoms of severe dehydration may include increased heart rate, confusion, fainting, and not urinating. In severe cases, you may need to get fluids through an IV at the hospital. For mild or moderate cases, you can usually rehydrate at home by drinking certain fluids as told by your health care provider. What are the risks? Rehydration is usually safe. Taking in too much fluid (overhydration) can be a problem but is rare. Overhydration can cause an imbalance of electrolytes in the body, kidney failure, fluid in the lungs, or a decrease in salt (sodium) levels in the body. Supplies needed: You will need an oral rehydration solution (ORS) if your health care provider tells you to use one. This is a drink to treat dehydration. It can be found in pharmacies and retail stores. How to rehydrate Fluids Follow instructions from  your health care provider about what to drink. The kind of fluid and the amount you should drink depend on your condition. In general, you should choose drinks that you prefer. If told by your health care provider, drink an ORS. Make an ORS by following instructions on the package. Start by drinking small amounts, about  cup (120 mL) every 5-10 minutes. Slowly increase how much you drink until you have taken in the amount recommended by your health care provider. Drink enough clear fluids to keep your urine pale yellow. If you were told to drink an ORS, finish it first, then start slowly drinking other clear fluids. Drink fluids such as: Water. This includes sparkling and flavored water. Drinking only water can lead to having too little sodium in your body (hyponatremia). Follow the advice of your health care provider. Water from ice chips you suck on. Fruit juice with water added to it(diluted). Sports drinks. Hot or cold herbal teas. Broth-based soups. Coffee. Milk or milk products. Food Follow instructions from your health care provider about what to eat while you rehydrate. Your health care provider may recommend that you slowly begin eating regular foods in small amounts. Eat foods that contain a healthy balance of electrolytes, such as bananas, oranges, potatoes, tomatoes, and spinach. Avoid foods that are greasy or contain a lot of sugar. In some cases, you may get nutrition through a feeding tube that is passed through your nose and into your stomach (nasogastric tube, or NG tube). This may be done if you have uncontrolled vomiting or diarrhea. Drinks to avoid  Certain drinks may make dehydration worse. While you rehydrate, avoid drinking alcohol. How to tell if you are recovering from dehydration You may be getting better if: You are urinating more often than before you started rehydrating. Your urine is pale yellow. Your energy level improves. You vomit less often. You have  diarrhea less often. Your appetite improves or returns to normal. You feel less dizzy or light-headed. Your skin tone and color start to look more normal. Follow these instructions at home: Take over-the-counter and prescription medicines only as told by your health care provider. Do not take sodium tablets. Doing this can lead to having too much sodium in your body (hypernatremia). Contact a health care provider if: You continue to have symptoms of mild or moderate dehydration, such as: Thirst. Dry lips. Slightly dry mouth. Dizziness. Dark urine or less urine than usual. Muscle cramps. You continue to vomit or have diarrhea. Get help right away if: You have symptoms of dehydration that get worse. You have a fever. You have a severe headache. You have been vomiting and have problems, such as: Your vomiting gets worse. Your vomit includes blood or green matter (bile). You cannot eat or drink without vomiting. You have problems with urination or bowel movements, such as: Diarrhea that gets worse. Blood in your stool (feces). This may cause stool to look black and tarry. Not urinating, or urinating only a small amount of very dark urine, within 6-8 hours. You have trouble breathing. You have symptoms that get worse with treatment. These symptoms may be an emergency. Get help right away. Call 911. Do not wait to see if the symptoms will go away. Do not drive yourself to the hospital. This information is not intended to replace advice given to you by your health care provider. Make sure you discuss any questions you have with your health care provider. Document Revised: 03/10/2022 Document Reviewed: 03/08/2022 Elsevier Patient Education  2024 [employer].

## 2024-01-18 ENCOUNTER — Ambulatory Visit: Payer: Medicare Other

## 2024-01-18 ENCOUNTER — Encounter: Payer: Self-pay | Admitting: Hematology and Oncology

## 2024-01-18 ENCOUNTER — Other Ambulatory Visit: Payer: Medicare Other

## 2024-01-18 ENCOUNTER — Ambulatory Visit: Payer: Medicare Other | Admitting: Hematology and Oncology

## 2024-01-20 ENCOUNTER — Other Ambulatory Visit: Payer: Self-pay | Admitting: Hematology and Oncology

## 2024-01-20 DIAGNOSIS — C787 Secondary malignant neoplasm of liver and intrahepatic bile duct: Secondary | ICD-10-CM

## 2024-01-22 ENCOUNTER — Encounter: Payer: Self-pay | Admitting: Hematology and Oncology

## 2024-01-23 ENCOUNTER — Telehealth: Payer: Self-pay | Admitting: *Deleted

## 2024-01-23 ENCOUNTER — Other Ambulatory Visit: Payer: Self-pay | Admitting: Hematology and Oncology

## 2024-01-23 MED ORDER — ZOLPIDEM TARTRATE 5 MG PO TABS: 5.0000 mg | ORAL_TABLET | Freq: Every evening | ORAL | 0 refills | Status: DC | PRN

## 2024-01-23 NOTE — Telephone Encounter (Signed)
 Received call from pt requesting IV fluids. He states he cannot eat or drink and feels bad. Call made to Hospice Liaison, Haynes Bast, RN about this as pt is with Hospice.  She states she will call his case manager about this need. It is a symptom management concern. Received call back from [place]. She was able to get orders for IV fluids at home today. TCT pt's wife, Tyler Aas. Spoke with her and advised that  Hospice is agreeable to giving pt fluids at home today. Advised that they will call before they come over. Advised that this will keep him from having to come here to the office when he feels so bad. Tyler Aas is very Adult nurse of this.

## 2024-01-24 ENCOUNTER — Other Ambulatory Visit: Payer: Self-pay

## 2024-01-24 ENCOUNTER — Emergency Department (HOSPITAL_COMMUNITY)
Admission: EM | Admit: 2024-01-24 | Discharge: 2024-01-24 | Disposition: A | Discharge status: Home / Self Care | Attending: Emergency Medicine | Admitting: Emergency Medicine

## 2024-01-24 ENCOUNTER — Encounter (HOSPITAL_COMMUNITY): Payer: Self-pay | Admitting: Emergency Medicine

## 2024-01-24 DIAGNOSIS — R112 Nausea with vomiting, unspecified: Secondary | ICD-10-CM | POA: Diagnosis not present

## 2024-01-24 DIAGNOSIS — R000 Tachycardia, unspecified: Secondary | ICD-10-CM | POA: Diagnosis not present

## 2024-01-24 LAB — CBC
HCT: 32.5 % — ABNORMAL LOW (ref 39.0–52.0)
Hemoglobin: 10.1 g/dL — ABNORMAL LOW (ref 13.0–17.0)
MCH: 28.6 pg (ref 26.0–34.0)
MCHC: 31.1 g/dL (ref 30.0–36.0)
MCV: 92.1 fL (ref 80.0–100.0)
Platelets: 671 10*3/uL — ABNORMAL HIGH (ref 150–400)
RBC: 3.53 MIL/uL — ABNORMAL LOW (ref 4.22–5.81)
RDW: 32.4 % — ABNORMAL HIGH (ref 11.5–15.5)
WBC: 11.3 10*3/uL — ABNORMAL HIGH (ref 4.0–10.5)
nRBC: 0 % (ref 0.0–0.2)

## 2024-01-24 NOTE — ED Triage Notes (Signed)
 Pt arrives via EMS from home with hospice care with c/o N/V approx 2 hrs ago. 4mg  zofran given en route. Hx Liver Cancer.

## 2024-01-24 NOTE — ED Provider Notes (Signed)
 [place] EMERGENCY DEPARTMENT AT [facility] Provider Note   CSN: [id number] Arrival date & time: 01/24/24  1901     History  Chief Complaint  Patient presents with   Emesis    Jorge Gould is a 68 y.o. male.  With a history of metastatic colon carcinoma on hospice care who presents to the ED for vomiting.  Nausea vomiting began at home approximately 2 hours prior to arrival.  Improved with Zofran given by EMS.  Currently on hospice care at home.  Denies fevers chills and is requesting discharge at this time   Emesis      Home Medications Prior to Admission medications   Medication Sig Start Date End Date Taking? Authorizing Provider  acetaminophen (TYLENOL) 500 MG tablet Take 2 tablets (1,000 mg total) by mouth every 6 (six) hours as needed. 04/21/22   Carlisle Beers, FNP  benzonatate (TESSALON) 200 MG capsule Take 1 capsule (200 mg total) by mouth 3 (three) times daily as needed for cough. 12/13/23   Tysinger, Kermit Balo, PA-C  lidocaine-prilocaine (EMLA) cream Apply to affected area once 01/10/24   Jaci Standard, MD  omeprazole (PRILOSEC) 40 MG capsule Take 40 mg by mouth daily. Patient not taking: Reported on 01/02/2024 12/22/23   [provider]  ondansetron (ZOFRAN) 8 MG tablet Take 1 tablet (8 mg total) by mouth every 8 (eight) hours as needed for nausea or vomiting. Start on the third day after chemotherapy. 01/10/24   Jaci Standard, MD  oxyCODONE (OXY IR/ROXICODONE) 5 MG immediate release tablet Take 1 tablet (5 mg total) by mouth every 4 (four) hours as needed for severe pain (pain score 7-10). 12/30/23   Jaci Standard, MD  predniSONE (DELTASONE) 20 MG tablet 3 tablets today, 2 tablets tomorrow, 1 tablet the third day 12/13/23   Tysinger, Kermit Balo, PA-C  prochlorperazine (COMPAZINE) 10 MG tablet TAKE 1 TABLET(10 MG) BY MOUTH EVERY 6 HOURS AS NEEDED FOR NAUSEA OR VOMITING 01/20/24   Jaci Standard, MD  valsartan (DIOVAN) 40 MG tablet Take 1  tablet (40 mg total) by mouth daily. Patient not taking: Reported on 01/02/2024 12/13/23 12/12/24  Tysinger, Kermit Balo, PA-C  zolpidem (AMBIEN) 5 MG tablet Take 1 tablet (5 mg total) by mouth at bedtime as needed for sleep. 01/23/24   Jaci Standard, MD      Allergies    Patient has no known allergies.    Review of Systems   Review of Systems  Gastrointestinal:  Positive for vomiting.    Physical Exam Updated Vital Signs BP (!) 133/90 (BP Location: Left Arm)   Pulse (!) 103   Temp 99.9 F (37.7 C) (Axillary)   Resp 16   Ht 5\' 9"  (1.753 m)   Wt 105 kg   SpO2 100%   BMI 34.18 kg/m  Physical Exam Vitals and nursing note reviewed.  HENT:     Head: Normocephalic and atraumatic.  Eyes:     General: Scleral icterus present.     Pupils: Pupils are equal, round, and reactive to light.  Cardiovascular:     Rate and Rhythm: Normal rate and regular rhythm.  Pulmonary:     Effort: Pulmonary effort is normal.     Breath sounds: Normal breath sounds.  Abdominal:     General: There is distension.     Palpations: Abdomen is soft.     Tenderness: There is no abdominal tenderness.  Skin:  General: Skin is warm and dry.     Coloration: Skin is jaundiced.  Neurological:     Mental Status: He is alert.  Psychiatric:        Mood and Affect: Mood normal.     ED Results / Procedures / Treatments   Labs (all labs ordered are listed, but only abnormal results are displayed) Labs Reviewed  CBC - Abnormal; Notable for the following components:      Result Value   WBC 11.3 (*)    RBC 3.53 (*)    Hemoglobin 10.1 (*)    HCT 32.5 (*)    RDW 32.4 (*)    Platelets 671 (*)    All other components within normal limits  CULTURE, BLOOD (ROUTINE X 2)  CULTURE, BLOOD (ROUTINE X 2)  LACTIC ACID, PLASMA  LACTIC ACID, PLASMA  COMPREHENSIVE METABOLIC PANEL  LIPASE, BLOOD    EKG None  Radiology No results found.  Procedures Procedures    Medications Ordered in ED Medications - No  data to display  ED Course/ Medical Decision Making/ A&P                                 Medical Decision Making 68 year old male with history as above presenting for acute nausea and vomiting.  Nausea and vomiting has resolved since receiving Zofran from EMS prior to arrival.  He is a hospice patient given metastatic colon cancer with mets to the liver.  Chronic nausea and vomiting in the setting of metastatic disease for which she is prescribed Zofran and Compazine.  We have obtained basic laboratory workup but he is requesting discharge back home as he does not want to stay in the hospital and his nausea and vomiting have improved.  I do feel like this is a reasonable request and he understands the risks of leaving before the results of his workup is back.  Stable for discharge  Amount and/or Complexity of Data Reviewed Labs: ordered.           Final Clinical Impression(s) / ED Diagnoses Final diagnoses:  Nausea and vomiting, unspecified vomiting type    Rx / DC Orders ED Discharge Orders     None         Anothy, Bufano, DO 01/24/24 2106

## 2024-01-24 NOTE — Discharge Instructions (Signed)
 You were seen in the Emergency Department for nausea and vomiting Your nausea and vomiting improved while you were here and you did not wish to stay for the final results of your blood work which is okay as long as you follow-up on the results at home Please follow-up with your primary care doctor Return to the emerged part for uncontrolled vomiting, abdominal pain or any other concerns

## 2024-01-24 NOTE — ED Notes (Signed)
 Pt refusing further blood work and states he is ready to go home. Pt states he does not want to wait for Dr or lab work to come back and would like to go home. Dr Elayne Snare made aware and to bedside. IV removed and tele discontinued per pt request. Awaiting wife to transport pt home.

## 2024-01-25 ENCOUNTER — Inpatient Hospital Stay: Payer: Medicare Other

## 2024-01-25 ENCOUNTER — Inpatient Hospital Stay: Payer: Medicare Other | Admitting: Hematology and Oncology

## 2024-01-30 ENCOUNTER — Other Ambulatory Visit: Payer: Medicare Other

## 2024-02-02 ENCOUNTER — Other Ambulatory Visit: Payer: Self-pay | Admitting: Hematology and Oncology

## 2024-02-02 DIAGNOSIS — C189 Malignant neoplasm of colon, unspecified: Secondary | ICD-10-CM

## 2024-02-07 DEATH — deceased

## 2024-02-08 ENCOUNTER — Encounter: Admitting: Dietician

## 2024-02-08 ENCOUNTER — Other Ambulatory Visit: Payer: Medicare Other

## 2024-02-08 ENCOUNTER — Ambulatory Visit: Payer: Medicare Other

## 2024-02-08 ENCOUNTER — Ambulatory Visit: Payer: Medicare Other | Admitting: Hematology and Oncology

## 2024-02-13 ENCOUNTER — Other Ambulatory Visit: Payer: Medicare Other

## 2024-03-09 ENCOUNTER — Other Ambulatory Visit: Payer: Self-pay | Admitting: Medical

## 2024-07-31 ENCOUNTER — Other Ambulatory Visit (HOSPITAL_COMMUNITY): Payer: Self-pay
